# Patient Record
Sex: Male | Born: 1965 | Race: Black or African American | Hispanic: No | Marital: Married | Smoking: Former smoker
Health system: Southern US, Community
[De-identification: ages and names within clinical notes are randomized; demographics above are authoritative.]

## PROBLEM LIST (undated history)

## (undated) DIAGNOSIS — K219 Gastro-esophageal reflux disease without esophagitis: Secondary | ICD-10-CM

## (undated) DIAGNOSIS — I10 Essential (primary) hypertension: Secondary | ICD-10-CM

## (undated) DIAGNOSIS — E119 Type 2 diabetes mellitus without complications: Secondary | ICD-10-CM

## (undated) DIAGNOSIS — E7800 Pure hypercholesterolemia, unspecified: Secondary | ICD-10-CM

## (undated) HISTORY — PX: COLONOSCOPY: SHX174

---

## 2011-05-16 HISTORY — PX: INGUINAL HERNIA REPAIR: SUR1180

## 2012-07-02 ENCOUNTER — Emergency Department (HOSPITAL_COMMUNITY)
Admission: EM | Admit: 2012-07-02 | Discharge: 2012-07-02 | Disposition: A | Discharge status: Home / Self Care | Payer: BC Managed Care – PPO | Attending: Emergency Medicine | Admitting: Emergency Medicine

## 2012-07-02 ENCOUNTER — Encounter (HOSPITAL_COMMUNITY): Payer: Self-pay | Admitting: *Deleted

## 2012-07-02 DIAGNOSIS — Z87891 Personal history of nicotine dependence: Secondary | ICD-10-CM | POA: Insufficient documentation

## 2012-07-02 DIAGNOSIS — N492 Inflammatory disorders of scrotum: Secondary | ICD-10-CM

## 2012-07-02 DIAGNOSIS — Z79899 Other long term (current) drug therapy: Secondary | ICD-10-CM | POA: Insufficient documentation

## 2012-07-02 DIAGNOSIS — Z7982 Long term (current) use of aspirin: Secondary | ICD-10-CM | POA: Insufficient documentation

## 2012-07-02 DIAGNOSIS — E119 Type 2 diabetes mellitus without complications: Secondary | ICD-10-CM | POA: Insufficient documentation

## 2012-07-02 DIAGNOSIS — N454 Abscess of epididymis or testis: Secondary | ICD-10-CM | POA: Insufficient documentation

## 2012-07-02 NOTE — ED Notes (Signed)
Pt with L testicular swelling x 2 weeks, but in the last 3 days it has began swelling rapidly (pt states size of an egg at present), with redness and 2 "white heads".

## 2012-07-02 NOTE — ED Provider Notes (Signed)
History    This chart was scribed for non-physician practitioner working with [name], * by Toya Smothers, ED Scribe. This patient was seen in room TR08C/TR08C and the patient's care was started at 18:42.   CSN: [id number]  Arrival date & time 07/02/12  1737   First MD Initiated Contact with Patient 07/02/12 1836      Chief Complaint  Patient presents with  . Groin Swelling    Patient is a 47 y.o. male presenting with groin pain.  Groin Pain This is a new problem. The current episode started more than 1 week ago. The problem occurs constantly. The problem has been gradually worsening. The symptoms are aggravated by walking. Nothing relieves the symptoms. He has tried nothing for the symptoms. The treatment provided no relief.    Bradley Howe is a 47 y.o. male who presents to the Emergency Department complaining of 2 weeks of new, progressive, moderate left scrotal swelling, worsening in the past 3 days. Pain is described as soreness, aggravated with movement, and alleviated by nothing. Typically healthy at baseline, CC represents a moderate deviation. Symptoms have not been treated PTA. No penile pain or swelling, dysuria, abdominal pain, or fever. Pt is sexually active with one partner, is a former smoker, and denies alcohol and illicit drug use.   Past Medical History  Diagnosis Date  . Diabetes mellitus without complication     Past Surgical History  Procedure Laterality Date  . Hernia repair  2013    inguinal hernia    No family history on file.  History  Substance Use Topics  . Smoking status: Former Games developer  . Smokeless tobacco: Not on file  . Alcohol Use: Yes     Comment: weekends      Review of Systems  Constitutional: Negative for fever.  Genitourinary: Positive for scrotal swelling. Negative for difficulty urinating and penile pain.  All other systems reviewed and are negative.    Allergies  Review of patient's allergies indicates no known  allergies.  Home Medications   Current Outpatient Rx  Name  Route  Sig  Dispense  Refill  . aspirin 81 MG chewable tablet   Oral   Chew 81 mg by mouth daily.         Marland Kitchen atorvastatin (LIPITOR) 10 MG tablet   Oral   Take 10 mg by mouth daily.         . insulin NPH-insulin regular (NOVOLIN 70/30) (70-30) 100 UNIT/ML injection   Subcutaneous   Inject 15 Units into the skin 2 (two) times daily with a meal.         . metFORMIN (GLUCOPHAGE) 500 MG tablet   Oral   Take 500 mg by mouth 2 (two) times daily with a meal.         . simvastatin (ZOCOR) 10 MG tablet   Oral   Take 10 mg by mouth at bedtime.           BP 129/78  Pulse 95  Temp(Src) 98.9 F (37.2 C) (Oral)  Resp 16  SpO2 96%  Physical Exam  Nursing note and vitals reviewed. Constitutional: He is oriented to person, place, and time. He appears well-developed and well-nourished. No distress.  HENT:  Head: Normocephalic and atraumatic.  Eyes: EOM are normal.  Neck: Neck supple. No tracheal deviation present.  Cardiovascular: Normal rate.   Pulmonary/Chest: Effort normal. No respiratory distress.  Genitourinary: No penile tenderness.  A quarter sized area of induration on the left  side of the scrotum.  Musculoskeletal: Normal range of motion.  Neurological: He is alert and oriented to person, place, and time.  Skin: Skin is warm and dry.  Psychiatric: He has a normal mood and affect. His behavior is normal.    ED Course  Procedures DIAGNOSTIC STUDIES: Oxygen Saturation is 96% on room air, adequate by my interpretation.    COORDINATION OF CARE: 18:42- Evaluated Pt. Pt is awake, alert, and without distress. 18:49- Patient understand and agree with initial ED impression and plan with expectations set for ED visit.  Patient discussed with and seen by Dr. Blinda Leatherwood.  Labs Reviewed - No data to display No results found.   No diagnosis found.  INCISION AND DRAINAGE Performed by: Jimmye Norman Consent: Verbal consent obtained. Risks and benefits: risks, benefits and alternatives were discussed Type: abscess  Body area:  scrotum Anesthesia: local infiltration  Incision was made with a scalpel.  Local anesthetic: lidocaine 2%   Anesthetic total: 1 ml  Complexity: complex   Drainage: purulent  Drainage amount: copious   Patient tolerance: Patient tolerated the procedure well with no immediate complications.    MDM    I personally performed the services described in this documentation, which was scribed in my presence. The recorded information has been reviewed and is accurate.       Jimmye Norman, NP 07/02/12 2241

## 2012-07-03 NOTE — ED Provider Notes (Signed)
Medical screening examination/treatment/procedure(s) were performed by non-physician practitioner and as supervising physician I was immediately available for consultation/collaboration.  Christopher J. Pollina, MD 07/03/12 1217 

## 2012-07-26 DIAGNOSIS — I10 Essential (primary) hypertension: Secondary | ICD-10-CM | POA: Insufficient documentation

## 2012-09-20 ENCOUNTER — Encounter (HOSPITAL_COMMUNITY): Payer: Self-pay | Admitting: Emergency Medicine

## 2012-09-20 ENCOUNTER — Emergency Department (HOSPITAL_COMMUNITY)
Admission: EM | Admit: 2012-09-20 | Discharge: 2012-09-20 | Disposition: A | Discharge status: Home / Self Care | Payer: BC Managed Care – PPO | Attending: Emergency Medicine | Admitting: Emergency Medicine

## 2012-09-20 DIAGNOSIS — R5381 Other malaise: Secondary | ICD-10-CM | POA: Insufficient documentation

## 2012-09-20 DIAGNOSIS — Z794 Long term (current) use of insulin: Secondary | ICD-10-CM | POA: Insufficient documentation

## 2012-09-20 DIAGNOSIS — E162 Hypoglycemia, unspecified: Secondary | ICD-10-CM

## 2012-09-20 DIAGNOSIS — E1169 Type 2 diabetes mellitus with other specified complication: Secondary | ICD-10-CM | POA: Insufficient documentation

## 2012-09-20 DIAGNOSIS — Z7982 Long term (current) use of aspirin: Secondary | ICD-10-CM | POA: Insufficient documentation

## 2012-09-20 DIAGNOSIS — Z87891 Personal history of nicotine dependence: Secondary | ICD-10-CM | POA: Insufficient documentation

## 2012-09-20 DIAGNOSIS — Z79899 Other long term (current) drug therapy: Secondary | ICD-10-CM | POA: Insufficient documentation

## 2012-09-20 LAB — CBC WITH DIFFERENTIAL/PLATELET
Eosinophils Relative: 1 % (ref 0–5)
HCT: 38.9 % — ABNORMAL LOW (ref 39.0–52.0)
Hemoglobin: 13.6 g/dL (ref 13.0–17.0)
Lymphocytes Relative: 21 % (ref 12–46)
Lymphs Abs: 2 10*3/uL (ref 0.7–4.0)
MCH: 28.9 pg (ref 26.0–34.0)
MCV: 82.6 fL (ref 78.0–100.0)
Monocytes Absolute: 0.6 10*3/uL (ref 0.1–1.0)
Monocytes Relative: 6 % (ref 3–12)
RBC: 4.71 MIL/uL (ref 4.22–5.81)
WBC: 9.4 10*3/uL (ref 4.0–10.5)

## 2012-09-20 LAB — BASIC METABOLIC PANEL
BUN: 11 mg/dL (ref 6–23)
CO2: 22 mEq/L (ref 19–32)
Calcium: 9 mg/dL (ref 8.4–10.5)
Glucose, Bld: 89 mg/dL (ref 70–99)
Sodium: 137 mEq/L (ref 135–145)

## 2012-09-20 MED ORDER — SODIUM CHLORIDE 0.9 % IV BOLUS (SEPSIS)
1000.0000 mL | Freq: Once | INTRAVENOUS | Status: AC
  Administered 2012-09-20: 1000 mL via INTRAVENOUS

## 2012-09-20 NOTE — ED Notes (Signed)
STATES his sugars have been going up and down for no reason  X 2-3 days

## 2012-09-20 NOTE — ED Notes (Addendum)
Pt states his CBG has been going up and down. CBG 240 then dropped 35 and he was feeling weak. Pt states he takes 15 unit of insulin Novolin 70/30 x2 day. Pt states no changed in insulin or diet.

## 2012-09-20 NOTE — ED Provider Notes (Signed)
History     CSN: [id number]  Arrival date & time 09/20/12  1146   First MD Initiated Contact with Patient 09/20/12 1202      Chief Complaint  Patient presents with  . Diabetes    (Consider location/radiation/quality/duration/timing/severity/associated sxs/prior treatment) Patient is a 47 y.o. male presenting with weakness. The history is provided by the patient (pt states his glucose has been up and down). No language interpreter was used.  Weakness This is a new problem. The current episode started yesterday. The problem occurs constantly. The problem has been resolved. Pertinent negatives include no chest pain and no abdominal pain. Nothing aggravates the symptoms. Nothing relieves the symptoms.    Past Medical History  Diagnosis Date  . Diabetes mellitus without complication     Past Surgical History  Procedure Laterality Date  . Hernia repair  2013    inguinal hernia    No family history on file.  History  Substance Use Topics  . Smoking status: Former Games developer  . Smokeless tobacco: Not on file  . Alcohol Use: Yes     Comment: weekends      Review of Systems  Cardiovascular: Negative for chest pain.  Gastrointestinal: Negative for abdominal pain.  Neurological: Positive for weakness.    Allergies  Review of patient's allergies indicates no known allergies.  Home Medications   Current Outpatient Rx  Name  Route  Sig  Dispense  Refill  . aspirin 81 MG chewable tablet   Oral   Chew 81 mg by mouth daily.         Marland Kitchen atorvastatin (LIPITOR) 10 MG tablet   Oral   Take 10 mg by mouth daily.         . insulin NPH-insulin regular (NOVOLIN 70/30) (70-30) 100 UNIT/ML injection   Subcutaneous   Inject 15 Units into the skin 2 (two) times daily with a meal.         . metFORMIN (GLUCOPHAGE) 500 MG tablet   Oral   Take 500 mg by mouth 2 (two) times daily with a meal.         . simvastatin (ZOCOR) 10 MG tablet   Oral   Take 10 mg by mouth daily.           BP 112/75  Pulse 84  Temp(Src) 97.7 F (36.5 C)  Resp 16  SpO2 95%  Physical Exam  Constitutional: He is oriented to person, place, and time. He appears well-developed.  HENT:  Head: Normocephalic.  Eyes: Conjunctivae and EOM are normal. No scleral icterus.  Neck: Neck supple. No thyromegaly present.  Cardiovascular: Normal rate and regular rhythm.  Exam reveals no gallop and no friction rub.   No murmur heard. Pulmonary/Chest: No stridor. He has no wheezes. He has no rales. He exhibits no tenderness.  Abdominal: He exhibits no distension. There is no tenderness. There is no rebound.  Musculoskeletal: Normal range of motion. He exhibits no edema.  Lymphadenopathy:    He has no cervical adenopathy.  Neurological: He is oriented to person, place, and time. Coordination normal.  Skin: No rash noted. No erythema.  Psychiatric: He has a normal mood and affect. His behavior is normal.    ED Course  Procedures (including critical care time)  Labs Reviewed  CBC WITH DIFFERENTIAL - Abnormal; Notable for the following:    HCT 38.9 (*)    All other components within normal limits  BASIC METABOLIC PANEL - Abnormal; Notable for the following:  Potassium 3.3 (*)    GFR calc non Af Amer 62 (*)    GFR calc Af Amer 72 (*)    All other components within normal limits  GLUCOSE, CAPILLARY - Abnormal; Notable for the following:    Glucose-Capillary 103 (*)    All other components within normal limits  GLUCOSE, CAPILLARY   No results found.   1. Hypoglycemia       MDM          Benny Lennert, MD 09/20/12 1404

## 2012-09-20 NOTE — ED Notes (Signed)
CBG: 115 

## 2012-09-23 LAB — GLUCOSE, CAPILLARY: Glucose-Capillary: 115 mg/dL — ABNORMAL HIGH (ref 70–99)

## 2013-01-11 ENCOUNTER — Encounter (HOSPITAL_COMMUNITY): Payer: Self-pay

## 2013-01-11 ENCOUNTER — Emergency Department (HOSPITAL_COMMUNITY)
Admission: EM | Admit: 2013-01-11 | Discharge: 2013-01-11 | Disposition: A | Discharge status: Home / Self Care | Payer: Self-pay | Attending: Emergency Medicine | Admitting: Emergency Medicine

## 2013-01-11 DIAGNOSIS — R739 Hyperglycemia, unspecified: Secondary | ICD-10-CM

## 2013-01-11 DIAGNOSIS — R7309 Other abnormal glucose: Secondary | ICD-10-CM | POA: Insufficient documentation

## 2013-01-11 DIAGNOSIS — Z7982 Long term (current) use of aspirin: Secondary | ICD-10-CM | POA: Insufficient documentation

## 2013-01-11 DIAGNOSIS — R351 Nocturia: Secondary | ICD-10-CM | POA: Insufficient documentation

## 2013-01-11 DIAGNOSIS — Z794 Long term (current) use of insulin: Secondary | ICD-10-CM | POA: Insufficient documentation

## 2013-01-11 DIAGNOSIS — R631 Polydipsia: Secondary | ICD-10-CM | POA: Insufficient documentation

## 2013-01-11 DIAGNOSIS — Z79899 Other long term (current) drug therapy: Secondary | ICD-10-CM | POA: Insufficient documentation

## 2013-01-11 DIAGNOSIS — R350 Frequency of micturition: Secondary | ICD-10-CM | POA: Insufficient documentation

## 2013-01-11 DIAGNOSIS — Z87891 Personal history of nicotine dependence: Secondary | ICD-10-CM | POA: Insufficient documentation

## 2013-01-11 LAB — URINALYSIS, ROUTINE W REFLEX MICROSCOPIC
Bilirubin Urine: NEGATIVE
Hgb urine dipstick: NEGATIVE
Specific Gravity, Urine: 1.037 — ABNORMAL HIGH (ref 1.005–1.030)
pH: 6 (ref 5.0–8.0)

## 2013-01-11 LAB — CBC WITH DIFFERENTIAL/PLATELET
Eosinophils Absolute: 0.1 10*3/uL (ref 0.0–0.7)
HCT: 37.3 % — ABNORMAL LOW (ref 39.0–52.0)
Hemoglobin: 13.8 g/dL (ref 13.0–17.0)
Lymphs Abs: 2 10*3/uL (ref 0.7–4.0)
MCH: 29.5 pg (ref 26.0–34.0)
Monocytes Relative: 7 % (ref 3–12)
Neutro Abs: 3.8 10*3/uL (ref 1.7–7.7)
Neutrophils Relative %: 59 % (ref 43–77)
RBC: 4.68 MIL/uL (ref 4.22–5.81)

## 2013-01-11 LAB — BASIC METABOLIC PANEL
BUN: 13 mg/dL (ref 6–23)
Chloride: 90 mEq/L — ABNORMAL LOW (ref 96–112)
Glucose, Bld: 479 mg/dL — ABNORMAL HIGH (ref 70–99)
Potassium: 3.6 mEq/L (ref 3.5–5.1)

## 2013-01-11 LAB — GLUCOSE, CAPILLARY: Glucose-Capillary: 334 mg/dL — ABNORMAL HIGH (ref 70–99)

## 2013-01-11 MED ORDER — SODIUM CHLORIDE 0.9 % IV BOLUS (SEPSIS)
1000.0000 mL | Freq: Once | INTRAVENOUS | Status: AC
  Administered 2013-01-11: 1000 mL via INTRAVENOUS

## 2013-01-11 MED ORDER — INSULIN ASPART 100 UNIT/ML ~~LOC~~ SOLN
8.0000 [IU] | Freq: Once | SUBCUTANEOUS | Status: AC
  Administered 2013-01-11: 8 [IU] via INTRAVENOUS
  Filled 2013-01-11: qty 1

## 2013-01-11 NOTE — ED Notes (Signed)
Pt. Reports that his Blood sugar is over 500, he states, His insulin is not working for him,  Pt. Has been out of his insulin 70/30 for 3 days and metformin for 5 days.

## 2013-01-11 NOTE — ED Provider Notes (Signed)
CSN: [id number]     Arrival date & time 01/11/13  1643 History   First MD Initiated Contact with Patient 01/11/13 1702     Chief Complaint  Patient presents with  . Diabetes   (Consider location/radiation/quality/duration/timing/severity/associated sxs/prior Treatment) Patient is a 47 y.o. male presenting with diabetes problem. The history is provided by the patient. No language interpreter was used.  Diabetes Pertinent negatives include no abdominal pain, chills, fever, myalgias, nausea or sore throat. Associated symptoms comments: He presents to the ED with nocturia, increased thirst. He has a history of diabetes, on Meformin and 70/30 insulin (30 units BID) but has been unable to get insulin secondary to financial constraints for the past 3 days. No N, V, pain or recent fever or illness. .    Past Medical History  Diagnosis Date  . Diabetes mellitus without complication    Past Surgical History  Procedure Laterality Date  . Hernia repair  2013    inguinal hernia   No family history on file. History  Substance Use Topics  . Smoking status: Former Games developer  . Smokeless tobacco: Not on file  . Alcohol Use: Yes     Comment: weekends    Review of Systems  Constitutional: Negative for fever, chills and unexpected weight change.  HENT: Negative.  Negative for sore throat.   Respiratory: Negative.   Cardiovascular: Negative.   Gastrointestinal: Negative.  Negative for nausea and abdominal pain.  Endocrine: Positive for polydipsia and polyuria.  Genitourinary: Positive for frequency.  Musculoskeletal: Negative.  Negative for myalgias.  Skin: Negative.   Neurological: Negative.     Allergies  Review of patient's allergies indicates no known allergies.  Home Medications   Current Outpatient Rx  Name  Route  Sig  Dispense  Refill  . aspirin 81 MG chewable tablet   Oral   Chew 81 mg by mouth daily.         . insulin NPH-insulin regular (NOVOLIN 70/30) (70-30) 100 UNIT/ML  injection   Subcutaneous   Inject 30 Units into the skin 2 (two) times daily with a meal.          . metFORMIN (GLUCOPHAGE) 500 MG tablet   Oral   Take 500 mg by mouth 2 (two) times daily with a meal.         . simvastatin (ZOCOR) 10 MG tablet   Oral   Take 10 mg by mouth daily.           BP 124/75  Pulse 81  Temp(Src) 98.2 F (36.8 C) (Oral)  Resp 20  Ht 5' 6.5" (1.689 m)  Wt 225 lb (102.059 kg)  BMI 35.78 kg/m2  SpO2 97% Physical Exam  Constitutional: He is oriented to person, place, and time. He appears well-developed and well-nourished.  HENT:  Head: Normocephalic.  Mouth/Throat: Mucous membranes are dry.  Neck: Normal range of motion. Neck supple.  Cardiovascular: Normal rate and regular rhythm.   Pulmonary/Chest: Effort normal and breath sounds normal.  Abdominal: Soft. Bowel sounds are normal. There is no tenderness. There is no rebound and no guarding.  Musculoskeletal: Normal range of motion.  Neurological: He is alert and oriented to person, place, and time.  Skin: Skin is warm and dry. No rash noted.  Psychiatric: He has a normal mood and affect.    ED Course  Procedures (including critical care time) Labs Review Labs Reviewed  GLUCOSE, CAPILLARY - Abnormal; Notable for the following:    Glucose-Capillary >600 (*)  All other components within normal limits  CBC WITH DIFFERENTIAL - Abnormal; Notable for the following:    HCT 37.3 (*)    MCHC 37.0 (*)    All other components within normal limits  BASIC METABOLIC PANEL - Abnormal; Notable for the following:    Sodium 127 (*)    Chloride 90 (*)    Glucose, Bld 479 (*)    GFR calc non Af Amer 72 (*)    GFR calc Af Amer 84 (*)    All other components within normal limits  URINALYSIS, ROUTINE W REFLEX MICROSCOPIC - Abnormal; Notable for the following:    Specific Gravity, Urine 1.037 (*)    Glucose, UA >1000 (*)    Ketones, ur 15 (*)    All other components within normal limits  GLUCOSE,  CAPILLARY - Abnormal; Notable for the following:    Glucose-Capillary 334 (*)    All other components within normal limits  URINE MICROSCOPIC-ADD ON   Imaging Review No results found.  MDM  No diagnosis found. 1. Hyperglycemia  IV and oral fluids given. Insulin, 8 units, with reduction in blood sugar to 336. No evidence of DKA. Discussed follow up with [facility].    Arnoldo Hooker, PA-C 01/11/13 2227

## 2013-01-11 NOTE — ED Provider Notes (Signed)
Medical screening examination/treatment/procedure(s) were performed by non-physician practitioner and as supervising physician I was immediately available for consultation/collaboration.  Flint Melter, MD 01/11/13 2308

## 2013-01-11 NOTE — ED Notes (Signed)
Pt states he has been out of insulin for several days. Now states dry mouth and frequent urination. Denies pain. States he does not have insurance and is unable to afford insulin at the time. No signs of distress noted. Pt is alert and oriented x4.

## 2013-01-15 ENCOUNTER — Ambulatory Visit: Payer: Self-pay | Attending: Internal Medicine | Admitting: Internal Medicine

## 2013-01-15 VITALS — BP 124/83 | HR 82 | Temp 97.8°F | Resp 16 | Wt 219.0 lb

## 2013-01-15 DIAGNOSIS — Z79899 Other long term (current) drug therapy: Secondary | ICD-10-CM | POA: Insufficient documentation

## 2013-01-15 DIAGNOSIS — IMO0001 Reserved for inherently not codable concepts without codable children: Secondary | ICD-10-CM

## 2013-01-15 DIAGNOSIS — Z598 Other problems related to housing and economic circumstances: Secondary | ICD-10-CM | POA: Insufficient documentation

## 2013-01-15 DIAGNOSIS — E1165 Type 2 diabetes mellitus with hyperglycemia: Secondary | ICD-10-CM | POA: Insufficient documentation

## 2013-01-15 DIAGNOSIS — Z7982 Long term (current) use of aspirin: Secondary | ICD-10-CM | POA: Insufficient documentation

## 2013-01-15 DIAGNOSIS — Z794 Long term (current) use of insulin: Secondary | ICD-10-CM | POA: Insufficient documentation

## 2013-01-15 DIAGNOSIS — R631 Polydipsia: Secondary | ICD-10-CM | POA: Insufficient documentation

## 2013-01-15 DIAGNOSIS — E1310 Other specified diabetes mellitus with ketoacidosis without coma: Secondary | ICD-10-CM

## 2013-01-15 DIAGNOSIS — Z0000 Encounter for general adult medical examination without abnormal findings: Secondary | ICD-10-CM

## 2013-01-15 DIAGNOSIS — E1110 Type 2 diabetes mellitus with ketoacidosis without coma: Secondary | ICD-10-CM

## 2013-01-15 DIAGNOSIS — E119 Type 2 diabetes mellitus without complications: Secondary | ICD-10-CM | POA: Insufficient documentation

## 2013-01-15 DIAGNOSIS — R3589 Other polyuria: Secondary | ICD-10-CM | POA: Insufficient documentation

## 2013-01-15 DIAGNOSIS — R358 Other polyuria: Secondary | ICD-10-CM | POA: Insufficient documentation

## 2013-01-15 DIAGNOSIS — Z5987 Material hardship due to limited financial resources, not elsewhere classified: Secondary | ICD-10-CM | POA: Insufficient documentation

## 2013-01-15 DIAGNOSIS — E7800 Pure hypercholesterolemia, unspecified: Secondary | ICD-10-CM

## 2013-01-15 LAB — POCT GLYCOSYLATED HEMOGLOBIN (HGB A1C): Hemoglobin A1C: >=14

## 2013-01-15 MED ORDER — PRAVASTATIN SODIUM 40 MG PO TABS: 40.0000 mg | ORAL_TABLET | Freq: Every day | ORAL | Status: DC

## 2013-01-15 MED ORDER — INSULIN NPH ISOPHANE & REGULAR (70-30) 100 UNIT/ML ~~LOC~~ SUSP: 30.0000 [IU] | Freq: Two times a day (BID) | SUBCUTANEOUS | Status: DC

## 2013-01-15 MED ORDER — METFORMIN HCL 500 MG PO TABS: 500.0000 mg | ORAL_TABLET | Freq: Two times a day (BID) | ORAL | Status: DC

## 2013-01-15 NOTE — Progress Notes (Signed)
Patient ID: Bradley Howe, male   DOB: 02/16/1966, 47 y.o.   MRN: [id number]  CC:  HPI:  Bradley Howe is a 47 year old man with PMH of diabetes and hyperlipidemia who was seen and evaluated in the ED on 01/11/2013 for evaluation of nocturia and polydipsia. He was treated with IV and oral fluids, given 8 units of NovoLog, and referred here for followup. Point-of-care glucose testing to high for meter to read. The patient tells me he has not had his insulin in about a month secondary to financial issues. Is also not taking his metformin or simvastatin. Over the past 3 weeks, he has had worsening polyuria and polydipsia. His energy level is low.  No Known Allergies Past Medical History  Diagnosis Date  . Diabetes mellitus without complication    Current Outpatient Prescriptions on File Prior to Visit  Medication Sig Dispense Refill  . aspirin 81 MG chewable tablet Chew 81 mg by mouth daily.      . insulin NPH-insulin regular (NOVOLIN 70/30) (70-30) 100 UNIT/ML injection Inject 30 Units into the skin 2 (two) times daily with a meal.       . metFORMIN (GLUCOPHAGE) 500 MG tablet Take 500 mg by mouth 2 (two) times daily with a meal.      . simvastatin (ZOCOR) 10 MG tablet Take 10 mg by mouth daily.        No current facility-administered medications on file prior to visit.   History reviewed. No pertinent family history. History   Social History  . Marital Status: Married    Spouse Name: N/A    Number of Children: N/A  . Years of Education: N/A   Occupational History  . Not on file.   Social History Main Topics  . Smoking status: Former Games developer  . Smokeless tobacco: Not on file  . Alcohol Use: Yes     Comment: weekends  . Drug Use: No  . Sexual Activity: Not on file   Other Topics Concern  . Not on file   Social History Narrative  . No narrative on file    Review of Systems: Constitutional: No fever or chills;  Appetite normal; No weight loss, + fatigue.  HEENT: No blurry vision or  diplopia, no pharyngitis or dysphagia CV: No chest pain or arrhythmia.  Resp: No SOB, no cough. GI: No N/V, no diarrhea, no melena or hematochezia.  GU: No dysuria or hematuria.  MSK: no myalgias/arthralgias.  Neuro:  No headache or focal neurological deficits.  Psych: No depression or anxiety.  Endo: + Polyuria and polydipsia Skin: No rashes or lesions.  Heme: No anemia or blood dyscrasia   Objective:   Filed Vitals:   01/15/13 1654  BP: 124/83  Pulse: 82  Temp: 97.8 F (36.6 C)  Resp: 16    Physical Exam  Constitutional: Appears well-developed and well-nourished. No distress.  HENT: Normocephalic. External right and left ear normal. Oropharynx is clear and moist.  Eyes: Conjunctivae and EOM are normal. PERRLA, no scleral icterus.  Neck: Normal ROM. Neck supple. No JVD. No tracheal deviation. No thyromegaly.  CVS: RRR, S1/S2 +, no murmurs, no gallops, no carotid bruit.  Pulmonary: Effort and breath sounds normal, no stridor, rhonchi, wheezes, rales.  Abdominal: Soft. BS +,  no distension, tenderness, rebound or guarding. Rectal: Normal prostate.  Musculoskeletal: Normal range of motion. No edema and no tenderness.  Neuro: Alert. Normal reflexes, muscle tone coordination. No cranial nerve deficit. Skin: Skin is warm and dry. No rash noted.  Not diaphoretic. No erythema. No pallor.  Psychiatric: Normal mood and affect. Behavior, judgment, thought content normal.   Lab Results  Component Value Date   WBC 6.3 01/11/2013   HGB 13.8 01/11/2013   HCT 37.3* 01/11/2013   MCV 79.7 01/11/2013   PLT 233 01/11/2013   Lab Results  Component Value Date   CREATININE 1.17 01/11/2013   BUN 13 01/11/2013   NA 127* 01/11/2013   K 3.6 01/11/2013   CL 90* 01/11/2013   CO2 24 01/11/2013    Assessment and plan:  1. Uncontrolled type 2 diabetes: Patient was given 10 units of NovoLog subcutaneously. He was provided with samples of NovoLog to use as a stop-gap measure to keep him from going into  hyperosmolar or ketoacidotic state given his uncontrolled blood glucoses. He was instructed to take 10 units of NovoLog before meals if his sugar was greater than 300, and 5 units if his sugar was less than 300. A social work appointment was set up to try to get him into a medication assistance program. He was instructed to refill his 70/30 insulin as soon as he could afford to do so. We will check a urine/microalbumin ratio. Followup hemoglobin A1c and serum glucose. 2. Hypercholesterolemia: Patient was provided with a prescription for Pravachol (Zocor too expensive). He will return for fasting lipid panel later this week.  Routine Health Maintenance   Ophthalmology Exam:  Scheduled.  Lipid Screening Q 5 years: Scheduled this week.  DM Screening >45 Q 3 years:  Hemoglobin A1c done.  Diabetic foot exam: 01/16/13.  DRE/PSA age 59 in [place], 11 otherwise: 01/16/2013.  HTN Annually: 01/16/13  RAMA,CHRISTINA 01/15/2013 5:34 PM

## 2013-01-15 NOTE — Progress Notes (Signed)
Patient recently seen in the ED for elevated blood sugar Has not had any of his medications in over a month Today in office glucometer could not get a reading stating it was "HI"

## 2013-01-15 NOTE — Patient Instructions (Signed)
Your sugars are dangerously high. We have set you up to see a social worker to obtain assistance with obtaining her medications. In the meantime, we have provided you with NovoLog to keep your sugars from being dangerously high. Check your sugars before each meal and if they are greater than 300, give yourself 10 units of NovoLog. If they are less than 300, give yourself 5 units of NovoLog. This is a temporary weight to keep your sugars down and is not intended for long-term management of your diabetes.  We have set you up with further blood testing to be done on a fasting sample later in the week.

## 2013-01-16 ENCOUNTER — Telehealth: Payer: Self-pay | Admitting: Internal Medicine

## 2013-01-16 LAB — BASIC METABOLIC PANEL
Calcium: 10.2 mg/dL (ref 8.4–10.5)
Sodium: 120 mEq/L — ABNORMAL LOW (ref 135–145)

## 2013-01-16 NOTE — Telephone Encounter (Signed)
Patient sent to urgent care for further evaluations and management

## 2013-01-16 NOTE — Telephone Encounter (Signed)
Pt has 693 glucose level; test was repeated and verified

## 2013-01-22 ENCOUNTER — Telehealth: Payer: Self-pay | Admitting: Emergency Medicine

## 2013-01-22 ENCOUNTER — Other Ambulatory Visit: Payer: Self-pay

## 2013-01-22 NOTE — Progress Notes (Signed)
Quick Note:  Please make sure this patient has a follow up visit with the social worker and comes back to the clinic for a re-check of his DM. He was given a novaLOG pen at his previous visit, but this was a temporary measure to keep him from going into DKA. He needs a way to obtain his insulin.  Raiya Stainback 01/22/2013 4:20 PM  ______

## 2013-01-22 NOTE — Telephone Encounter (Signed)
MESSAGE LEFT FOR PT TO Call CLINIC FOR LAB RESULTS

## 2013-01-22 NOTE — Telephone Encounter (Signed)
Message copied by Darlis Loan on Wed Jan 22, 2013  5:02 PM ------      Message from: RAMA, Trula Ore P      Created: Wed Jan 22, 2013  4:20 PM       Please make sure this patient has a follow up visit with the social worker and comes back to the clinic for a re-check of his DM.  He was given a novaLOG pen at his previous visit, but this was a temporary measure to keep him from going into DKA.  He needs a way to obtain his insulin.            RAMA,CHRISTINA      01/22/2013      4:20 PM       ------

## 2013-01-24 ENCOUNTER — Ambulatory Visit: Payer: Self-pay

## 2013-01-27 ENCOUNTER — Telehealth: Payer: Self-pay | Admitting: Emergency Medicine

## 2013-01-27 NOTE — Telephone Encounter (Signed)
PT called for f/u concerning insulin needs with Child psychotherapist. Pt states he was seen by Tywan and resources given. Has f/u appt with labs next week.

## 2013-01-31 ENCOUNTER — Ambulatory Visit: Payer: Self-pay

## 2013-02-05 ENCOUNTER — Ambulatory Visit: Payer: Self-pay

## 2013-03-20 ENCOUNTER — Other Ambulatory Visit: Payer: Self-pay

## 2015-05-27 ENCOUNTER — Ambulatory Visit: Payer: Self-pay | Admitting: Internal Medicine

## 2015-09-17 ENCOUNTER — Observation Stay (HOSPITAL_COMMUNITY)
Admission: EM | Admit: 2015-09-17 | Discharge: 2015-09-18 | Disposition: A | Discharge status: Home / Self Care | Payer: Self-pay | Attending: Internal Medicine | Admitting: Internal Medicine

## 2015-09-17 ENCOUNTER — Encounter (HOSPITAL_COMMUNITY): Payer: Self-pay

## 2015-09-17 ENCOUNTER — Emergency Department (HOSPITAL_COMMUNITY): Payer: Self-pay

## 2015-09-17 DIAGNOSIS — R0789 Other chest pain: Secondary | ICD-10-CM

## 2015-09-17 DIAGNOSIS — R079 Chest pain, unspecified: Secondary | ICD-10-CM | POA: Diagnosis present

## 2015-09-17 DIAGNOSIS — F1410 Cocaine abuse, uncomplicated: Secondary | ICD-10-CM

## 2015-09-17 DIAGNOSIS — Z87891 Personal history of nicotine dependence: Secondary | ICD-10-CM | POA: Insufficient documentation

## 2015-09-17 DIAGNOSIS — Z7984 Long term (current) use of oral hypoglycemic drugs: Secondary | ICD-10-CM | POA: Insufficient documentation

## 2015-09-17 DIAGNOSIS — I209 Angina pectoris, unspecified: Principal | ICD-10-CM | POA: Insufficient documentation

## 2015-09-17 DIAGNOSIS — E7800 Pure hypercholesterolemia, unspecified: Secondary | ICD-10-CM | POA: Diagnosis present

## 2015-09-17 DIAGNOSIS — Z7982 Long term (current) use of aspirin: Secondary | ICD-10-CM | POA: Insufficient documentation

## 2015-09-17 DIAGNOSIS — Z794 Long term (current) use of insulin: Secondary | ICD-10-CM | POA: Insufficient documentation

## 2015-09-17 DIAGNOSIS — E119 Type 2 diabetes mellitus without complications: Secondary | ICD-10-CM | POA: Insufficient documentation

## 2015-09-17 DIAGNOSIS — E1165 Type 2 diabetes mellitus with hyperglycemia: Secondary | ICD-10-CM

## 2015-09-17 HISTORY — DX: Type 2 diabetes mellitus without complications: E11.9

## 2015-09-17 HISTORY — DX: Gastro-esophageal reflux disease without esophagitis: K21.9

## 2015-09-17 HISTORY — DX: Essential (primary) hypertension: I10

## 2015-09-17 HISTORY — DX: Pure hypercholesterolemia, unspecified: E78.00

## 2015-09-17 LAB — BASIC METABOLIC PANEL
ANION GAP: 15 (ref 5–15)
BUN: <5 mg/dL — ABNORMAL LOW (ref 6–20)
CHLORIDE: 98 mmol/L — AB (ref 101–111)
CO2: 21 mmol/L — ABNORMAL LOW (ref 22–32)
Calcium: 8.9 mg/dL (ref 8.9–10.3)
Creatinine, Ser: 0.98 mg/dL (ref 0.61–1.24)
Glucose, Bld: 243 mg/dL — ABNORMAL HIGH (ref 65–99)
Potassium: 3.6 mmol/L (ref 3.5–5.1)
SODIUM: 134 mmol/L — AB (ref 135–145)

## 2015-09-17 LAB — CBC
HEMATOCRIT: 38.4 % — AB (ref 39.0–52.0)
HEMOGLOBIN: 13.4 g/dL (ref 13.0–17.0)
MCH: 29.5 pg (ref 26.0–34.0)
MCHC: 34.9 g/dL (ref 30.0–36.0)
MCV: 84.4 fL (ref 78.0–100.0)
Platelets: 220 10*3/uL (ref 150–400)
RBC: 4.55 MIL/uL (ref 4.22–5.81)
RDW: 13.6 % (ref 11.5–15.5)
WBC: 8.4 10*3/uL (ref 4.0–10.5)

## 2015-09-17 LAB — HEPATIC FUNCTION PANEL
ALK PHOS: 54 U/L (ref 38–126)
ALT: 28 U/L (ref 17–63)
AST: 34 U/L (ref 15–41)
Albumin: 3.6 g/dL (ref 3.5–5.0)
BILIRUBIN INDIRECT: 0.5 mg/dL (ref 0.3–0.9)
Bilirubin, Direct: 0.1 mg/dL (ref 0.1–0.5)
TOTAL PROTEIN: 6.3 g/dL — AB (ref 6.5–8.1)
Total Bilirubin: 0.6 mg/dL (ref 0.3–1.2)

## 2015-09-17 LAB — RAPID URINE DRUG SCREEN, HOSP PERFORMED
Amphetamines: NOT DETECTED
BARBITURATES: NOT DETECTED
BENZODIAZEPINES: NOT DETECTED
COCAINE: POSITIVE — AB
Opiates: NOT DETECTED
Tetrahydrocannabinol: NOT DETECTED

## 2015-09-17 LAB — LIPID PANEL
CHOLESTEROL: 175 mg/dL (ref 0–200)
HDL: 29 mg/dL — ABNORMAL LOW (ref >40)
LDL Cholesterol: 82 mg/dL (ref 0–99)
TRIGLYCERIDES: 318 mg/dL — AB (ref <150)
Total CHOL/HDL Ratio: 6 RATIO
VLDL: 64 mg/dL — AB (ref 0–40)

## 2015-09-17 LAB — I-STAT TROPONIN, ED: Troponin i, poc: 0.04 ng/mL (ref 0.00–0.08)

## 2015-09-17 LAB — D-DIMER, QUANTITATIVE: D-Dimer, Quant: <0.27 ug/mL-FEU (ref 0.00–0.50)

## 2015-09-17 LAB — GLUCOSE, CAPILLARY
GLUCOSE-CAPILLARY: 337 mg/dL — AB (ref 65–99)
Glucose-Capillary: 228 mg/dL — ABNORMAL HIGH (ref 65–99)

## 2015-09-17 LAB — TROPONIN I: TROPONIN I: 0.03 ng/mL (ref <0.031)

## 2015-09-17 MED ORDER — SODIUM CHLORIDE 0.9% FLUSH: 3.0000 mL | INTRAVENOUS | Status: DC | PRN

## 2015-09-17 MED ORDER — INSULIN ASPART 100 UNIT/ML ~~LOC~~ SOLN
0.0000 [IU] | Freq: Three times a day (TID) | SUBCUTANEOUS | Status: DC
  Administered 2015-09-17: 3 [IU] via SUBCUTANEOUS

## 2015-09-17 MED ORDER — ASPIRIN EC 81 MG PO TBEC
81.0000 mg | DELAYED_RELEASE_TABLET | Freq: Every day | ORAL | Status: DC
  Administered 2015-09-18: 81 mg via ORAL
  Filled 2015-09-17: qty 1

## 2015-09-17 MED ORDER — INSULIN ASPART PROT & ASPART (70-30 MIX) 100 UNIT/ML ~~LOC~~ SUSP
30.0000 [IU] | Freq: Two times a day (BID) | SUBCUTANEOUS | Status: DC
  Administered 2015-09-17 – 2015-09-18 (×2): 30 [IU] via SUBCUTANEOUS
  Filled 2015-09-17: qty 10

## 2015-09-17 MED ORDER — ENOXAPARIN SODIUM 40 MG/0.4ML ~~LOC~~ SOLN
40.0000 mg | SUBCUTANEOUS | Status: DC
  Administered 2015-09-17: 40 mg via SUBCUTANEOUS
  Filled 2015-09-17: qty 0.4

## 2015-09-17 MED ORDER — ENSURE ENLIVE PO LIQD: 237.0000 mL | Freq: Two times a day (BID) | ORAL | Status: DC

## 2015-09-17 MED ORDER — ASPIRIN 81 MG PO CHEW
324.0000 mg | CHEWABLE_TABLET | Freq: Once | ORAL | Status: AC
  Administered 2015-09-17: 324 mg via ORAL
  Filled 2015-09-17: qty 4

## 2015-09-17 MED ORDER — SODIUM CHLORIDE 0.9% FLUSH
3.0000 mL | Freq: Two times a day (BID) | INTRAVENOUS | Status: DC
  Administered 2015-09-17: 3 mL via INTRAVENOUS

## 2015-09-17 NOTE — H&P (Signed)
Date: 09/17/2015               Patient Name:  Bradley Howe MRN: [id number]  DOB: Aug 06, 1965 Age / Sex: 50 y.o., male   PCP: No primary care provider on file.         Medical Service: Internal Medicine Teaching Service         Attending Physician: Dr. Aldine Contes, MD    First Contact: Dr. Jule Ser Pager: [phone]  Second Contact: Dr. 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  CP was sharp 6/10 on left and right chest.  Associated with dyspnea, diaphoresis.  Lasted several hours.  He was able to continue working, but at a slower pace due to [place].  The chest pain resolved on its own.  Last night he woke up with the same pain.  He describes sharp pain but more intense, 9/10 and radiating into back and associated with dyspnea.  His pillow was soaked with sweat.  He was able to drive himself to the hospital. The pain lasted a few hours but has lessened in intensity, currently 4/10. This has never happened before.  He has reflux but this feels different.  He denies recent URI or flu symptoms, lightheadedness, recent long car/plane trips, surgery, no new medications, no tobacco/drug use.  He drinks beer socially.  He reports compliance with Novolin 70/30 50 units BID, metformin 1000mg  daily.  Has not taken pravastatin in 6 months because of cost.    Meds: No current facility-administered medications for this encounter.   Current Outpatient Prescriptions  Medication Sig Dispense Refill  . insulin NPH-regular (NOVOLIN 70/30) (70-30) 100 UNIT/ML injection Inject 30 Units into the skin 2 (two) times daily with a meal. (Patient taking differently: Inject 50 Units into the skin 2 (two) times daily  with a meal. ) 10 mL 6  . metFORMIN (GLUCOPHAGE) 500 MG tablet Take 1 tablet (500 mg total) by mouth 2 (two) times daily with a meal. (Patient not taking: Reported on 09/17/2015) 60 tablet 6  . pravastatin (PRAVACHOL) 40 MG tablet Take 1 tablet (40 mg total) by mouth daily. (Patient not taking: Reported on 09/17/2015) 30 tablet 6    Allergies: Allergies as of 09/17/2015  . (No Known Allergies)   Past Medical History  Diagnosis Date  . Diabetes mellitus without complication ([place])   . Hypertension    Past Surgical History  Procedure Laterality Date  . Hernia repair  2013    inguinal hernia   Family History  Problem Relation Age of Onset  . Heart disease Neg Hx   . Hypertension Mother   . Hypertension Father   . Diabetes Paternal Grandmother    Social History   Social History  . Marital Status: Married    Spouse Name: N/A  . Number of Children: N/A  . Years of Education: N/A   Occupational History  . Not on file.   Social History Main Topics  . Smoking status: Former Research scientist (life sciences)  . Smokeless tobacco: Not on file  . Alcohol Use: Yes     Comment: weekends  . Drug Use: No  . Sexual Activity: Not on  file   Other Topics Concern  . Not on file   Social History Narrative   Review of Systems: General:  Denies fever; + fatigue and lost 25 lbs in past year Cards:  Per HPI Pulm:  Per HPI GI:  Denies N/V or abdominal pain; reports diarrhea last night GU:  Denies difficulty urinating, hematuria, dysuria Neuro:  Denies weakness, ambulatory dysfunction  Physical Exam: Blood pressure 116/74, pulse 88, temperature 98.1 F (36.7 C), temperature source Oral, resp. rate 15, height 5\' 7"  (1.702 m), weight 216 lb (97.977 kg), SpO2 98 %. General: resting in bed in NAD HEENT: PERRL, EOMI, no scleral icterus, no JVD Cardiac: RRR, no rubs, murmurs or gallops, 2+ carotids, distal pulses intact Pulm: clear to auscultation bilaterally, moving normal volumes of air Abd: soft, nontender,  nondistended, BS present Ext: warm and well perfused, no pedal edema Neuro: alert and oriented X3, cranial nerves II-XII grossly intact, 5/5 MMS, sensation grossly intact and equal  Lab results: Basic Metabolic Panel:  Recent Labs  09/17/15 0755  NA 134*  K 3.6  CL 98*  CO2 21*  GLUCOSE 243*  BUN <5*  CREATININE 0.98  CALCIUM 8.9   CBC:  Recent Labs  09/17/15 0755  WBC 8.4  HGB 13.4  HCT 38.4*  MCV 84.4  PLT 220   Cardiac Enzymes:  Recent Labs  09/17/15 0906  TROPONINI 0.03   Fasting Lipid Panel:  Recent Labs  09/17/15 0906  CHOL 175  HDL 29*  LDLCALC 82  TRIG 318*  CHOLHDL 6.0   Urine Drug Screen: Drugs of Abuse     Component Value Date/Time   LABOPIA NONE DETECTED 09/17/2015 1051   COCAINSCRNUR POSITIVE* 09/17/2015 1051   LABBENZ NONE DETECTED 09/17/2015 1051   AMPHETMU NONE DETECTED 09/17/2015 1051   THCU NONE DETECTED 09/17/2015 1051   LABBARB NONE DETECTED 09/17/2015 1051     Imaging results:  Dg Chest 2 View  09/17/2015  CLINICAL DATA:  Chest pain since last night. EXAM: CHEST  2 VIEW COMPARISON:  None. FINDINGS: The heart size and mediastinal contours are within normal limits. Both lungs are clear. The visualized skeletal structures are unremarkable. IMPRESSION: No active cardiopulmonary disease. Electronically Signed   By: Rolm Baptise M.D.   On: 09/17/2015 08:07    Other results: EKG: NSR, 93 bpm, ST changes reflecting early repolarization  Assessment & Plan by Problem:  Chest pain:  Atypical description (sharp, across chest, lasting several hours and then resolving spontaneously).  Radiates to back but he appears comfortable on exam, no tearing sensation described, peripheral pulses intact, no neuro deficits, no mediastinal widening on CXR.  No pleuritic or positional change in CP, friction rub, recent URI or flu symptoms to suggest pericarditis.  The ST changes on EKG likely reflect early repol.  He denies drug use but UDS is positive  for cocaine. - admit to telemetry for ACS r/o - prn supplemental oxygen - add daily ASA - trend troponin x 3 - check BP in both arms, check d-dimer (r/o dissection, though this is likely) - check lipid panel and A1c for further risk stratification - he needs to be back on a statin given DM and HLD - AM EKG - Cards consult if troponin trend up, EKG changes  Type 2 diabetes mellitus without complications  He reports last A1c was 11.5 at his PCP several months. - hold metformin - continue Novolin 70/30 at reduced dose since he will be on carb mod diet here -  checking A1c  Hypercholesterolemia - check lipid panel, he needs to resume statin, likely needs high intensity  Cocaine use:  UDS + cocaine. - social work consult - advise cessation  Diet:  Carb mod VTE ppx:  [place] lovenox Code status:  Full  Dispo: Disposition is deferred at this time, awaiting improvement of current medical problems. Anticipated discharge in approximately 1-2 day(s).   The patient does not have a current PCP (No primary care provider on file.) and may need an [facility] hospital follow-up appointment after discharge.  The patient does not have transportation limitations that hinder transportation to clinic appointments.  Signed: Francesca Oman, DO 09/17/2015, 8:57 AM

## 2015-09-17 NOTE — ED Notes (Signed)
Pt reports a sudden sharp pain that shot down from his left chest down to his leg that lasted for a couple minutes and was a sharp 10/10 pain. Pt reports it decreasing and then continuing at about a 4/10 pain. Continues to report 5/10 right sided chest pain.

## 2015-09-17 NOTE — ED Notes (Signed)
Per Pt, Pt is coming from home. Pt started to have right sided chest pain that was radiating to the back yesterday morning at work. Pt reports the pain continued to get worse throughout the day and last night. Pt had diarrhea three times last night and reports feeling Short of Breath. No hx of the same. Hx of DM, HTN, and High Cholesterol. Pt alert and oriented x4. No acute distress at this time.

## 2015-09-17 NOTE — ED Provider Notes (Addendum)
CSN: [id number]     Arrival date & time 09/17/15  [id number] History   First MD Initiated Contact with Patient 09/17/15 [phone]     Chief Complaint  Patient presents with  . Chest Pain     (Consider location/radiation/quality/duration/timing/severity/associated sxs/prior Treatment) HPI Comments: Pt with hx of iddm, htn, hl comes in with cc of chest pain. Chest pain is midsternal, radiating to the L side, back and R side. Pain is described as constant soreness with waxing and waning sharp pains that started y'day. Pain has no specific aggravating or relieving factor - not pleuritic, exertional. Pt has no hx of pain like this in the past. He reports associated dib, with exertion mainly. Pt denies nausea. Noted wet pillow and sweats when he woke up, but unsure if it was directly associated with chest pain. Pt also has had loose BM x 3 overnight. No abd pain. No emesis. No fevers. Denies smoking or drug use.   ROS 10 Systems reviewed and are negative for acute change except as noted in the HPI.     Patient is a 50 y.o. male presenting with chest pain. The history is provided by the patient.  Chest Pain   Past Medical History  Diagnosis Date  . Diabetes mellitus without complication [facility])    Past Surgical History  Procedure Laterality Date  . Hernia repair  2013    inguinal hernia   No family history on file. Social History  Substance Use Topics  . Smoking status: Former Research scientist (life sciences)  . Smokeless tobacco: None  . Alcohol Use: Yes     Comment: weekends    Review of Systems  Cardiovascular: Positive for chest pain.      Allergies  Review of patient's allergies indicates no known allergies.  Home Medications   Prior to Admission medications   Medication Sig Start Date End Date Taking? Authorizing Provider  aspirin 81 MG chewable tablet Chew 81 mg by mouth daily.    Historical Provider, MD  insulin NPH-regular (NOVOLIN 70/30) (70-30) 100 UNIT/ML injection Inject 30 Units into the skin  2 (two) times daily with a meal. 01/15/13   Venetia Maxon Rama, MD  metFORMIN (GLUCOPHAGE) 500 MG tablet Take 1 tablet (500 mg total) by mouth 2 (two) times daily with a meal. 01/15/13   Christina P Rama, MD  pravastatin (PRAVACHOL) 40 MG tablet Take 1 tablet (40 mg total) by mouth daily. 01/15/13   Christina P Rama, MD   BP 127/85 mmHg  Pulse 96  Temp(Src) 98.1 F (36.7 C) (Oral)  Resp 18  Ht 5\' 7"  (1.702 m)  Wt 216 lb (97.977 kg)  BMI 33.82 kg/m2  SpO2 96% Physical Exam  Constitutional: He is oriented to person, place, and time. He appears well-developed.  HENT:  Head: Atraumatic.  Neck: Neck supple.  Cardiovascular: Normal rate.   Pulmonary/Chest: Effort normal.  Abdominal: Soft. He exhibits no distension. There is no tenderness.  Neurological: He is alert and oriented to person, place, and time.  Skin: Skin is warm.  Nursing note and vitals reviewed.   ED Course  Procedures (including critical care time) Labs Review Labs Reviewed  CBC - Abnormal; Notable for the following:    HCT 38.4 (*)    All other components within normal limits  BASIC METABOLIC PANEL  I-STAT TROPOININ, ED    Imaging Review Dg Chest 2 View  09/17/2015  CLINICAL DATA:  Chest pain since last night. EXAM: CHEST  2 VIEW COMPARISON:  None. FINDINGS: The  heart size and mediastinal contours are within normal limits. Both lungs are clear. The visualized skeletal structures are unremarkable. IMPRESSION: No active cardiopulmonary disease. Electronically Signed   By: Rolm Baptise M.D.   On: 09/17/2015 08:07   I have personally reviewed and evaluated these images and lab results as part of my medical decision-making.   EKG Interpretation   Date/Time:  Friday Sep 17 2015 07:36:16 EDT Ventricular Rate:  93 PR Interval:  142 QRS Duration: 74 QT Interval:  386 QTC Calculation: 480 R Axis:   65 Text Interpretation:  Sinus rhythm ST elev, probable normal early repol  pattern Borderline prolonged QT interval No old  tracing to compare No  acute changes Confirmed by Kathrynn Humble, MD, Thelma Comp [phone]) on 09/17/2015 7:45:47  AM      MDM   Final diagnoses:  Angina pectoris ([place])    Pt comes in with cc of chest pain.  Differential diagnosis includes: ACS syndrome Myocarditis Pericarditis  Pneumonia Pleural effusion PE Musculoskeletal pain Dissection PUD  The pain is non specific - as it is generalized, but is radiating to the back. Pt endorses associated dyspnea. Pain is not pleuritic. Pt has no hx of PE, DVT and denies any exogenous hormone use, long distance travels or surgery in the past 6 weeks, active cancer, recent immobilization. He has no smoking hx, drug use, and pulse are equal bilaterally. Pain is not similar to GERD.  EKG has diffuse ST elevation - inferior, lateral leads, aVR - ST depression. Clinically not pericarditis, and the ekg is not showing pericarditis either.  HEART SCORE IS 5  History  Moderately suspicious 1   ECG  Non specific repolarisation disturbance 1   Age  60 - 65 years 1  Risk Factors  ? 3 risk factors or history of atherosclerotic disease 2    Troponin  ? normal limit 0   We will admit for cardiac rule out with the HEAR score of 5.       Varney Biles, MD 09/17/15 [id number]  Varney Biles, MD 11/27/15 [phone]

## 2015-09-17 NOTE — ED Notes (Signed)
Attempted Report x1.   

## 2015-09-17 NOTE — ED Notes (Signed)
MD at the bedside speaking about plan of care

## 2015-09-18 DIAGNOSIS — I209 Angina pectoris, unspecified: Secondary | ICD-10-CM | POA: Insufficient documentation

## 2015-09-18 LAB — HEMOGLOBIN A1C
HEMOGLOBIN A1C: 11.2 % — AB (ref 4.8–5.6)
Mean Plasma Glucose: 275 mg/dL

## 2015-09-18 LAB — GLUCOSE, CAPILLARY
GLUCOSE-CAPILLARY: 241 mg/dL — AB (ref 65–99)
Glucose-Capillary: 274 mg/dL — ABNORMAL HIGH (ref 65–99)

## 2015-09-18 LAB — HIV ANTIBODY (ROUTINE TESTING W REFLEX): HIV Screen 4th Generation wRfx: NONREACTIVE

## 2015-09-18 MED ORDER — INSULIN NPH ISOPHANE & REGULAR (70-30) 100 UNIT/ML ~~LOC~~ SUSP: 50.0000 [IU] | Freq: Two times a day (BID) | SUBCUTANEOUS | Status: DC

## 2015-09-18 MED ORDER — METFORMIN HCL 500 MG PO TABS: 500.0000 mg | ORAL_TABLET | Freq: Two times a day (BID) | ORAL | Status: DC

## 2015-09-18 MED ORDER — ASPIRIN 81 MG PO TBEC: 81.0000 mg | DELAYED_RELEASE_TABLET | Freq: Every day | ORAL | Status: DC

## 2015-09-18 MED ORDER — PRAVASTATIN SODIUM 40 MG PO TABS: 40.0000 mg | ORAL_TABLET | Freq: Every day | ORAL | Status: DC

## 2015-09-18 NOTE — Progress Notes (Signed)
Nutrition Brief Note  Patient identified on the Malnutrition Screening Tool (MST) Report. Patient reports 20-25 lb weight loss over the past year, which is insignificant for the time frame.   Wt Readings from Last 15 Encounters:  09/18/15 207 lb 11.2 oz (94.212 kg)  01/15/13 219 lb (99.338 kg)  01/11/13 225 lb (102.059 kg)    Body mass index is 33.54 kg/(m^2). Patient meets criteria for class 1 obesity based on current BMI.   Current diet order is CHO modified, patient is consuming approximately 100% of meals at this time. Labs and medications reviewed.   No nutrition interventions warranted at this time. If nutrition issues arise, please consult RD.   Molli Barrows, RD, LDN, [place] Pager [phone] After Hours Pager [phone]

## 2015-09-18 NOTE — Progress Notes (Signed)
Subjective: Patient seen and examined this morning.  No acute events overnight since admission.  He is chest pain free today. Objective: Vital signs in last 24 hours: Filed Vitals:   09/17/15 1045 09/17/15 1130 09/17/15 1941 09/18/15 0439  BP: 122/92 135/90 123/87 118/76  Pulse: 88  92 79  Temp:  98 F (36.7 C) 98.6 F (37 C) 98.6 F (37 C)  TempSrc:  Oral Oral Oral  Resp: 15 18 18 18   Height:  5\' 6"  (1.676 m)    Weight:  212 lb 8.4 oz (96.4 kg)  207 lb 11.2 oz (94.212 kg)  SpO2: 94% 100% 97% 97%   Weight change:   Intake/Output Summary (Last 24 hours) at 09/18/15 1032 Last data filed at 09/18/15 0900  Gross per 24 hour  Intake    480 ml  Output      0 ml  Net    480 ml   General: resting in bed, no distress, pleasant HEENT: EOMI, no scleral icterus Cardiac: RRR, no rubs, murmurs or gallops Pulm: clear to auscultation bilaterally, moving normal volumes of air Ext: warm and well perfused, no pedal edema Neuro: alert and oriented X3, cranial nerves II-XII grossly intact  Lab Results: Basic Metabolic Panel:  Recent Labs Lab 09/17/15 0755  NA 134*  K 3.6  CL 98*  CO2 21*  GLUCOSE 243*  BUN <5*  CREATININE 0.98  CALCIUM 8.9   Liver Function Tests:  Recent Labs Lab 09/17/15 1510  AST 34  ALT 28  ALKPHOS 54  BILITOT 0.6  PROT 6.3*  ALBUMIN 3.6   No results for input(s): LIPASE, AMYLASE in the last 168 hours. No results for input(s): AMMONIA in the last 168 hours. CBC:  Recent Labs Lab 09/17/15 0755  WBC 8.4  HGB 13.4  HCT 38.4*  MCV 84.4  PLT 220   Cardiac Enzymes:  Recent Labs Lab 09/17/15 0906 09/17/15 1510 09/17/15 2045  TROPONINI 0.03 <0.03 <0.03   D-Dimer:  Recent Labs Lab 09/17/15 1510  DDIMER <0.27   CBG:  Recent Labs Lab 09/17/15 1148 09/17/15 2058 09/18/15 0742  GLUCAP 228* 337* 241*   Hemoglobin A1C:  Recent Labs Lab 09/17/15 0906  HGBA1C 11.2*   Fasting Lipid Panel:  Recent Labs Lab 09/17/15 0906    CHOL 175  HDL 29*  LDLCALC 82  TRIG 318*  CHOLHDL 6.0   Urine Drug Screen: Drugs of Abuse     Component Value Date/Time   LABOPIA NONE DETECTED 09/17/2015 1051   COCAINSCRNUR POSITIVE* 09/17/2015 1051   LABBENZ NONE DETECTED 09/17/2015 1051   AMPHETMU NONE DETECTED 09/17/2015 1051   THCU NONE DETECTED 09/17/2015 1051   LABBARB NONE DETECTED 09/17/2015 1051     Micro Results: No results found for this or any previous visit (from the past 240 hour(s)). Studies/Results: Dg Chest 2 View  09/17/2015  CLINICAL DATA:  Chest pain since last night. EXAM: CHEST  2 VIEW COMPARISON:  None. FINDINGS: The heart size and mediastinal contours are within normal limits. Both lungs are clear. The visualized skeletal structures are unremarkable. IMPRESSION: No active cardiopulmonary disease. Electronically Signed   By: Rolm Baptise M.D.   On: 09/17/2015 08:07   Medications: I have reviewed the patient's current medications. Scheduled Meds: . aspirin EC  81 mg Oral Daily  . enoxaparin (LOVENOX) injection  40 mg Subcutaneous Q24H  . feeding supplement (ENSURE ENLIVE)  237 mL Oral BID BM  . insulin aspart protamine- aspart  30 Units Subcutaneous  BID WC  . sodium chloride flush  3 mL Intravenous Q12H   Continuous Infusions:  PRN Meds:.sodium chloride flush Assessment/Plan: Principal Problem:   Chest pain Active Problems:   Type 2 diabetes mellitus without complications ([facility])   Hypercholesterolemia  Chest pain: Atypical description at admission (sharp, across chest, lasting several hours and then resolving spontaneously). Troponin trended overnight was negative, d-dimer also negative.  Chest pain has completely resolved.  Maintains adamantly denies cocaine use although UDS was positive as possible source of pain.  Also could have been MSK related vs GERD. - d/c today - add daily aspirin  Type 2 diabetes mellitus without complications He reports last A1c was 11.5 at his PCP several months.   This admission was 11.2.  Was greater than 14 about 2 years ago - discharge on home meds, needs close outpatient follow up with his PCP  Hypercholesterolemia - lipid panel with TC 175, HDL 29, LDL 82 - should be on high intensity statin based on risk factors.  LFTs this admission are normal - will defer to outpatient PCP to initiate as patient currently without insurance but reports will take effect this week  Cocaine use: UDS + cocaine. - social work consult  Diet: Carb mod  VTE ppx: [place] lovenox  Code status: Full  Dispo: Discharge today  The patient does have a current PCP (Nolene Ebbs, MD) and does not need an [facility] hospital follow-up appointment after discharge.  The patient does not have transportation limitations that hinder transportation to clinic appointments.  .Services Needed at time of discharge: Y = Yes, Blank = No PT:   OT:   RN:   Equipment:   Other:       Jule Ser, DO 09/18/2015, 10:32 AM

## 2015-09-18 NOTE — Discharge Instructions (Signed)
Bradley Howe,  It was a pleasure taking care of you in the hospital.  We have been able adequately rule out that you did not have a heart attack or a pulmonary embolus as the cause of your chest pain.    Please follow up with your primary care doctor to help manage your diabetes and cholesterol.  You should be on a medication called a Statin for cholesterol.  Take care, Dr. Juleen China

## 2015-09-18 NOTE — Discharge Summary (Signed)
Name: Bradley Howe MRN: [id number] DOB: Jan 11, 1966 50 y.o. PCP: Nolene Ebbs, MD  Date of Admission: 09/17/2015  7:31 AM Date of Discharge: 09/18/2015 Attending Physician: No att. providers found  Discharge Diagnosis:  Principal Problem:   Chest pain Active Problems:   Type 2 diabetes mellitus without complications ([place])   Hypercholesterolemia   Angina pectoris ([place])  Discharge Medications:   Medication List    TAKE these medications        aspirin 81 MG EC tablet  Take 1 tablet (81 mg total) by mouth daily.     insulin NPH-regular Human (70-30) 100 UNIT/ML injection  Commonly known as:  NOVOLIN 70/30  Inject 50 Units into the skin 2 (two) times daily with a meal.     metFORMIN 500 MG tablet  Commonly known as:  GLUCOPHAGE  Take 1 tablet (500 mg total) by mouth 2 (two) times daily with a meal.     pravastatin 40 MG tablet  Commonly known as:  PRAVACHOL  Take 1 tablet (40 mg total) by mouth daily.        Disposition and follow-up:   [name] was discharged from [facility] in Stable condition.    1.  At the hospital follow up visit please address:  - Diabetes regimen and management - Counseling for positive UDS for cocaine - Management of hyperlipidemia - He was given prescription for statin prior to discharge  2.  Labs / imaging needed at time of follow-up: LFT's in 4-6 weeks, A1c in 3 months  3.  Pending labs/ test needing follow-up: none  Follow-up Appointments: Follow-up Information    Schedule an appointment as soon as possible for a visit with Philis Fendt, MD.   Specialty:  Internal Medicine   Contact information:   [address] [place] [address] [phone]       Discharge Instructions:   Consultations:    Procedures Performed:  Dg Chest 2 View  09/17/2015  CLINICAL DATA:  Chest pain since last night. EXAM: CHEST  2 VIEW COMPARISON:  None. FINDINGS: The heart size and mediastinal contours are within  normal limits. Both lungs are clear. The visualized skeletal structures are unremarkable. IMPRESSION: No active cardiopulmonary disease. Electronically Signed   By: Rolm Baptise M.D.   On: 09/17/2015 08:07    Admission HPI:  Mr. 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 CP was sharp 6/10 on left and right chest. Associated with dyspnea, diaphoresis. Lasted several hours. He was able to continue working, but at a slower pace due to [place]. The chest pain resolved on its own. Last night he woke up with the same pain. He describes sharp pain but more intense, 9/10 and radiating into back and associated with dyspnea. His pillow was soaked with sweat. He was able to drive himself to the hospital. The pain lasted a few hours but has lessened in intensity, currently 4/10. This has never happened before. He has reflux but this feels different. He denies recent URI or flu symptoms, lightheadedness, recent long car/plane trips, surgery, no new medications, no tobacco/drug use. He drinks beer socially. He reports compliance with Novolin 70/30 50 units BID, metformin 1000mg  daily. Has not taken pravastatin in 6 months because of cost.  Hospital Course by problem list: Principal Problem:   Chest pain Active Problems:   Type 2 diabetes mellitus without  complications ([place])   Hypercholesterolemia   Angina pectoris ([place])   Chest pain: Atypical description at admission (sharp, across chest, lasting several hours and then resolving spontaneously). Troponin trended overnight was negative, d-dimer also negative. Chest pain has completely resolved. Maintains adamantly denies cocaine use although UDS was positive as possible source of pain. Likely could have been MSK related vs GERD.  Will add daily aspirin at discharge.  Type 2 diabetes mellitus without complications He reports last A1c was 11.5 at his PCP  several months. This admission was 11.2. Was greater than 14 about 2 years ago.  Will discharge on home meds, needs close outpatient follow up with his PCP.  Hypercholesterolemia: His lipid panel with TC 175, HDL 29, LDL 82.  Based on ASCVD risk, should be on high intensity statin based on risk factors. LFTs this admission are normal.  Has prescription for pravastatin on medication list.  Apparently had some issues with insurance and so he had not been taking.  Prior to discharge, he stated that insurance issue was reconciled and asking for refill of this and so we provided rx for his pravastatin with close outpatient follow up.  Cocaine use: UDS + cocaine: we counseled on how this could contribute to his chest pain although patient adamantly denied using cocaine.  Recommend follow up counseling as an outpatient.   Discharge Vitals:   BP 118/76 mmHg  Pulse 79  Temp(Src) 98.6 F (37 C) (Oral)  Resp 18  Ht 5\' 6"  (1.676 m)  Wt 207 lb 11.2 oz (94.212 kg)  BMI 33.54 kg/m2  SpO2 97%  Discharge Labs:  No results found for this or any previous visit (from the past 24 hour(s)).  Signed: Jule Ser, DO 09/20/2015, 1:34 PM    Services Ordered on Discharge: none Equipment Ordered on Discharge: none

## 2017-08-24 ENCOUNTER — Encounter: Payer: Self-pay | Admitting: Gastroenterology

## 2017-09-07 ENCOUNTER — Ambulatory Visit (HOSPITAL_COMMUNITY)
Admission: EM | Admit: 2017-09-07 | Discharge: 2017-09-07 | Disposition: A | Discharge status: Home / Self Care | Payer: 59 | Attending: Family Medicine | Admitting: Family Medicine

## 2017-09-07 ENCOUNTER — Encounter (HOSPITAL_COMMUNITY): Payer: Self-pay | Admitting: *Deleted

## 2017-09-07 DIAGNOSIS — N400 Enlarged prostate without lower urinary tract symptoms: Secondary | ICD-10-CM | POA: Diagnosis not present

## 2017-09-07 DIAGNOSIS — Z7982 Long term (current) use of aspirin: Secondary | ICD-10-CM | POA: Insufficient documentation

## 2017-09-07 DIAGNOSIS — Z79899 Other long term (current) drug therapy: Secondary | ICD-10-CM | POA: Insufficient documentation

## 2017-09-07 DIAGNOSIS — E7800 Pure hypercholesterolemia, unspecified: Secondary | ICD-10-CM | POA: Insufficient documentation

## 2017-09-07 DIAGNOSIS — Z8249 Family history of ischemic heart disease and other diseases of the circulatory system: Secondary | ICD-10-CM | POA: Diagnosis not present

## 2017-09-07 DIAGNOSIS — Z87891 Personal history of nicotine dependence: Secondary | ICD-10-CM | POA: Diagnosis not present

## 2017-09-07 DIAGNOSIS — E119 Type 2 diabetes mellitus without complications: Secondary | ICD-10-CM | POA: Diagnosis not present

## 2017-09-07 DIAGNOSIS — Z794 Long term (current) use of insulin: Secondary | ICD-10-CM | POA: Insufficient documentation

## 2017-09-07 DIAGNOSIS — S39012A Strain of muscle, fascia and tendon of lower back, initial encounter: Secondary | ICD-10-CM

## 2017-09-07 DIAGNOSIS — X58XXXA Exposure to other specified factors, initial encounter: Secondary | ICD-10-CM | POA: Insufficient documentation

## 2017-09-07 DIAGNOSIS — I10 Essential (primary) hypertension: Secondary | ICD-10-CM | POA: Diagnosis not present

## 2017-09-07 LAB — POCT I-STAT, CHEM 8
BUN: 13 mg/dL (ref 6–20)
CALCIUM ION: 1.07 mmol/L — AB (ref 1.15–1.40)
CHLORIDE: 101 mmol/L (ref 101–111)
Creatinine, Ser: 1.1 mg/dL (ref 0.61–1.24)
Glucose, Bld: 184 mg/dL — ABNORMAL HIGH (ref 65–99)
HCT: 48 % (ref 39.0–52.0)
HEMOGLOBIN: 16.3 g/dL (ref 13.0–17.0)
POTASSIUM: 3.9 mmol/L (ref 3.5–5.1)
Sodium: 133 mmol/L — ABNORMAL LOW (ref 135–145)
TCO2: 20 mmol/L — ABNORMAL LOW (ref 22–32)

## 2017-09-07 LAB — POCT URINALYSIS DIP (DEVICE)
Glucose, UA: 250 mg/dL — AB
Hgb urine dipstick: NEGATIVE
LEUKOCYTES UA: NEGATIVE
NITRITE: NEGATIVE
Protein, ur: 30 mg/dL — AB
Specific Gravity, Urine: >=1.03 (ref 1.005–1.030)
Urobilinogen, UA: 1 mg/dL (ref 0.0–1.0)
pH: 6 (ref 5.0–8.0)

## 2017-09-07 LAB — PSA: Prostatic Specific Antigen: 0.53 ng/mL (ref 0.00–4.00)

## 2017-09-07 MED ORDER — PREDNISONE 20 MG PO TABS: ORAL_TABLET | ORAL | 0 refills | Status: DC

## 2017-09-07 MED ORDER — DICLOFENAC SODIUM 75 MG PO TBEC: 75.0000 mg | DELAYED_RELEASE_TABLET | Freq: Two times a day (BID) | ORAL | 0 refills | Status: DC

## 2017-09-07 NOTE — Discharge Instructions (Signed)
If pain worsens, return here.  Otherwise, follow up with [facility] clinic

## 2017-09-07 NOTE — ED Provider Notes (Addendum)
[place]   [id number] 09/07/17 Arrival Time: 1008   SUBJECTIVE:  Bradley Howe is a 52 y.o. male who presents to the urgent care with complaint of low back pain.  Patient reports 3 days ago he was lifting furniture and popped his lower back. Patient does a lot of bending and lifting at work, usually takes ibuprofen for the discomfort but is not working this time.   Patient notes some aching in bilateral groin area.  Urine flow has been diminished for some time now.  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  The low back pain precedes the recent injury.  He has missed the last few days of work.  No weakness in legs.  No fever.  No hematuria.  Patient has appt at [facility] in one week.  Glucose runs around 200 typically.   Past Medical History:  Diagnosis Date  . GERD (gastroesophageal reflux disease)   . High cholesterol   . Hypertension   . Type II diabetes mellitus (H[facility])    Family History  Problem Relation Age of Onset  . Hypertension Mother   . Hypertension Father   . Diabetes Paternal Grandmother   . Heart disease Neg Hx    Social History   Socioeconomic History  . Marital status: Married    Spouse name: Not on file  . Number of children: Not on file  . Years of education: Not on file  . Highest education level: Not on file  Occupational History  . Not on file  Social Needs  . Financial resource strain: Not on file  . Food insecurity:    Worry: Not on file    Inability: Not on file  . Transportation needs:    Medical: Not on file    Non-medical: Not on file  Tobacco Use  . Smoking status: Former Smoker    Packs/day: 0.10    Years: 25.00    Pack years: 2.50    Types: Cigarettes  . Smokeless tobacco: Never Used  . Tobacco comment: "quit smoking cigarettes in 2013"  Substance and Sexual Activity  . Alcohol use: Yes    Alcohol/week: 7.2 oz    Types: 12 Cans of beer per week    Comment:  weekends  . Drug use: No  . Sexual activity: Yes  Lifestyle  . Physical activity:    Days per week: Not on file    Minutes per session: Not on file  . Stress: Not on file  Relationships  . Social connections:    Talks on phone: Not on file    Gets together: Not on file    Attends religious service: Not on file    Active member of club or organization: Not on file    Attends meetings of clubs or organizations: Not on file    Relationship status: Not on file  . Intimate partner violence:    Fear of current or ex partner: Not on file    Emotionally abused: Not on file    Physically abused: Not on file    Forced sexual activity: Not on file  Other Topics Concern  . Not on file  Social History Narrative  . Not on file   Current Meds  Medication Sig  . aspirin EC 81 MG EC tablet Take 1 tablet (81 mg total) by mouth daily.  . insulin NPH-regular Human (NOVOLIN 70/30) (70-30) 100 UNIT/ML injection Inject 50 Units into the skin 2 (  two) times daily with a meal.  . metFORMIN (GLUCOPHAGE) 500 MG tablet Take 1 tablet (500 mg total) by mouth 2 (two) times daily with a meal.  . pravastatin (PRAVACHOL) 40 MG tablet Take 1 tablet (40 mg total) by mouth daily.  . [DISCONTINUED] ibuprofen (ADVIL,MOTRIN) 800 MG tablet Take 800 mg by mouth every 8 (eight) hours as needed.   No Known Allergies    ROS: As per HPI, remainder of ROS negative.   OBJECTIVE:   Vitals:   09/07/17 1049  BP: 125/77  Pulse: 96  Resp: 17  Temp: 98 F (36.7 C)  TempSrc: Oral  SpO2: 99%     General appearance: alert; no distress Eyes: PERRL; EOMI; conjunctiva normal HENT: normocephalic; atraumatic;  oral mucosa normal Neck: supple Lungs: clear to auscultation bilaterally Heart: regular rate and rhythm Back: no CVA tenderness; muscles are tight in both lumbar paraspinal regions and patient is mildly tender with palpation of the paraspinal lumbar regions.  No scoliosis Extremities: no cyanosis or edema;  symmetrical with no gross deformities; straight leg raising is negative and patient has normal sensation in the lower extremities. Skin: warm and dry Neurologic: normal gait; grossly normal Psychological: alert and cooperative; normal mood and affect      Labs:  Results for orders placed or performed during the hospital encounter of 09/07/17  POCT urinalysis dip (device)  Result Value Ref Range   Glucose, UA 250 (A) NEGATIVE mg/dL   Bilirubin Urine SMALL (A) NEGATIVE   Ketones, ur TRACE (A) NEGATIVE mg/dL   Specific Gravity, Urine >=1.030 1.005 - 1.030   Hgb urine dipstick NEGATIVE NEGATIVE   pH 6.0 5.0 - 8.0   Protein, ur 30 (A) NEGATIVE mg/dL   Urobilinogen, UA 1.0 0.0 - 1.0 mg/dL   Nitrite NEGATIVE NEGATIVE   Leukocytes, UA NEGATIVE NEGATIVE  I-STAT, chem 8  Result Value Ref Range   Sodium 133 (L) 135 - 145 mmol/L   Potassium 3.9 3.5 - 5.1 mmol/L   Chloride 101 101 - 111 mmol/L   BUN 13 6 - 20 mg/dL   Creatinine, Ser 1.10 0.61 - 1.24 mg/dL   Glucose, Bld 184 (H) 65 - 99 mg/dL   Calcium, Ion 1.07 (L) 1.15 - 1.40 mmol/L   TCO2 20 (L) 22 - 32 mmol/L   Hemoglobin 16.3 13.0 - 17.0 g/dL   HCT 48.0 39.0 - 52.0 %    Labs Reviewed  POCT URINALYSIS DIP (DEVICE) - Abnormal; Notable for the following components:      Result Value   Glucose, UA 250 (*)    Bilirubin Urine SMALL (*)    Ketones, ur TRACE (*)    Protein, ur 30 (*)    All other components within normal limits  POCT I-STAT, CHEM 8 - Abnormal; Notable for the following components:   Sodium 133 (*)    Glucose, Bld 184 (*)    Calcium, Ion 1.07 (*)    TCO2 20 (*)    All other components within normal limits  PSA    No results found.     ASSESSMENT & PLAN:  1. Strain of lumbar region, initial encounter   2. Prostatism   3. Type 2 diabetes mellitus without complication, with long-term current use of insulin ([place])     Meds ordered this encounter  Medications  . predniSONE (DELTASONE) 20 MG tablet     Sig: Two daily with food    Dispense:  6 tablet    Refill:  0  .  diclofenac (VOLTAREN) 75 MG EC tablet    Sig: Take 1 tablet (75 mg total) by mouth 2 (two) times daily.    Dispense:  14 tablet    Refill:  0    Reviewed expectations re: course of current medical issues. Questions answered. Outlined signs and symptoms indicating need for more acute intervention. Patient verbalized understanding. After Visit Summary given.    Procedures:      Robyn Haber, MD 09/07/17 1108    Robyn Haber, MD 09/07/17 [phone]

## 2017-09-07 NOTE — ED Triage Notes (Signed)
Patient reports 3 days ago he was lifting furniture and popped his lower back. Patient does a lot of bending and lifting at work, usually takes ibuprofen for the discomfort but is not working this time.

## 2017-09-08 NOTE — Progress Notes (Signed)
Results are within normal range. Pt contacted and made aware. Verbalized understanding.   

## 2017-09-21 ENCOUNTER — Ambulatory Visit: Payer: Self-pay | Admitting: Physician Assistant

## 2017-10-18 ENCOUNTER — Telehealth: Payer: Self-pay

## 2017-10-18 NOTE — Telephone Encounter (Signed)
Patient No Showed for Pre-Visit. I called to reschedule his PV appointment and the patient states that he needed to discuss it with his work to see when he could be off for PV and the colonoscopy. Patient wanted to cancel his colonoscopy and call back after he spoke with his supervisor. Patient states he will call and reschedule.   Riki Sheer, LPN  ( PV)

## 2017-11-01 ENCOUNTER — Encounter: Payer: Self-pay | Admitting: Gastroenterology

## 2018-06-18 ENCOUNTER — Telehealth (HOSPITAL_COMMUNITY): Payer: Self-pay | Admitting: Psychology

## 2018-06-21 ENCOUNTER — Ambulatory Visit (HOSPITAL_COMMUNITY): Payer: Self-pay | Admitting: Psychology

## 2019-05-30 ENCOUNTER — Ambulatory Visit: Payer: Self-pay | Admitting: Family Medicine

## 2019-06-20 ENCOUNTER — Encounter: Payer: Self-pay | Admitting: Family Medicine

## 2019-06-20 ENCOUNTER — Ambulatory Visit (INDEPENDENT_AMBULATORY_CARE_PROVIDER_SITE_OTHER): Payer: 59

## 2019-06-20 ENCOUNTER — Other Ambulatory Visit: Payer: Self-pay

## 2019-06-20 ENCOUNTER — Ambulatory Visit: Payer: 59 | Admitting: Family Medicine

## 2019-06-20 VITALS — BP 136/82 | HR 92 | Temp 97.1°F | Ht 67.0 in | Wt 228.0 lb

## 2019-06-20 DIAGNOSIS — Z1211 Encounter for screening for malignant neoplasm of colon: Secondary | ICD-10-CM

## 2019-06-20 DIAGNOSIS — E119 Type 2 diabetes mellitus without complications: Secondary | ICD-10-CM

## 2019-06-20 DIAGNOSIS — R05 Cough: Secondary | ICD-10-CM

## 2019-06-20 DIAGNOSIS — Z794 Long term (current) use of insulin: Secondary | ICD-10-CM

## 2019-06-20 DIAGNOSIS — R059 Cough, unspecified: Secondary | ICD-10-CM

## 2019-06-20 DIAGNOSIS — Z113 Encounter for screening for infections with a predominantly sexual mode of transmission: Secondary | ICD-10-CM

## 2019-06-20 NOTE — Progress Notes (Signed)
Subjective:  Patient ID: Bradley Howe, male    DOB: 12/09/1965  Age: 54 y.o. MRN: [id number]  CC:  Chief Complaint  Patient presents with  . Establish Care    pt states he has had a cough for the past 2 months. pt has had 3 covid tests and all were negative. cough is productive. pt as tried flonas and this didn't help much. pt has been having issues controling his diabetes. pt take B.S. 2x daily pt stats it ranges from 142-300 mg/dl. pt also would like to get STD testing. no knowen exposure.   HPI Bradley Howe presents for   New patient to me to establish care with acute concerns as above.  Has a nurse that comes to job - has been following lab work., and prescribes meds. Works for [employer].  Cough: Reports cough for the past 2-3 months. Clear phlegm at times. Feels worse past few days - more clear mucus past week. Worse lying down.  Minimal improvement with Flonase - tried past 2 weeks.  Has acid reflux - takes omeprazole every other day. Heartburn has been controlled, no break thorough symptoms. Soreness with cough, no other chest pain. No dyspnea. No change in taste/smell.  No fevers. Rare headache.    Reports multiple negative Covid test, most recently at [facility] 1.5 months ago -  Tobacco - 3-4 cigs per week.  Alcohol - 2-3 beers during week, 6 on Saturday with shots at times.  No known exposure to Covid 19.  Tx:   Diabetes: With reported CKD - elevated creat on labs.  Reports Aic 10.1 few weeks ago with testing at work. Will bring labs to next visit.  Metformin 500mg  BID, 70/30 insulin - 50 units BID, glipizide 5mg  BID. Increased from 25uQAM, 50uQPm, few weeks ago to 50u BID now. Has endocrinology appt later this month.  Home readings 142-300. Lowest in 90's about a month ago. No recent symptomatic lows.  On statin - pravastatin.  optho - 4 months ago.  microalbumin - unknown.   Lab Results  Component Value Date   HGBA1C 11.2 (H) 09/17/2015   HGBA1C =>14%  01/15/2013   Lab Results  Component Value Date   LDLCALC 82 09/17/2015   CREATININE 1.10 09/07/2017   STI testing: Tests every 6 months.  No new partners, 2 current partners - unprotected intercourse with both. No prior STI.  No penile discharge, rash, or dysuria - no symptoms.   History Patient Active Problem List   Diagnosis Date Noted  . Angina pectoris ([place])   . Chest pain 09/17/2015  . Type 2 diabetes mellitus without complications ([place]) [id number]  . Hypercholesterolemia 01/15/2013   Past Medical History:  Diagnosis Date  . GERD (gastroesophageal reflux disease)   . High cholesterol   . Hypertension   . Type II diabetes mellitus ([place])    Past Surgical History:  Procedure Laterality Date  . INGUINAL HERNIA REPAIR Left 2013   No Known Allergies Prior to Admission medications   Medication Sig Start Date End Date Taking? Authorizing Provider  aspirin EC 81 MG EC tablet Take 1 tablet (81 mg total) by mouth daily. 09/18/15  Yes Jule Ser, DO  ibuprofen (ADVIL) 800 MG tablet Take by mouth. 10/13/14  Yes [provider]  insulin NPH-regular Human (NOVOLIN 70/30) (70-30) 100 UNIT/ML injection Inject 50 Units into the skin 2 (two) times daily with a meal. 09/18/15  Yes Jule Ser, DO  metFORMIN (GLUCOPHAGE) 500 MG tablet Take 1  tablet (500 mg total) by mouth 2 (two) times daily with a meal. 09/18/15  Yes Jule Ser, DO  omeprazole (PRILOSEC) 20 MG capsule Take by mouth. 05/12/11  Yes [provider]  pravastatin (PRAVACHOL) 40 MG tablet Take 1 tablet (40 mg total) by mouth daily. 09/18/15  Yes Jule Ser, DO  predniSONE (DELTASONE) 20 MG tablet Two daily with food 09/07/17  Yes Robyn Haber, MD  diclofenac (VOLTAREN) 75 MG EC tablet Take 1 tablet (75 mg total) by mouth 2 (two) times daily. Patient not taking: Reported on 06/20/2019 09/07/17   Robyn Haber, MD   Social History   Socioeconomic History  . Marital status: Married    Spouse  name: Not on file  . Number of children: Not on file  . Years of education: Not on file  . Highest education level: Not on file  Occupational History  . Not on file  Tobacco Use  . Smoking status: Former Smoker    Packs/day: 0.10    Years: 25.00    Pack years: 2.50    Types: Cigarettes  . Smokeless tobacco: Never Used  . Tobacco comment: "quit smoking cigarettes in 2013"  Substance and Sexual Activity  . Alcohol use: Yes    Alcohol/week: 12.0 standard drinks    Types: 12 Cans of beer per week    Comment: weekends  . Drug use: No  . Sexual activity: Yes  Other Topics Concern  . Not on file  Social History Narrative  . Not on file   Social Determinants of Health   Financial Resource Strain:   . Difficulty of Paying Living Expenses: Not on file  Food Insecurity:   . Worried About Charity fundraiser in the Last Year: Not on file  . Ran Out of Food in the Last Year: Not on file  Transportation Needs:   . Lack of Transportation (Medical): Not on file  . Lack of Transportation (Non-Medical): Not on file  Physical Activity:   . Days of Exercise per Week: Not on file  . Minutes of Exercise per Session: Not on file  Stress:   . Feeling of Stress : Not on file  Social Connections:   . Frequency of Communication with Friends and Family: Not on file  . Frequency of Social Gatherings with Friends and Family: Not on file  . Attends Religious Services: Not on file  . Active Member of Clubs or Organizations: Not on file  . Attends Archivist Meetings: Not on file  . Marital Status: Not on file  Intimate Partner Violence:   . Fear of Current or Ex-Partner: Not on file  . Emotionally Abused: Not on file  . Physically Abused: Not on file  . Sexually Abused: Not on file    Review of Systems   Objective:   Vitals:   06/20/19 1334 06/20/19 1350  BP: (!) 143/88 136/82  Pulse: 92   Temp: (!) 97.1 F (36.2 C)   TempSrc: Temporal   SpO2: 97%   Weight: 228 lb (103.4  kg)   Height: 5\' 7"  (1.702 m)      Physical Exam Vitals reviewed.  Constitutional:      Appearance: He is well-developed.  HENT:     Head: Normocephalic and atraumatic.     Right Ear: Tympanic membrane, ear canal and external ear normal.     Left Ear: Tympanic membrane, ear canal and external ear normal.     Nose: No rhinorrhea.  Mouth/Throat:     Pharynx: No oropharyngeal exudate or posterior oropharyngeal erythema.  Eyes:     Conjunctiva/sclera: Conjunctivae normal.     Pupils: Pupils are equal, round, and reactive to light.  Neck:     Vascular: No carotid bruit or JVD.  Cardiovascular:     Rate and Rhythm: Normal rate and regular rhythm.     Heart sounds: Normal heart sounds. No murmur.  Pulmonary:     Effort: Pulmonary effort is normal.     Breath sounds: Normal breath sounds. No wheezing, rhonchi or rales.  Abdominal:     Palpations: Abdomen is soft.     Tenderness: There is no abdominal tenderness.  Musculoskeletal:     Cervical back: Neck supple.  Lymphadenopathy:     Cervical: No cervical adenopathy.  Skin:    General: Skin is warm and dry.     Findings: No rash.  Neurological:     Mental Status: He is alert and oriented to person, place, and time.  Psychiatric:        Behavior: Behavior normal.     DG Chest 2 View  Result Date: 06/20/2019 CLINICAL DATA:  Cough for 2-3 months EXAM: CHEST - 2 VIEW COMPARISON:  09/17/2015 FINDINGS: The heart size and mediastinal contours are within normal limits. Both lungs are clear. The visualized skeletal structures are unremarkable. IMPRESSION: No active cardiopulmonary disease. Electronically Signed   By: Davina Poke D.O.   On: 06/20/2019 14:46      Assessment & Plan:  Zaya Spektor is a 54 y.o. male . Type 2 diabetes mellitus without complication, with long-term current use of insulin ([place])  outside records requested. May need furhter med adjustment.   Screen for colon cancer - Plan: Ambulatory referral to  Gastroenterology  Cough - Plan: DG Chest 2 View, Novel Coronavirus, NAA ([facility])  - differential includes upper airway cough syndrome - increase PPI to daily, continue flonase. Check covid testing but unlikely. CXR reassuring. recehck 1 week.   Routine screening for STI (sexually transmitted infection) - Plan: HIV Antibody (routine testing w rflx), RPR, GC/Chlamydia probe amp ([place])not at [facility]  - testing above, safer sex practices discussed.   No orders of the defined types were placed in this encounter.  Patient Instructions    I do recommend condom every time to lessen risk of STI's.   Bring copy of labs to next visit. I will try to get some records form prior provider.   Omeprazole daily for now, mucinex DM if needed for cough. Ok to continue flonase for now.   recheck in 1 week.  Return to the clinic or go to the nearest emergency room if any of your symptoms worsen or new symptoms occur.   Cough, Adult Coughing is a reflex that clears your throat and your airways (respiratory system). Coughing helps to heal and protect your lungs. It is normal to cough occasionally, but a cough that happens with other symptoms or lasts a long time may be a sign of a condition that needs treatment. An acute cough may only last 2-3 weeks, while a chronic cough may last 8 or more weeks. Coughing is commonly caused by:  Infection of the respiratory systemby viruses or bacteria.  Breathing in substances that irritate your lungs.  Allergies.  Asthma.  Mucus that runs down the back of your throat (postnasal drip).  Smoking.  Acid backing up from the stomach into the esophagus (gastroesophageal reflux).  Certain medicines.  Chronic lung problems.  Other medical conditions such as heart failure or a blood clot in the lung (pulmonary embolism). Follow these instructions at home: Medicines  Take over-the-counter and prescription medicines only as told by your health care  provider.  Talk with your health care provider before you take a cough suppressant medicine. Lifestyle   Avoid cigarette smoke. Do not use any products that contain nicotine or tobacco, such as cigarettes, e-cigarettes, and chewing tobacco. If you need help quitting, ask your health care provider.  Drink enough fluid to keep your urine pale yellow.  Avoid caffeine.  Do not drink alcohol if your health care provider tells you not to drink. General instructions   Pay close attention to changes in your cough. Tell your health care provider about them.  Always cover your mouth when you cough.  Avoid things that make you cough, such as perfume, candles, cleaning products, or campfire or tobacco smoke.  If the air is dry, use a cool mist vaporizer or humidifier in your bedroom or your home to help loosen secretions.  If your cough is worse at night, try to sleep in a semi-upright position.  Rest as needed.  Keep all follow-up visits as told by your health care provider. This is important. Contact a health care provider if you:  Have new symptoms.  Cough up pus.  Have a cough that does not get better after 2-3 weeks or gets worse.  Cannot control your cough with cough suppressant medicines and you are losing sleep.  Have pain that gets worse or pain that is not helped with medicine.  Have a fever.  Have unexplained weight loss.  Have night sweats. Get help right away if:  You cough up blood.  You have difficulty breathing.  Your heartbeat is very fast. These symptoms may represent a serious problem that is an emergency. Do not wait to see if the symptoms will go away. Get medical help right away. Call your local emergency services (911 in the [place] Do not drive yourself to the hospital. Summary  Coughing is a reflex that clears your throat and your airways. It is normal to cough occasionally, but a cough that happens with other symptoms or lasts a long time may be a  sign of a condition that needs treatment.  Take over-the-counter and prescription medicines only as told by your health care provider.  Always cover your mouth when you cough.  Contact a health care provider if you have new symptoms or a cough that does not get better after 2-3 weeks or gets worse. This information is not intended to replace advice given to you by your health care provider. Make sure you discuss any questions you have with your health care provider. Document Revised: 05/20/2018 Document Reviewed: 05/20/2018 Elsevier Patient Education  [employer].    If you have lab work done today you will be contacted with your lab results within the next 2 weeks.  If you have not heard from [place] then please contact us. The fastest way to get your results is to register for My Chart.   IF you received an x-ray today, you will receive an invoice from [facility]. Please contact [facility] at [phone] with questions or concerns regarding your invoice.   IF you received labwork today, you will receive an invoice from [place]. Please contact [facility] at [phone] with questions or concerns regarding your invoice.   Our billing staff will not be able to assist you with questions regarding bills  from these companies.  You will be contacted with the lab results as soon as they are available. The fastest way to get your results is to activate your My Chart account. Instructions are located on the last page of this paperwork. If you have not heard from [place] regarding the results in 2 weeks, please contact this office.         Signed, Merri Ray, MD [facility]

## 2019-06-20 NOTE — Patient Instructions (Addendum)
I do recommend condom every time to lessen risk of STI's.   Bring copy of labs to next visit. I will try to get some records form prior provider.   Omeprazole daily for now, mucinex DM if needed for cough. Ok to continue flonase for now.   recheck in 1 week.  Return to the clinic or go to the nearest emergency room if any of your symptoms worsen or new symptoms occur.   Cough, Adult Coughing is a reflex that clears your throat and your airways (respiratory system). Coughing helps to heal and protect your lungs. It is normal to cough occasionally, but a cough that happens with other symptoms or lasts a long time may be a sign of a condition that needs treatment. An acute cough may only last 2-3 weeks, while a chronic cough may last 8 or more weeks. Coughing is commonly caused by:  Infection of the respiratory systemby viruses or bacteria.  Breathing in substances that irritate your lungs.  Allergies.  Asthma.  Mucus that runs down the back of your throat (postnasal drip).  Smoking.  Acid backing up from the stomach into the esophagus (gastroesophageal reflux).  Certain medicines.  Chronic lung problems.  Other medical conditions such as heart failure or a blood clot in the lung (pulmonary embolism). Follow these instructions at home: Medicines  Take over-the-counter and prescription medicines only as told by your health care provider.  Talk with your health care provider before you take a cough suppressant medicine. Lifestyle   Avoid cigarette smoke. Do not use any products that contain nicotine or tobacco, such as cigarettes, e-cigarettes, and chewing tobacco. If you need help quitting, ask your health care provider.  Drink enough fluid to keep your urine pale yellow.  Avoid caffeine.  Do not drink alcohol if your health care provider tells you not to drink. General instructions   Pay close attention to changes in your cough. Tell your health care provider about  them.  Always cover your mouth when you cough.  Avoid things that make you cough, such as perfume, candles, cleaning products, or campfire or tobacco smoke.  If the air is dry, use a cool mist vaporizer or humidifier in your bedroom or your home to help loosen secretions.  If your cough is worse at night, try to sleep in a semi-upright position.  Rest as needed.  Keep all follow-up visits as told by your health care provider. This is important. Contact a health care provider if you:  Have new symptoms.  Cough up pus.  Have a cough that does not get better after 2-3 weeks or gets worse.  Cannot control your cough with cough suppressant medicines and you are losing sleep.  Have pain that gets worse or pain that is not helped with medicine.  Have a fever.  Have unexplained weight loss.  Have night sweats. Get help right away if:  You cough up blood.  You have difficulty breathing.  Your heartbeat is very fast. These symptoms may represent a serious problem that is an emergency. Do not wait to see if the symptoms will go away. Get medical help right away. Call your local emergency services (911 in the [place] Do not drive yourself to the hospital. Summary  Coughing is a reflex that clears your throat and your airways. It is normal to cough occasionally, but a cough that happens with other symptoms or lasts a long time may be a sign of a condition that needs treatment.  Take over-the-counter and prescription medicines only as told by your health care provider.  Always cover your mouth when you cough.  Contact a health care provider if you have new symptoms or a cough that does not get better after 2-3 weeks or gets worse. This information is not intended to replace advice given to you by your health care provider. Make sure you discuss any questions you have with your health care provider. Document Revised: 05/20/2018 Document Reviewed: 05/20/2018 Elsevier Patient Education   [employer].    If you have lab work done today you will be contacted with your lab results within the next 2 weeks.  If you have not heard from [place] then please contact us. The fastest way to get your results is to register for My Chart.   IF you received an x-ray today, you will receive an invoice from [facility]. Please contact [facility] at [phone] with questions or concerns regarding your invoice.   IF you received labwork today, you will receive an invoice from [place]. Please contact [facility] at [phone] with questions or concerns regarding your invoice.   Our billing staff will not be able to assist you with questions regarding bills from these companies.  You will be contacted with the lab results as soon as they are available. The fastest way to get your results is to activate your My Chart account. Instructions are located on the last page of this paperwork. If you have not heard from [place] regarding the results in 2 weeks, please contact this office.

## 2019-06-21 LAB — HIV ANTIBODY (ROUTINE TESTING W REFLEX): HIV Screen 4th Generation wRfx: NONREACTIVE

## 2019-06-21 LAB — NOVEL CORONAVIRUS, NAA: SARS-CoV-2, NAA: NOT DETECTED

## 2019-06-21 LAB — RPR: RPR Ser Ql: NONREACTIVE

## 2019-06-23 ENCOUNTER — Encounter: Payer: Self-pay | Admitting: Gastroenterology

## 2019-06-24 ENCOUNTER — Telehealth: Payer: Self-pay

## 2019-06-24 NOTE — Telephone Encounter (Signed)
Called pt and left a vm stating that we need a replacement urine sample, due to his sample not being resulted.

## 2019-06-26 ENCOUNTER — Ambulatory Visit: Payer: 59 | Admitting: Family Medicine

## 2019-07-04 ENCOUNTER — Telehealth (INDEPENDENT_AMBULATORY_CARE_PROVIDER_SITE_OTHER): Payer: 59 | Admitting: Family Medicine

## 2019-07-04 ENCOUNTER — Other Ambulatory Visit: Payer: Self-pay

## 2019-07-04 DIAGNOSIS — R05 Cough: Secondary | ICD-10-CM

## 2019-07-04 DIAGNOSIS — R0982 Postnasal drip: Secondary | ICD-10-CM

## 2019-07-04 DIAGNOSIS — K219 Gastro-esophageal reflux disease without esophagitis: Secondary | ICD-10-CM

## 2019-07-04 DIAGNOSIS — R059 Cough, unspecified: Secondary | ICD-10-CM

## 2019-07-04 NOTE — Progress Notes (Signed)
Virtual Visit via Video Note  I connected with Bradley Howe on 07/04/19 at 2:49 PM by a video enabled telemedicine application [employer] and verified that I am speaking with the correct person using two identifiers.   I discussed the limitations, risks, security and privacy concerns of performing an evaluation and management service by telephone and the availability of in person appointments. I also discussed with the patient that there may be a patient responsible charge related to this service. The patient expressed understanding and agreed to proceed, consent obtained  Chief complaint:  Chief Complaint  Patient presents with  . Follow-up    on cough and lab work. pt is still having the cough pt states no changeses to cough since last visit. no trouble breathing.      History of Present Illness: Bradley Howe is a 54 y.o. male  Cough: Discussed at his first visit with me on February 5.  Symptoms 2 to 3 months at that time, minimal improvement with Flonase for 2 weeks use, omeprazole every other day for reflux but denied active reflux symptoms.  No fevers.  3 to 4 cigarettes/week.  COVID-19 test negative on February 5, chest x-ray clear, no active cardiopulmonary disease.  Possible upper airway cough syndrome, continue Flonase, recommended daily PPI use.  Still taking omeprazole every day. flonase 1spr/nostril daily in the morning.  90% better. Still some cough at times, but much better. No dyspnea. Notices cough when lying down primarily at night. Some drainage down back of throat.    Patient Active Problem List   Diagnosis Date Noted  . Angina pectoris ([place])   . Chest pain 09/17/2015  . Type 2 diabetes mellitus without complications ([place]) [id number]  . Hypercholesterolemia 01/15/2013   Past Medical History:  Diagnosis Date  . GERD (gastroesophageal reflux disease)   . High cholesterol   . Hypertension   . Type II diabetes mellitus ([place])    Past Surgical History:  Procedure  Laterality Date  . INGUINAL HERNIA REPAIR Left 2013   No Known Allergies Prior to Admission medications   Medication Sig Start Date End Date Taking? Authorizing Provider  aspirin EC 81 MG EC tablet Take 1 tablet (81 mg total) by mouth daily. 09/18/15  Yes Jule Ser, DO  diclofenac (VOLTAREN) 75 MG EC tablet Take 1 tablet (75 mg total) by mouth 2 (two) times daily. 09/07/17  Yes Robyn Haber, MD  ibuprofen (ADVIL) 800 MG tablet Take by mouth. 10/13/14  Yes [provider]  insulin NPH-regular Human (NOVOLIN 70/30) (70-30) 100 UNIT/ML injection Inject 50 Units into the skin 2 (two) times daily with a meal. 09/18/15  Yes Jule Ser, DO  metFORMIN (GLUCOPHAGE) 500 MG tablet Take 1 tablet (500 mg total) by mouth 2 (two) times daily with a meal. 09/18/15  Yes Jule Ser, DO  omeprazole (PRILOSEC) 20 MG capsule Take by mouth. 05/12/11  Yes [provider]  pravastatin (PRAVACHOL) 40 MG tablet Take 1 tablet (40 mg total) by mouth daily. 09/18/15  Yes Jule Ser, DO  predniSONE (DELTASONE) 20 MG tablet Two daily with food 09/07/17  Yes Robyn Haber, MD   Social History   Socioeconomic History  . Marital status: Married    Spouse name: Not on file  . Number of children: Not on file  . Years of education: Not on file  . Highest education level: Not on file  Occupational History  . Not on file  Tobacco Use  . Smoking status: Former Smoker  Packs/day: 0.10    Years: 25.00    Pack years: 2.50    Types: Cigarettes  . Smokeless tobacco: Never Used  . Tobacco comment: "quit smoking cigarettes in 2013"  Substance and Sexual Activity  . Alcohol use: Yes    Alcohol/week: 12.0 standard drinks    Types: 12 Cans of beer per week    Comment: weekends  . Drug use: No  . Sexual activity: Yes  Other Topics Concern  . Not on file  Social History Narrative  . Not on file   Social Determinants of Health   Financial Resource Strain:   . Difficulty of Paying  Living Expenses: Not on file  Food Insecurity:   . Worried About Charity fundraiser in the Last Year: Not on file  . Ran Out of Food in the Last Year: Not on file  Transportation Needs:   . Lack of Transportation (Medical): Not on file  . Lack of Transportation (Non-Medical): Not on file  Physical Activity:   . Days of Exercise per Week: Not on file  . Minutes of Exercise per Session: Not on file  Stress:   . Feeling of Stress : Not on file  Social Connections:   . Frequency of Communication with Friends and Family: Not on file  . Frequency of Social Gatherings with Friends and Family: Not on file  . Attends Religious Services: Not on file  . Active Member of Clubs or Organizations: Not on file  . Attends Archivist Meetings: Not on file  . Marital Status: Not on file  Intimate Partner Violence:   . Fear of Current or Ex-Partner: Not on file  . Emotionally Abused: Not on file  . Physically Abused: Not on file  . Sexually Abused: Not on file    Observations/Objective: There were no vitals filed for this visit. Nontoxic appearance on video, no distress.  Appropriate responses, not dyspneic, and no cough during visit.  Speaking in full sentences.  All questions answered with understanding of plan expressed.  Assessment and Plan: Cough  PND (post-nasal drip)  Gastroesophageal reflux disease, unspecified whether esophagitis present Significant improvement in cough as above.  Still some cough primarily at bedtime -  suspect postnasal drip with allergic rhinitis.  We will have him increase the Flonase nasal spray to not only 1 spray in the morning in each nostril but also in the evening prior to bedtime.  Continue omeprazole daily for now.  Follow-up in 2 weeks if cough does not continue to improve/resolve.  Sooner if worse.  Paperwork sent by his employer, will review to determine follow-up visit regarding other chronic medical problems.  Follow Up Instructions:    I  discussed the assessment and treatment plan with the patient. The patient was provided an opportunity to ask questions and all were answered. The patient agreed with the plan and demonstrated an understanding of the instructions.   The patient was advised to call back or seek an in-person evaluation if the symptoms worsen or if the condition fails to improve as anticipated.  I provided 8 minutes of non-face-to-face time during this encounter.   Wendie Agreste, MD

## 2019-07-09 ENCOUNTER — Telehealth: Payer: Self-pay

## 2019-07-09 NOTE — Telephone Encounter (Signed)
Pt no show for previsit. Generic message left for call back by 9 am 07/11/19.

## 2019-07-10 NOTE — Telephone Encounter (Signed)
Pt didn't call back to reschedule PV.  Procedure cancelled and NOS letter sent to pt

## 2019-07-14 ENCOUNTER — Encounter: Payer: Self-pay | Admitting: Gastroenterology

## 2019-07-23 ENCOUNTER — Encounter: Payer: 59 | Admitting: Gastroenterology

## 2019-08-20 DIAGNOSIS — R7989 Other specified abnormal findings of blood chemistry: Secondary | ICD-10-CM | POA: Insufficient documentation

## 2019-08-20 DIAGNOSIS — E669 Obesity, unspecified: Secondary | ICD-10-CM | POA: Insufficient documentation

## 2019-08-20 DIAGNOSIS — K219 Gastro-esophageal reflux disease without esophagitis: Secondary | ICD-10-CM | POA: Insufficient documentation

## 2019-08-20 DIAGNOSIS — Z87891 Personal history of nicotine dependence: Secondary | ICD-10-CM | POA: Insufficient documentation

## 2019-08-25 ENCOUNTER — Telehealth: Payer: Self-pay | Admitting: Endocrinology

## 2019-08-25 NOTE — Telephone Encounter (Signed)
-----   Message from Elayne Snare, MD sent at 08/20/2019  2:49 PM EDT ----- Regarding: Scheduling conflict This is a diabetes patient scheduled for 4/16.  Only 30 minutes available for this appointment and needs to be 60 minutes.  Also I have 20 patients this day instead of the limit of 18

## 2019-08-25 NOTE — Telephone Encounter (Signed)
Pt was set up for this appt back in November. Have made effort to decrease schedule

## 2019-08-29 ENCOUNTER — Ambulatory Visit: Payer: 59 | Admitting: Endocrinology

## 2019-09-12 ENCOUNTER — Encounter: Payer: 59 | Admitting: Gastroenterology

## 2019-09-18 ENCOUNTER — Encounter: Payer: Self-pay | Admitting: Family Medicine

## 2019-10-03 ENCOUNTER — Emergency Department (HOSPITAL_COMMUNITY)
Admission: EM | Admit: 2019-10-03 | Discharge: 2019-10-03 | Disposition: A | Discharge status: Home / Self Care | Payer: Commercial Managed Care - PPO | Attending: Emergency Medicine | Admitting: Emergency Medicine

## 2019-10-03 ENCOUNTER — Emergency Department (HOSPITAL_COMMUNITY): Payer: Commercial Managed Care - PPO

## 2019-10-03 ENCOUNTER — Encounter (HOSPITAL_COMMUNITY): Payer: Self-pay | Admitting: Emergency Medicine

## 2019-10-03 ENCOUNTER — Ambulatory Visit: Payer: 59 | Admitting: Family Medicine

## 2019-10-03 DIAGNOSIS — M5441 Lumbago with sciatica, right side: Secondary | ICD-10-CM | POA: Insufficient documentation

## 2019-10-03 DIAGNOSIS — Z87891 Personal history of nicotine dependence: Secondary | ICD-10-CM | POA: Diagnosis not present

## 2019-10-03 DIAGNOSIS — Z794 Long term (current) use of insulin: Secondary | ICD-10-CM | POA: Diagnosis not present

## 2019-10-03 DIAGNOSIS — M5442 Lumbago with sciatica, left side: Secondary | ICD-10-CM | POA: Insufficient documentation

## 2019-10-03 DIAGNOSIS — I10 Essential (primary) hypertension: Secondary | ICD-10-CM | POA: Insufficient documentation

## 2019-10-03 DIAGNOSIS — M545 Low back pain: Secondary | ICD-10-CM | POA: Diagnosis present

## 2019-10-03 DIAGNOSIS — R079 Chest pain, unspecified: Secondary | ICD-10-CM | POA: Diagnosis not present

## 2019-10-03 DIAGNOSIS — Z79899 Other long term (current) drug therapy: Secondary | ICD-10-CM | POA: Insufficient documentation

## 2019-10-03 DIAGNOSIS — E1165 Type 2 diabetes mellitus with hyperglycemia: Secondary | ICD-10-CM | POA: Diagnosis not present

## 2019-10-03 LAB — BASIC METABOLIC PANEL
Anion gap: 17 — ABNORMAL HIGH (ref 5–15)
Anion gap: 10 (ref 5–15)
BUN: 12 mg/dL (ref 6–20)
BUN: 10 mg/dL (ref 6–20)
CO2: 16 mmol/L — ABNORMAL LOW (ref 22–32)
CO2: 22 mmol/L (ref 22–32)
Calcium: 8.5 mg/dL — ABNORMAL LOW (ref 8.9–10.3)
Calcium: 8.2 mg/dL — ABNORMAL LOW (ref 8.9–10.3)
Chloride: 100 mmol/L (ref 98–111)
Chloride: 105 mmol/L (ref 98–111)
Creatinine, Ser: 1.21 mg/dL (ref 0.61–1.24)
Creatinine, Ser: 0.96 mg/dL (ref 0.61–1.24)
GFR calc Af Amer: >60 mL/min (ref >60)
GFR calc Af Amer: >60 mL/min (ref >60)
GFR calc non Af Amer: >60 mL/min (ref >60)
GFR calc non Af Amer: >60 mL/min (ref >60)
Glucose, Bld: 337 mg/dL — ABNORMAL HIGH (ref 70–99)
Glucose, Bld: 193 mg/dL — ABNORMAL HIGH (ref 70–99)
Potassium: 4.5 mmol/L (ref 3.5–5.1)
Potassium: 4.4 mmol/L (ref 3.5–5.1)
Sodium: 133 mmol/L — ABNORMAL LOW (ref 135–145)
Sodium: 137 mmol/L (ref 135–145)

## 2019-10-03 LAB — POCT I-STAT EG7
Acid-base deficit: 2 mmol/L (ref 0.0–2.0)
Bicarbonate: 23.4 mmol/L (ref 20.0–28.0)
Calcium, Ion: 1.13 mmol/L — ABNORMAL LOW (ref 1.15–1.40)
HCT: 46 % (ref 39.0–52.0)
Hemoglobin: 15.6 g/dL (ref 13.0–17.0)
O2 Saturation: 83 %
Potassium: 4.2 mmol/L (ref 3.5–5.1)
Sodium: 134 mmol/L — ABNORMAL LOW (ref 135–145)
TCO2: 25 mmol/L (ref 22–32)
pCO2, Ven: 40.4 mmHg — ABNORMAL LOW (ref 44.0–60.0)
pH, Ven: 7.37 (ref 7.250–7.430)
pO2, Ven: 48 mmHg — ABNORMAL HIGH (ref 32.0–45.0)

## 2019-10-03 LAB — CBC
HCT: 45.2 % (ref 39.0–52.0)
Hemoglobin: 15.1 g/dL (ref 13.0–17.0)
MCH: 29 pg (ref 26.0–34.0)
MCHC: 33.4 g/dL (ref 30.0–36.0)
MCV: 86.9 fL (ref 80.0–100.0)
Platelets: 270 10*3/uL (ref 150–400)
RBC: 5.2 MIL/uL (ref 4.22–5.81)
RDW: 13.2 % (ref 11.5–15.5)
WBC: 8.5 10*3/uL (ref 4.0–10.5)
nRBC: 0 % (ref 0.0–0.2)

## 2019-10-03 LAB — CBG MONITORING, ED: Glucose-Capillary: 217 mg/dL — ABNORMAL HIGH (ref 70–99)

## 2019-10-03 LAB — TROPONIN I (HIGH SENSITIVITY)
Troponin I (High Sensitivity): 11 ng/L (ref <18)
Troponin I (High Sensitivity): 11 ng/L (ref <18)

## 2019-10-03 MED ORDER — INSULIN ASPART 100 UNIT/ML ~~LOC~~ SOLN: 8.0000 [IU] | Freq: Once | SUBCUTANEOUS | Status: DC

## 2019-10-03 MED ORDER — SODIUM CHLORIDE 0.9% FLUSH: 3.0000 mL | Freq: Once | INTRAVENOUS | Status: DC

## 2019-10-03 MED ORDER — LIDOCAINE 5 % EX PTCH: 1.0000 | MEDICATED_PATCH | CUTANEOUS | 0 refills | Status: DC

## 2019-10-03 MED ORDER — SODIUM CHLORIDE 0.9 % IV BOLUS
1000.0000 mL | Freq: Once | INTRAVENOUS | Status: AC
  Administered 2019-10-03: 1000 mL via INTRAVENOUS

## 2019-10-03 MED ORDER — NAPROXEN 500 MG PO TABS
500.0000 mg | ORAL_TABLET | Freq: Two times a day (BID) | ORAL | 0 refills | Status: DC
Start: 2019-10-03 — End: 2019-10-15

## 2019-10-03 MED ORDER — KETOROLAC TROMETHAMINE 60 MG/2ML IM SOLN: 30.0000 mg | Freq: Once | INTRAMUSCULAR | Status: DC

## 2019-10-03 MED ORDER — KETOROLAC TROMETHAMINE 15 MG/ML IJ SOLN
15.0000 mg | Freq: Once | INTRAMUSCULAR | Status: AC
  Administered 2019-10-03: 15 mg via INTRAVENOUS
  Filled 2019-10-03: qty 1

## 2019-10-03 MED ORDER — METHOCARBAMOL 500 MG PO TABS
500.0000 mg | ORAL_TABLET | Freq: Two times a day (BID) | ORAL | 0 refills | Status: DC
Start: 2019-10-03 — End: 2019-10-15

## 2019-10-03 NOTE — ED Triage Notes (Signed)
Pt has c/o of low back pain that seemed to get worse over the last 3 days- pt works at [employer]	[place]. Pt then adds last nigh" I had a soreness in the center of his chest that wokeme up twice but woke up and it was resolved". Pt has had a dry cough for over 1 month but thought this was allergies. Has taken 4 neg covid tests.  At this time pt has no chest pain is mostly concerned about his low back pain radiating into right leg.

## 2019-10-03 NOTE — Discharge Instructions (Signed)
Please pick up medications for your back pain and take as prescribed. It is recommended that you take the muscle relaxer (Robaxin) at nighttime to help you sleep - this medication can make you drowsy. Do not drive while on this medication to prevent any accidents due to drowsiness.   Follow up with your PCP regarding your ED visit today specifically regarding your elevated blood sugars   Return to the ED IMMEDIATELY for any worsening symptoms including worsening pain, inability to feel your groin area, holding onto urine, peeing or pooping on yourself, inability to walk, numbness/weakness in your legs, fevers > 100.4, or any other new/concerning symptoms.

## 2019-10-03 NOTE — ED Notes (Signed)
CBG Results of 217 reported to [place], RN.

## 2019-10-03 NOTE — ED Provider Notes (Signed)
[facility] EMERGENCY DEPARTMENT Provider Note   CSN: [id number] Arrival date & time: 10/03/19  [id number]     History Chief Complaint  Patient presents with  . Back Pain    Bradley Howe is a 54 y.o. male with PMHx HTN, high cholesterol, GERD, diabetes who presents to the ED today with complaint of gradual onset, constant, gradually worsening, achy, lower back pain radiating down  BLEs x 1.5 months.  Endorses history of chronic lower back pain for several years, was diagnosed with sciatica.  States that he was laid off of work however returned recently which he believes is contributing to his recurrence of back pain.  Been taking over-the-counter medications without relief.  Does also mention that he has had remittent chest pain that is woke him up twice out of his sleep in the past couple of days.  Patient states he has had a dry cough for over 1 month which he attributes to allergies and states this is why his chest has been hurting.  He does not have any current active chest pain.  Denies any shortness of breath with this.  Denies any history of heart disease or family history.  Is a former smoker.  Denies fevers, chills, shortness of breath, leg swelling, palpitations, hemoptysis, any other associated symptoms.  No recent prolonged travel or immobilization.  No history of DVT/PE.  No exogenous hormone use.  No active malignancy.  He did receive his first dose of the Pfizer vaccine approximately 3 weeks ago, was scheduled to get his second dose today.   The history is provided by the patient and medical records.       Past Medical History:  Diagnosis Date  . GERD (gastroesophageal reflux disease)   . High cholesterol   . Hypertension   . Type II diabetes mellitus [facility])     Patient Active Problem List   Diagnosis Date Noted  . Obesity, unspecified 08/20/2019  . Ex-smoker 08/20/2019  . GERD (gastroesophageal reflux disease) 08/20/2019  . Elevated LFTs 08/20/2019  .  Angina pectoris ([place])   . Chest pain 09/17/2015  . Type 2 diabetes mellitus without complications ([place]) [id number]  . Hypercholesterolemia 01/15/2013  . Renal insufficiency 07/26/2012  . Hyponatremia 07/26/2012  . Essential (primary) hypertension 07/26/2012  . Left inguinal hernia 05/19/2011    Past Surgical History:  Procedure Laterality Date  . INGUINAL HERNIA REPAIR Left 2013       Family History  Problem Relation Age of Onset  . Hypertension Mother   . Hypertension Father   . Diabetes Paternal Grandmother   . Heart disease Neg Hx     Social History   Tobacco Use  . Smoking status: Former Smoker    Packs/day: 0.10    Years: 25.00    Pack years: 2.50    Types: Cigarettes  . Smokeless tobacco: Never Used  . Tobacco comment: "quit smoking cigarettes in 2013"  Substance Use Topics  . Alcohol use: Yes    Alcohol/week: 12.0 standard drinks    Types: 12 Cans of beer per week    Comment: weekends  . Drug use: No    Home Medications Prior to Admission medications   Medication Sig Start Date End Date Taking? Authorizing Provider  aspirin EC 81 MG EC tablet Take 1 tablet (81 mg total) by mouth daily. 09/18/15   Jule Ser, DO  diclofenac (VOLTAREN) 75 MG EC tablet Take 1 tablet (75 mg total) by mouth 2 (two) times daily. 09/07/17  Robyn Haber, MD  ibuprofen (ADVIL) 800 MG tablet Take by mouth. 10/13/14   [provider]  insulin NPH-regular Human (NOVOLIN 70/30) (70-30) 100 UNIT/ML injection Inject 50 Units into the skin 2 (two) times daily with a meal. 09/18/15   Jule Ser, DO  lidocaine (LIDODERM) 5 % Place 1 patch onto the skin daily. Remove & Discard patch within 12 hours or as directed by MD 10/03/19   Eustaquio Maize, PA-C  metFORMIN (GLUCOPHAGE) 500 MG tablet Take 1 tablet (500 mg total) by mouth 2 (two) times daily with a meal. 09/18/15   Jule Ser, DO  methocarbamol (ROBAXIN) 500 MG tablet Take 1 tablet (500 mg total) by mouth 2 (two) times  daily. 10/03/19   Alroy Bailiff, Jahzier Villalon, PA-C  naproxen (NAPROSYN) 500 MG tablet Take 1 tablet (500 mg total) by mouth 2 (two) times daily. 10/03/19   Eustaquio Maize, PA-C  omeprazole (PRILOSEC) 20 MG capsule Take by mouth. 05/12/11   [provider]  pravastatin (PRAVACHOL) 40 MG tablet Take 1 tablet (40 mg total) by mouth daily. 09/18/15   Jule Ser, DO  predniSONE (DELTASONE) 20 MG tablet Two daily with food 09/07/17   Robyn Haber, MD    Allergies    Patient has no known allergies.  Review of Systems   Review of Systems  Constitutional: Negative for chills and fever.  Respiratory: Positive for cough. Negative for shortness of breath.   Cardiovascular: Positive for chest pain.  Gastrointestinal: Negative for nausea and vomiting.  Musculoskeletal: Positive for arthralgias and back pain.  All other systems reviewed and are negative.   Physical Exam Updated Vital Signs BP 139/88 (BP Location: Right Arm)   Pulse 85   Temp 98 F (36.7 C) (Oral)   Resp 19   Ht 5' 6.5" (1.689 m)   Wt 102.1 kg   SpO2 100%   BMI 35.77 kg/m   Physical Exam Vitals and nursing note reviewed.  Constitutional:      Appearance: He is not ill-appearing or diaphoretic.  HENT:     Head: Normocephalic and atraumatic.  Eyes:     Conjunctiva/sclera: Conjunctivae normal.  Cardiovascular:     Rate and Rhythm: Normal rate and regular rhythm.     Pulses: Normal pulses.  Pulmonary:     Effort: Pulmonary effort is normal.     Breath sounds: Normal breath sounds. No wheezing, rhonchi or rales.  Abdominal:     Palpations: Abdomen is soft.     Tenderness: There is no abdominal tenderness. There is no guarding or rebound.  Musculoskeletal:     Cervical back: Neck supple.     Comments: No C, T, or L midline spinal TTP. + Left lumbar paraspinal musculature TTP. + SLR on L. Negative SLR on R. ROM intact to neck and back. Strength 5/5 to BLEs. Sensation intact throughout. 2+ PT pulses bilaterally.     Skin:    General: Skin is warm and dry.  Neurological:     Mental Status: He is alert.     ED Results / Procedures / Treatments   Labs (all labs ordered are listed, but only abnormal results are displayed) Labs Reviewed  BASIC METABOLIC PANEL - Abnormal; Notable for the following components:      Result Value   Sodium 133 (*)    CO2 16 (*)    Glucose, Bld 337 (*)    Calcium 8.5 (*)    Anion gap 17 (*)    All other components within normal limits  BASIC METABOLIC PANEL - Abnormal; Notable for the following components:   Glucose, Bld 193 (*)    Calcium 8.2 (*)    All other components within normal limits  POCT I-STAT EG7 - Abnormal; Notable for the following components:   pCO2, Ven 40.4 (*)    pO2, Ven 48.0 (*)    Sodium 134 (*)    Calcium, Ion 1.13 (*)    All other components within normal limits  CBG MONITORING, ED - Abnormal; Notable for the following components:   Glucose-Capillary 217 (*)    All other components within normal limits  CBC  BLOOD GAS, VENOUS  TROPONIN I (HIGH SENSITIVITY)  TROPONIN I (HIGH SENSITIVITY)    EKG EKG Interpretation  Date/Time:  Friday Oct 03 2019 11:42:47 EDT Ventricular Rate:  83 PR Interval:  142 QRS Duration: 79 QT Interval:  391 QTC Calculation: 460 R Axis:   53 Text Interpretation: Sinus rhythm no acute ST/T changes Confirmed by Sherwood Gambler [phone]) on 10/03/2019 1:29:34 PM   Radiology DG Chest 2 View  Result Date: 10/03/2019 CLINICAL DATA:  Cough in central chest pain since yesterday. Ex-smoker. EXAM: CHEST - 2 VIEW COMPARISON:  06/20/2019 FINDINGS: Normal sized heart. Clear lungs. Stable minimal peribronchial thickening and accentuation of the interstitial markings. Minimal lower thoracic spine degenerative changes. IMPRESSION: No acute abnormality. Stable minimal chronic bronchitic changes. Electronically Signed   By: Claudie Revering M.D.   On: 10/03/2019 09:29    Procedures Procedures (including critical care  time)  Medications Ordered in ED Medications  sodium chloride flush (NS) 0.9 % injection 3 mL (3 mLs Intravenous Not Given 10/03/19 1145)  insulin aspart (novoLOG) injection 8 Units (0 Units Subcutaneous Hold 10/03/19 1312)  sodium chloride 0.9 % bolus 1,000 mL (1,000 mLs Intravenous New Bag/Given 10/03/19 1241)  ketorolac (TORADOL) 15 MG/ML injection 15 mg (15 mg Intravenous Given 10/03/19 1306)    ED Course  I have reviewed the triage vital signs and the nursing notes.  Pertinent labs & imaging results that were available during my care of the patient were reviewed by me and considered in my medical decision making (see chart for details).  Clinical Course as of Oct 02 1528  Fri Oct 03, 2019  1309 Glucose-Capillary(!): 217 [MV]    Clinical Course User Index [MV] Eustaquio Maize, [place]   MDM Rules/Calculators/A&P                      54 year old male presents the ED today complaining of lower back pain x1.5 months that radiates down his bilateral lower extremities.  History of sciatica.  Also mentions intermittent chest pain for the past several days.  On arrival to the ED patient is afebrile, nontachycardic and nontachypneic.  He appears to be in no acute distress.  He is not having any active chest pain.    Was worked up for his chest pain while in the waiting room. X-ray with no acute changes No acute changes to EKG.  CBC without leukocytosis. Hgb stable.  BMP with sodium 133. Glucose 337. Bicarb 16. And Gap of 17. Pt states he missed his novolog doses last night and this morning. Will add on VBG at this time. Will provide fluids and 8 Units insulin and reassess. Creatinine stable at 1.21.  Initial troponin of 11 - will repeat.   On exam patient has some left lumbar paraspinal musculature tenderness with positive straight leg raise on left.  Will treat for sciatica at this time  with toradol and d/c with robaxin and naproxen.  Given patient's uncontrolled diabetes do not feel that  prednisone would be beneficial today. Cannot find last a1c in our system.   After pt received 500 CCs fluids, CBG was checked with decrease to 217. Insulin held at this point.  VBG without signs of DKA. Will repeat BMP after fluid bolus and if no longer acidotic pt can be discharged home.   Repeat BMP with glucose 193, bicarb 22, gap of 10. Feel pt is stable for discharge at this time. On reevaluation pt reports his pain has improved with toradol. We dicussed risks vs benefits of prednisone - pt reports last a1c 10.7 3 months ago without changed in medications. Given this will hold off on steroids at this time. Pt to be discharged with muscle relaxers and anti inflammatories and lidocaine patch. He is advised to follow up with his PCP. Strict return precautions have been discussed with pt. He is in agreement with plan and stable for discharge home.   This note was prepared using Dragon voice recognition software and may include unintentional dictation errors due to the inherent limitations of voice recognition software.  Final Clinical Impression(s) / ED Diagnoses Final diagnoses:  Acute bilateral low back pain with bilateral sciatica  Type 2 diabetes mellitus with hyperglycemia, with long-term current use of insulin ([place])    Rx / DC Orders ED Discharge Orders         Ordered    methocarbamol (ROBAXIN) 500 MG tablet  2 times daily     10/03/19 1525    naproxen (NAPROSYN) 500 MG tablet  2 times daily     10/03/19 1525    lidocaine (LIDODERM) 5 %  Every 24 hours     10/03/19 1525           Discharge Instructions     Please pick up medications for your back pain and take as prescribed. It is recommended that you take the muscle relaxer (Robaxin) at nighttime to help you sleep - this medication can make you drowsy. Do not drive while on this medication to prevent any accidents due to drowsiness.   Follow up with your PCP regarding your ED visit today specifically regarding your elevated  blood sugars   Return to the ED IMMEDIATELY for any worsening symptoms including worsening pain, inability to feel your groin area, holding onto urine, peeing or pooping on yourself, inability to walk, numbness/weakness in your legs, fevers > 100.4, or any other new/concerning symptoms.        Eustaquio Maize, PA-C 10/03/19 [place], MD 10/03/19 [phone]

## 2019-10-15 ENCOUNTER — Encounter (HOSPITAL_COMMUNITY): Payer: Self-pay | Admitting: Family Medicine

## 2019-10-15 ENCOUNTER — Other Ambulatory Visit: Payer: Self-pay

## 2019-10-15 ENCOUNTER — Ambulatory Visit (HOSPITAL_COMMUNITY)
Admission: EM | Admit: 2019-10-15 | Discharge: 2019-10-15 | Disposition: A | Discharge status: Home / Self Care | Payer: Commercial Managed Care - PPO

## 2019-10-15 DIAGNOSIS — G8929 Other chronic pain: Secondary | ICD-10-CM

## 2019-10-15 DIAGNOSIS — M5441 Lumbago with sciatica, right side: Secondary | ICD-10-CM | POA: Diagnosis not present

## 2019-10-15 DIAGNOSIS — M5442 Lumbago with sciatica, left side: Secondary | ICD-10-CM | POA: Diagnosis not present

## 2019-10-15 MED ORDER — CYCLOBENZAPRINE HCL 10 MG PO TABS
10.0000 mg | ORAL_TABLET | Freq: Two times a day (BID) | ORAL | 0 refills | Status: DC | PRN
Start: 2019-10-15 — End: 2019-12-19

## 2019-10-15 MED ORDER — PREDNISONE 10 MG (21) PO TBPK
ORAL_TABLET | ORAL | 0 refills | Status: DC
Start: 2019-10-15 — End: 2019-12-19

## 2019-10-15 NOTE — Discharge Instructions (Addendum)
Treating your back pain is sciatic nerve pain with prednisone taper over the next 6 days.  Make sure you take this with food.  Watch her blood sugars because this may increase her blood sugars. Flexeril for muscle relaxant as needed.  This may make you drowsy.  Try taking this at bedtime to see how you feel If your symptoms continue please follow-up with your primary care doctor Rest, heat to the back and gentle stretching can help

## 2019-10-15 NOTE — ED Triage Notes (Signed)
Pt c/o 8/10 stabbing lower back painx2 wks. Pt states the pain shoots down both legs. PT denies injury, but does state his job requires him to lift heavy things. Pt denies numbness and tingling. Pt denies urinary issues. Pt was able to walk to exam room well.

## 2019-10-17 NOTE — ED Provider Notes (Signed)
Bradley Howe    CSN: [id number] Arrival date & time: 10/15/19  2585      History   Chief Complaint Chief Complaint  Patient presents with  . Back Pain    HPI Bradley Howe is a 54 y.o. male.   Pt is a 54 year old male that presents with chronic low back pain x2 weeks.  8 out of 10 stabbing lower back pain.  Pain radiates down bilateral upper legs.  Denies any injuries or falls but does heavy lifting at work.  Denies any associated numbness, tingling, weakness, loss of bowel or bladder function.  Able to ambulate without any difficulty.  Has not taken any medication for his symptoms.  Was seen previously and treated for this and has run out of the medication that was prescribed previously.  This medicine did seem to help.  ROS per HPI      Past Medical History:  Diagnosis Date  . GERD (gastroesophageal reflux disease)   . High cholesterol   . Hypertension   . Type II diabetes mellitus [facility])     Patient Active Problem List   Diagnosis Date Noted  . Obesity, unspecified 08/20/2019  . Ex-smoker 08/20/2019  . GERD (gastroesophageal reflux disease) 08/20/2019  . Elevated LFTs 08/20/2019  . Angina pectoris ([place])   . Chest pain 09/17/2015  . Type 2 diabetes mellitus without complications ([place]) 27/78/2423  . Hypercholesterolemia 01/15/2013  . Renal insufficiency 07/26/2012  . Hyponatremia 07/26/2012  . Essential (primary) hypertension 07/26/2012  . Left inguinal hernia 05/19/2011    Past Surgical History:  Procedure Laterality Date  . INGUINAL HERNIA REPAIR Left 2013       Home Medications    Prior to Admission medications   Medication Sig Start Date End Date Taking? Authorizing Provider  aspirin EC 81 MG EC tablet Take 1 tablet (81 mg total) by mouth daily. 09/18/15   Jule Ser, DO  cyclobenzaprine (FLEXERIL) 10 MG tablet Take 1 tablet (10 mg total) by mouth 2 (two) times daily as needed for muscle spasms. 10/15/19   Loura Halt A, NP  gabapentin  (NEURONTIN) 100 MG capsule 100 mg. 09/03/19   [provider]  glipiZIDE (GLUCOTROL) 5 MG tablet Take 5 mg by mouth 2 (two) times daily. 05/06/19   [provider]  insulin NPH-regular Human (NOVOLIN 70/30) (70-30) 100 UNIT/ML injection Inject 50 Units into the skin 2 (two) times daily with a meal. 09/18/15   Jule Ser, DO  lidocaine (LIDODERM) 5 % Place 1 patch onto the skin daily. Remove & Discard patch within 12 hours or as directed by MD 10/03/19   Eustaquio Maize, PA-C  lisinopril (ZESTRIL) 10 MG tablet Take 10 mg by mouth daily. 04/28/19   [provider]  metFORMIN (GLUCOPHAGE) 500 MG tablet Take 1 tablet (500 mg total) by mouth 2 (two) times daily with a meal. 09/18/15   Jule Ser, DO  omeprazole (PRILOSEC) 20 MG capsule Take by mouth. 05/12/11   [provider]  pravastatin (PRAVACHOL) 40 MG tablet Take 1 tablet (40 mg total) by mouth daily. 09/18/15   Jule Ser, DO  predniSONE (STERAPRED UNI-PAK 21 TAB) 10 MG (21) TBPK tablet 6 tabs for 1 day, then 5 tabs for 1 das, then 4 tabs for 1 day, then 3 tabs for 1 day, 2 tabs for 1 day, then 1 tab for 1 day 10/15/19   Loura Halt A, NP  rosuvastatin (CRESTOR) 20 MG tablet Take 20 mg by mouth daily.  09/03/19   [provider]    Family History Family History  Problem Relation Age of Onset  . Hypertension Mother   . Hypertension Father   . Diabetes Paternal Grandmother   . Heart disease Neg Hx     Social History Social History   Tobacco Use  . Smoking status: Former Smoker    Packs/day: 0.10    Years: 25.00    Pack years: 2.50    Types: Cigarettes  . Smokeless tobacco: Never Used  . Tobacco comment: "quit smoking cigarettes in 2013"  Substance Use Topics  . Alcohol use: Yes    Comment: occ  . Drug use: No     Allergies   Patient has no known allergies.   Review of Systems Review of Systems   Physical Exam Triage Vital Signs ED Triage Vitals  Enc Vitals Group     BP  10/15/19 0853 110/74     Pulse Rate 10/15/19 0853 98     Resp 10/15/19 0853 18     Temp 10/15/19 0853 98.3 F (36.8 C)     Temp Source 10/15/19 0853 Oral     SpO2 10/15/19 0853 97 %     Weight 10/15/19 0856 223 lb (101.2 kg)     Height 10/15/19 0856 5\' 7"  (1.702 m)     Head Circumference --      Peak Flow --      Pain Score 10/15/19 0856 8     Pain Loc --      Pain Edu? --      Excl. in [place]? --    No data found.  Updated Vital Signs BP 110/74 (BP Location: Left Arm)   Pulse 98   Temp 98.3 F (36.8 C) (Oral)   Resp 18   Ht 5\' 7"  (1.702 m)   Wt 223 lb (101.2 kg)   SpO2 97%   BMI 34.93 kg/m   Visual Acuity Right Eye Distance:   Left Eye Distance:   Bilateral Distance:    Right Eye Near:   Left Eye Near:    Bilateral Near:     Physical Exam Vitals and nursing note reviewed.  Constitutional:      Appearance: Normal appearance.  HENT:     Head: Normocephalic and atraumatic.     Nose: Nose normal.  Eyes:     Conjunctiva/sclera: Conjunctivae normal.  Pulmonary:     Effort: Pulmonary effort is normal.  Musculoskeletal:     Cervical back: Normal range of motion.     Lumbar back: Tenderness present. No bony tenderness. Positive right straight leg raise test and positive left straight leg raise test.       Back:  Skin:    General: Skin is warm and dry.  Neurological:     Mental Status: He is alert.  Psychiatric:        Mood and Affect: Mood normal.      UC Treatments / Results  Labs (all labs ordered are listed, but only abnormal results are displayed) Labs Reviewed - No data to display  EKG   Radiology No results found.  Procedures Procedures (including critical care time)  Medications Ordered in UC Medications - No data to display  Initial Impression / Assessment and Plan / UC Course  I have reviewed the triage vital signs and the nursing notes.  Pertinent labs & imaging results that were available during my care of the patient were reviewed  by me and considered in my medical  decision making (see chart for details).     Chronic back pain with sciatica Treating with prednisone taper over the next 6 days Flexeril for muscle relaxant as needed Recommended rest, heat and gentle stretching. Follow up as needed for continued or worsening symptoms  Final Clinical Impressions(s) / UC Diagnoses   Final diagnoses:  Chronic bilateral low back pain with bilateral sciatica     Discharge Instructions     Treating your back pain is sciatic nerve pain with prednisone taper over the next 6 days.  Make sure you take this with food.  Watch her blood sugars because this may increase her blood sugars. Flexeril for muscle relaxant as needed.  This may make you drowsy.  Try taking this at bedtime to see how you feel If your symptoms continue please follow-up with your primary care doctor Rest, heat to the back and gentle stretching can help    ED Prescriptions    Medication Sig Dispense Auth. Provider   predniSONE (STERAPRED UNI-PAK 21 TAB) 10 MG (21) TBPK tablet 6 tabs for 1 day, then 5 tabs for 1 das, then 4 tabs for 1 day, then 3 tabs for 1 day, 2 tabs for 1 day, then 1 tab for 1 day 21 tablet Collier Monica A, NP   cyclobenzaprine (FLEXERIL) 10 MG tablet Take 1 tablet (10 mg total) by mouth 2 (two) times daily as needed for muscle spasms. 20 tablet Loura Halt A, NP     PDMP not reviewed this encounter.   Loura Halt A, NP 10/17/19 [phone]

## 2019-12-19 ENCOUNTER — Other Ambulatory Visit: Payer: Self-pay

## 2019-12-19 ENCOUNTER — Ambulatory Visit: Payer: Commercial Managed Care - PPO | Admitting: Family Medicine

## 2019-12-19 ENCOUNTER — Encounter: Payer: Self-pay | Admitting: Family Medicine

## 2019-12-19 VITALS — BP 148/84 | HR 97 | Temp 97.3°F | Ht 67.0 in | Wt 230.8 lb

## 2019-12-19 DIAGNOSIS — E1165 Type 2 diabetes mellitus with hyperglycemia: Secondary | ICD-10-CM | POA: Diagnosis not present

## 2019-12-19 DIAGNOSIS — Z794 Long term (current) use of insulin: Secondary | ICD-10-CM

## 2019-12-19 DIAGNOSIS — Z1159 Encounter for screening for other viral diseases: Secondary | ICD-10-CM

## 2019-12-19 DIAGNOSIS — Z1211 Encounter for screening for malignant neoplasm of colon: Secondary | ICD-10-CM | POA: Diagnosis not present

## 2019-12-19 DIAGNOSIS — E785 Hyperlipidemia, unspecified: Secondary | ICD-10-CM

## 2019-12-19 DIAGNOSIS — Z23 Encounter for immunization: Secondary | ICD-10-CM

## 2019-12-19 DIAGNOSIS — E1149 Type 2 diabetes mellitus with other diabetic neurological complication: Secondary | ICD-10-CM

## 2019-12-19 MED ORDER — GLIPIZIDE 5 MG PO TABS: 5.0000 mg | ORAL_TABLET | Freq: Two times a day (BID) | ORAL | 1 refills | Status: DC

## 2019-12-19 MED ORDER — LISINOPRIL 10 MG PO TABS: 10.0000 mg | ORAL_TABLET | Freq: Every day | ORAL | 1 refills | Status: DC

## 2019-12-19 MED ORDER — ROSUVASTATIN CALCIUM 20 MG PO TABS: 20.0000 mg | ORAL_TABLET | Freq: Every day | ORAL | 1 refills | Status: DC

## 2019-12-19 MED ORDER — INSULIN NPH ISOPHANE & REGULAR (70-30) 100 UNIT/ML ~~LOC~~ SUSP: 52.0000 [IU] | Freq: Two times a day (BID) | SUBCUTANEOUS | 6 refills | Status: DC

## 2019-12-19 MED ORDER — GABAPENTIN 100 MG PO CAPS: 100.0000 mg | ORAL_CAPSULE | Freq: Every day | ORAL | 1 refills | Status: DC

## 2019-12-19 NOTE — Progress Notes (Signed)
Subjective:  Patient ID: Bradley Howe, male    DOB: 01/19/1966  Age: 54 y.o. MRN: [id number]  CC:  Chief Complaint  Patient presents with  . Medical Management of Chronic Issues    f/u   . Diabetes    f/u     HPI Bradley Howe presents for   Diabetes: Complicated by hyperglycemia, neuropathy,  last seen in February. Has testing at prior work, reported A1c approximately 10.1 at his February visit.  He was on Metformin 500 mg twice daily, 70/30 insulin 50 units twice daily, glipizide 5 mg twice daily  Never saw endocrinology. No recnet visit - last nurse visit at work in January.  Insurance lapse with work change.  No lab at current job.  He is on statin, ACE inhibitor. Stopped metformin 6 months ago - GI upset, diarrhea.  Taking glipizide 4m bid and 70/30 insulin still 50 units BID.  Gabapentin 1073mQHS. Helps nerve pain feet - doing well.  Fasting: 179-230.  Postprandial - up to 315.  No symptomatic lows. Lowest in 170's recently.  No n/v, abd pain, blurry vision.  Microalbumin: today.  Optho, foot exam, pneumovax: due for optho- he will schedule.  Pneumovax today. No FH of colon CA or personal hx of colon CA. Requests cologuard.   Lab Results  Component Value Date   HGBA1C 11.2 (H) 09/17/2015   HGBA1C =>14% 01/15/2013   Lab Results  Component Value Date   LDLCALC 82 09/17/2015   CREATININE 0.96 10/03/2019   Hypertension: Ran out lisinopril 1 week ago.  No Home readings.  BP Readings from Last 3 Encounters:  12/19/19 (!) 148/84  10/15/19 110/74  10/03/19 139/88   Lab Results  Component Value Date   CREATININE 0.96 10/03/2019   GERD: Omeprazole taking daily  - working well, rare breakthrough heartburn with certain foods.   Chronic back pain: Off and on for years.  mobic in past, unknown kidney function - high in past..  No bowel or bladder incontinence, no saddle anesthesia, no lower extremity weakness.  No recent changes - plans to discuss next visit.      Hyperlipidemia: Crestor 20 mg daily, has labs performed at work prior.  Still taking, no new side effects/myalgias.  Lab Results  Component Value Date   CHOL 175 09/17/2015   HDL 29 (L) 09/17/2015   LDLCALC 82 09/17/2015   TRIG 318 (H) 09/17/2015   CHOLHDL 6.0 09/17/2015   Lab Results  Component Value Date   ALT 28 09/17/2015   AST 34 09/17/2015   ALKPHOS 54 09/17/2015   BILITOT 0.6 09/17/2015      History Patient Active Problem List   Diagnosis Date Noted  . Obesity, unspecified 08/20/2019  . Ex-smoker 08/20/2019  . GERD (gastroesophageal reflux disease) 08/20/2019  . Elevated LFTs 08/20/2019  . Angina pectoris (H[facility]  . Chest pain 09/17/2015  . Type 2 diabetes mellitus without complications ([facility]ast [id number]. Hypercholesterolemia 01/15/2013  . Renal insufficiency 07/26/2012  . Hyponatremia 07/26/2012  . Essential (primary) hypertension 07/26/2012  . Left inguinal hernia 05/19/2011   Past Medical History:  Diagnosis Date  . GERD (gastroesophageal reflux disease)   . High cholesterol   . Hypertension   . Type II diabetes mellitus ([facility]   Past Surgical History:  Procedure Laterality Date  . INGUINAL HERNIA REPAIR Left 2013   No Known Allergies Prior to Admission medications   Medication Sig Start Date End Date Taking? Authorizing Provider  gabapentin (NEURONTIN) 100  MG capsule 100 mg. 09/03/19  Yes [provider]  glipiZIDE (GLUCOTROL) 5 MG tablet Take 5 mg by mouth 2 (two) times daily. 05/06/19  Yes [provider]  insulin NPH-regular Human (NOVOLIN 70/30) (70-30) 100 UNIT/ML injection Inject 50 Units into the skin 2 (two) times daily with a meal. 09/18/15  Yes Jule Ser, DO  lisinopril (ZESTRIL) 10 MG tablet Take 10 mg by mouth daily. 04/28/19  Yes [provider]  omeprazole (PRILOSEC) 20 MG capsule Take by mouth. 05/12/11  Yes [provider]  rosuvastatin (CRESTOR) 20 MG tablet Take 20 mg by mouth daily.  09/03/19  Yes [provider]  aspirin EC 81 MG EC tablet Take 1 tablet (81 mg total) by mouth daily. Patient not taking: Reported on 12/19/2019 09/18/15   Jule Ser, DO  cyclobenzaprine (FLEXERIL) 10 MG tablet Take 1 tablet (10 mg total) by mouth 2 (two) times daily as needed for muscle spasms. Patient not taking: Reported on 12/19/2019 10/15/19   Loura Halt A, NP  lidocaine (LIDODERM) 5 % Place 1 patch onto the skin daily. Remove & Discard patch within 12 hours or as directed by MD Patient not taking: Reported on 12/19/2019 10/03/19   Eustaquio Maize, PA-C  metFORMIN (GLUCOPHAGE) 500 MG tablet Take 1 tablet (500 mg total) by mouth 2 (two) times daily with a meal. Patient not taking: Reported on 12/19/2019 09/18/15   Jule Ser, DO  predniSONE (STERAPRED UNI-PAK 21 TAB) 10 MG (21) TBPK tablet 6 tabs for 1 day, then 5 tabs for 1 das, then 4 tabs for 1 day, then 3 tabs for 1 day, 2 tabs for 1 day, then 1 tab for 1 day Patient not taking: Reported on 12/19/2019 10/15/19   Orvan July, NP   Social History   Socioeconomic History  . Marital status: Married    Spouse name: Not on file  . Number of children: Not on file  . Years of education: Not on file  . Highest education level: Not on file  Occupational History  . Not on file  Tobacco Use  . Smoking status: Former Smoker    Packs/day: 0.10    Years: 25.00    Pack years: 2.50    Types: Cigarettes  . Smokeless tobacco: Never Used  . Tobacco comment: "quit smoking cigarettes in 2013"  Substance and Sexual Activity  . Alcohol use: Yes    Comment: occ  . Drug use: No  . Sexual activity: Yes  Other Topics Concern  . Not on file  Social History Narrative  . Not on file   Social Determinants of Health   Financial Resource Strain:   . Difficulty of Paying Living Expenses:   Food Insecurity:   . Worried About Charity fundraiser in the Last Year:   . Arboriculturist in the Last Year:   Transportation Needs:   . Lexicographer (Medical):   Marland Kitchen Lack of Transportation (Non-Medical):   Physical Activity:   . Days of Exercise per Week:   . Minutes of Exercise per Session:   Stress:   . Feeling of Stress :   Social Connections:   . Frequency of Communication with Friends and Family:   . Frequency of Social Gatherings with Friends and Family:   . Attends Religious Services:   . Active Member of Clubs or Organizations:   . Attends Archivist Meetings:   Marland Kitchen Marital Status:   Intimate Partner Violence:   .  Fear of Current or Ex-Partner:   . Emotionally Abused:   Marland Kitchen Physically Abused:   . Sexually Abused:     Review of Systems  Constitutional: Negative for fatigue and unexpected weight change.  Eyes: Negative for visual disturbance.  Respiratory: Negative for cough, chest tightness and shortness of breath.   Cardiovascular: Negative for chest pain, palpitations and leg swelling.  Gastrointestinal: Negative for abdominal pain and blood in stool.  Neurological: Negative for dizziness, light-headedness and headaches.     Objective:   Vitals:   12/19/19 1441 12/19/19 1458  BP: (!) 152/92 (!) 148/84  Pulse: 97   Temp: (!) 97.3 F (36.3 C)   TempSrc: Temporal   SpO2: 95%   Weight: 230 lb 12.8 oz (104.7 kg)   Height: 5' 7"  (1.702 m)      Physical Exam Vitals reviewed.  Constitutional:      Appearance: He is well-developed.  HENT:     Head: Normocephalic and atraumatic.  Eyes:     Pupils: Pupils are equal, round, and reactive to light.  Neck:     Vascular: No carotid bruit or JVD.  Cardiovascular:     Rate and Rhythm: Normal rate and regular rhythm.     Heart sounds: Normal heart sounds. No murmur heard.   Pulmonary:     Effort: Pulmonary effort is normal.     Breath sounds: Normal breath sounds. No rales.  Skin:    General: Skin is warm and dry.  Neurological:     Mental Status: He is alert and oriented to person, place, and time.        Assessment & Plan:    Bradley Howe is a 54 y.o. male . Type 2 diabetes mellitus with hyperglycemia, with long-term current use of insulin (HCC) - Plan: lisinopril (ZESTRIL) 10 MG tablet, glipiZIDE (GLUCOTROL) 5 MG tablet, Lipid panel, CMP14+EGFR, Hemoglobin A1c  Special screening for malignant neoplasms, colon - Plan: Cologuard  Need for Tdap vaccination - Plan: Tdap vaccine greater than or equal to 7yo IM  Encounter for hepatitis C screening test for low risk patient - Plan: Hepatitis C antibody  Other diabetic neurological complication associated with type 2 diabetes mellitus ([place])  Hyperlipidemia, unspecified hyperlipidemia type - Plan: rosuvastatin (CRESTOR) 20 MG tablet, Lipid panel  Uncontrolled diabetes, no recent testing.    -Based on home readings will have him increase his 70/30 insulin to 52 units twice per day.  No change in glipizide for now.  May need to adjust regimen now that he is off Metformin and watch for hypoglycemia with use of insulin and glipizide.  -Check lipid panel, CMP, continue lisinopril, rosuvastatin for hypertension, hyperlipidemia.  Continue gabapentin for stable diabetic neuropathy.  1 month follow-up.  -Screening options with colonoscopy versus Cologuard discussed. Discussed timing of repeat testing intervals if normal, as well as potential need for diagnostic Colonoscopy if positive Cologuard. Understanding expressed, and chose Cologuard.   -Tdap updated, hep C screening for health maintenance.  At end of visit asked about chronic back pain.  Plan to discuss this further at his follow-up visit as this has been an ongoing issue without new symptoms.  Did recommend that he try Tylenol in place of NSAIDs given previous reported elevated creatinine.  RTC precautions, handout given  Meds ordered this encounter  Medications  . lisinopril (ZESTRIL) 10 MG tablet    Sig: Take 1 tablet (10 mg total) by mouth daily.    Dispense:  90 tablet    Refill:  1  . glipiZIDE (GLUCOTROL) 5 MG  tablet    Sig: Take 1 tablet (5 mg total) by mouth 2 (two) times daily.    Dispense:  90 tablet    Refill:  1  . rosuvastatin (CRESTOR) 20 MG tablet    Sig: Take 1 tablet (20 mg total) by mouth daily.    Dispense:  90 tablet    Refill:  1  . gabapentin (NEURONTIN) 100 MG capsule    Sig: Take 1 capsule (100 mg total) by mouth at bedtime.    Dispense:  90 capsule    Refill:  1  . insulin NPH-regular Human (70-30) 100 UNIT/ML injection    Sig: Inject 52 Units into the skin 2 (two) times daily with a meal.    Dispense:  10 mL    Refill:  6   Patient Instructions    Increase insulin to 52 units twice per day. No other changes until I see labs.  Restart lisinopril once per day.  Keep a record of your blood sugar readings outside of the office and bring them to the next office visit.  I will order the cologuard.   Stop ibuprofen, try tylenol, can discuss back pain further next visit.  Return to the clinic or go to the nearest emergency room if any of your symptoms worsen or new symptoms occur.   Chronic Back Pain When back pain lasts longer than 3 months, it is called chronic back pain.The cause of your back pain may not be known. Some common causes include:  Wear and tear (degenerative disease) of the bones, ligaments, or disks in your back.  Inflammation and stiffness in your back (arthritis). People who have chronic back pain often go through certain periods in which the pain is more intense (flare-ups). Many people can learn to manage the pain with home care. Follow these instructions at home: Pay attention to any changes in your symptoms. Take these actions to help with your pain: Activity   Avoid bending and other activities that make the problem worse.  Maintain a proper position when standing or sitting: ? When standing, keep your upper back and neck straight, with your shoulders pulled back. Avoid slouching. ? When sitting, keep your back straight and relax your  shoulders. Do not round your shoulders or pull them backward.  Do not sit or stand in one place for long periods of time.  Take brief periods of rest throughout the day. This will reduce your pain. Resting in a lying or standing position is usually better than sitting to rest.  When you are resting for longer periods, mix in some mild activity or stretching between periods of rest. This will help to prevent stiffness and pain.  Get regular exercise. Ask your health care provider what activities are safe for you.  Do not lift anything that is heavier than 10 lb (4.5 kg). Always use proper lifting technique, which includes: ? Bending your knees. ? Keeping the load close to your body. ? Avoiding twisting.  Sleep on a firm mattress in a comfortable position. Try lying on your side with your knees slightly bent. If you lie on your back, put a pillow under your knees. Managing pain  If directed, apply ice to the painful area. Your health care provider may recommend applying ice during the first 24-48 hours after a flare-up begins. ? Put ice in a plastic bag. ? Place a towel between your skin and the bag. ? Leave the ice  on for 20 minutes, 2-3 times per day.  If directed, apply heat to the affected area as often as told by your health care provider. Use the heat source that your health care provider recommends, such as a moist heat pack or a heating pad. ? Place a towel between your skin and the heat source. ? Leave the heat on for 20-30 minutes. ? Remove the heat if your skin turns bright red. This is especially important if you are unable to feel pain, heat, or cold. You may have a greater risk of getting burned.  Try soaking in a warm tub.  Take over-the-counter and prescription medicines only as told by your health care provider.  Keep all follow-up visits as told by your health care provider. This is important. Contact a health care provider if:  You have pain that is not relieved  with rest or medicine. Get help right away if:  You have weakness or numbness in one or both of your legs or feet.  You have trouble controlling your bladder or your bowels.  You have nausea or vomiting.  You have pain in your abdomen.  You have shortness of breath or you faint. This information is not intended to replace advice given to you by your health care provider. Make sure you discuss any questions you have with your health care provider. Document Revised: 08/22/2018 Document Reviewed: 11/08/2016 Elsevier Patient Education  [place].   Type 2 Diabetes Mellitus, Self Care, Adult Caring for yourself after you have been diagnosed with type 2 diabetes (type 2 diabetes mellitus) means keeping your blood sugar (glucose) under control with a balance of:  Nutrition.  Exercise.  Lifestyle changes.  Medicines or insulin, if necessary.  Support from your team of health care providers and others. The following information explains what you need to know to manage your diabetes at home. What are the risks? Having diabetes can put you at risk for other long-term (chronic) conditions, such as heart disease and kidney disease. Your health care provider may prescribe medicines to help prevent complications from diabetes. These medicines may include:  Aspirin.  Medicine to lower cholesterol.  Medicine to control blood pressure. How to monitor blood glucose   Check your blood glucose every day, as often as told by your health care provider.  Have your A1c (hemoglobin A1c) level checked two or more times a year, or as often as told by your health care provider. Your health care provider will set individualized treatment goals for you. Generally, the goal of treatment is to maintain the following blood glucose levels:  Before meals (preprandial): 80-130 mg/dL (4.4-7.2 mmol/L).  After meals (postprandial): below 180 mg/dL (10 mmol/L).  A1c level: less than 7%. How to  manage hyperglycemia and hypoglycemia Hyperglycemia symptoms Hyperglycemia, also called high blood glucose, occurs when blood glucose is too high. Make sure you know the early signs of hyperglycemia, such as:  Increased thirst.  Hunger.  Feeling very tired.  Needing to urinate more often than usual.  Blurry vision. Hypoglycemia symptoms Hypoglycemia, also called low blood glucose, occurswith a blood glucose level at or below 70 mg/dL (3.9 mmol/L). The risk for hypoglycemia increases during or after exercise, during sleep, during illness, and when skipping meals or not eating for a long time (fasting). It is important to know the symptoms of hypoglycemia and treat it right away. Always have a 15-gram rapid-acting carbohydrate snack with you to treat low blood glucose. Family members and close friends  should also know the symptoms and should understand how to treat hypoglycemia, in case you are not able to treat yourself. Symptoms may include:  Hunger.  Anxiety.  Sweating and feeling clammy.  Confusion.  Dizziness or feeling light-headed.  Sleepiness.  Nausea.  Increased heart rate.  Headache.  Blurry vision.  Irritability.  A change in coordination.  Tingling or numbness around the mouth, lips, or tongue.  Restless sleep.  Fainting.  Seizure. Treating hypoglycemia If you are alert and able to swallow safely, follow the 15:15 rule:  Take 15 grams of a rapid-acting carbohydrate. Talk with your health care provider about how much you should take.  Rapid-acting options include: ? Glucose pills (take 15 grams). ? 6-8 pieces of hard candy. ? 4-6 oz (120-150 mL) of fruit juice. ? 4-6 oz (120-150 mL) of regular (not diet) soda. ? 1 Tbsp (15 mL) honey or sugar.  Check your blood glucose 15 minutes after you take the carbohydrate.  If the repeat blood glucose level is still at or below 70 mg/dL (3.9 mmol/L), take 15 grams of a carbohydrate again.  If your blood  glucose level does not increase above 70 mg/dL (3.9 mmol/L) after 3 tries, seek emergency medical care.  After your blood glucose level returns to normal, eat a meal or a snack within 1 hour. Treating severe hypoglycemia Severe hypoglycemia is when your blood glucose level is at or below 54 mg/dL (3 mmol/L). Severe hypoglycemia is an emergency. Do not wait to see if the symptoms will go away. Get medical help right away. Call your local emergency services (911 in the [place] If you have severe hypoglycemia and you cannot eat or drink, you may need an injection of glucagon. A family member or close friend should learn how to check your blood glucose and how to give you a glucagon injection. Ask your health care provider if you need to have an emergency glucagon injection kit available. Severe hypoglycemia may need to be treated in a hospital. The treatment may include getting glucose through an IV. You may also need treatment for the cause of your hypoglycemia. Follow these instructions at home: Take diabetes medicines as told  If your health care provider prescribed insulin or diabetes medicines, take them every day.  Do not run out of insulin or other diabetes medicines that you take. Plan ahead so you always have these available.  If you use insulin, adjust your dosage based on how physically active you are and what foods you eat. Your health care provider will tell you how to adjust your dosage. Make healthy food choices  The things that you eat and drink affect your blood glucose and your insulin dosage. Making good choices helps to control your diabetes and prevent other health problems. A healthy meal plan includes eating lean proteins, complex carbohydrates, fresh fruits and vegetables, low-fat dairy products, and healthy fats. Make an appointment to see a diet and nutrition specialist (registered dietitian) to help you create an eating plan that is right for you. Make sure that  you:  Follow instructions from your health care provider about eating or drinking restrictions.  Drink enough fluid to keep your urine pale yellow.  Keep a record of the carbohydrates that you eat. Do this by reading food labels and learning the standard serving sizes of foods.  Follow your sick day plan whenever you cannot eat or drink as usual. Make this plan in advance with your health care provider.  Stay active Exercise  regularly, as told by your health care provider. This may include:  Stretching and doing strength exercises, such as yoga or weightlifting, 2 or more times a week.  Doing 150 minutes or more of moderate-intensity or vigorous-intensity exercise each week. This could be brisk walking, biking, or water aerobics. ? Spread out your activity over 3 or more days of the week. ? Do not go more than 2 days in a row without doing some kind of physical activity. When you start a new exercise or activity, work with your health care provider to adjust your insulin, medicines, or food intake as needed. Make healthy lifestyle choices  Do not use any tobacco products, such as cigarettes, chewing tobacco, and e-cigarettes. If you need help quitting, ask your health care provider.  If your health care provider says that alcohol is safe for you, limit alcohol intake to no more than 1 drink per day for nonpregnant women and 2 drinks per day for men. One drink equals 12 oz of beer (355 mL), 5 oz of wine (148 mL), or 1 oz of hard liquor (44 mL).  Learn to manage stress. If you need help with this, ask your health care provider. Care for your body   Keep your immunizations up to date. In addition to getting vaccinations as told by your health care provider, it is recommended that you get vaccinated against the following illnesses: ? The flu (influenza). Get a flu shot every year. ? Pneumonia. ? Hepatitis B.  Schedule an eye exam soon after your diagnosis, and then one time every year  after that.  Check your skin and feet every day for cuts, bruises, redness, blisters, or sores. Schedule a foot exam with your health care provider once every year.  Brush your teeth and gums two times a day, and floss one or more times a day. Visit your dentist one or more times every 6 months.  Maintain a healthy weight. General instructions  Take over-the-counter and prescription medicines only as told by your health care provider.  Share your diabetes management plan with people in your workplace, school, and household.  Carry a medical alert card or wear medical alert jewelry.  Keep all follow-up visits as told by your health care provider. This is important. Questions to ask your health care provider  Do I need to meet with a diabetes educator?  Where can I find a support group for people with diabetes? Where to find more information For more information about diabetes, visit:  American Diabetes Association (ADA): www.diabetes.org  American Association of Diabetes Educators (AADE): www.diabeteseducator.org Summary  Caring for yourself after you have been diagnosed with (type 2 diabetes mellitus) means keeping your blood sugar (glucose) under control with a balance of nutrition, exercise, lifestyle changes, and medicine.  Check your blood glucose every day, as often as told by your health care provider.  Having diabetes can put you at risk for other long-term (chronic) conditions, such as heart disease and kidney disease. Your health care provider may prescribe medicines to help prevent complications from diabetes.  Keep all follow-up visits as told by your health care provider. This is important. This information is not intended to replace advice given to you by your health care provider. Make sure you discuss any questions you have with your health care provider. Document Revised: 10/22/2017 Document Reviewed: 06/04/2015 Elsevier Patient Education  [employer].   If you have lab work done today you will be contacted with your lab  results within the next 2 weeks.  If you have not heard from [place] then please contact us. The fastest way to get your results is to register for My Chart.   IF you received an x-ray today, you will receive an invoice from [facility]. Please contact [facility] at [phone] with questions or concerns regarding your invoice.   IF you received labwork today, you will receive an invoice from [place]. Please contact [facility] at [phone] with questions or concerns regarding your invoice.   Our billing staff will not be able to assist you with questions regarding bills from these companies.  You will be contacted with the lab results as soon as they are available. The fastest way to get your results is to activate your My Chart account. Instructions are located on the last page of this paperwork. If you have not heard from [place] regarding the results in 2 weeks, please contact this office.          Signed, Merri Ray, MD [facility]

## 2019-12-19 NOTE — Patient Instructions (Addendum)
Increase insulin to 52 units twice per day. No other changes until I see labs.  Restart lisinopril once per day.  Keep a record of your blood sugar readings outside of the office and bring them to the next office visit.  I will order the cologuard.   Stop ibuprofen, try tylenol, can discuss back pain further next visit.  Return to the clinic or go to the nearest emergency room if any of your symptoms worsen or new symptoms occur.   Chronic Back Pain When back pain lasts longer than 3 months, it is called chronic back pain.The cause of your back pain may not be known. Some common causes include:  Wear and tear (degenerative disease) of the bones, ligaments, or disks in your back.  Inflammation and stiffness in your back (arthritis). People who have chronic back pain often go through certain periods in which the pain is more intense (flare-ups). Many people can learn to manage the pain with home care. Follow these instructions at home: Pay attention to any changes in your symptoms. Take these actions to help with your pain: Activity   Avoid bending and other activities that make the problem worse.  Maintain a proper position when standing or sitting: ? When standing, keep your upper back and neck straight, with your shoulders pulled back. Avoid slouching. ? When sitting, keep your back straight and relax your shoulders. Do not round your shoulders or pull them backward.  Do not sit or stand in one place for long periods of time.  Take brief periods of rest throughout the day. This will reduce your pain. Resting in a lying or standing position is usually better than sitting to rest.  When you are resting for longer periods, mix in some mild activity or stretching between periods of rest. This will help to prevent stiffness and pain.  Get regular exercise. Ask your health care provider what activities are safe for you.  Do not lift anything that is heavier than 10 lb (4.5 kg).  Always use proper lifting technique, which includes: ? Bending your knees. ? Keeping the load close to your body. ? Avoiding twisting.  Sleep on a firm mattress in a comfortable position. Try lying on your side with your knees slightly bent. If you lie on your back, put a pillow under your knees. Managing pain  If directed, apply ice to the painful area. Your health care provider may recommend applying ice during the first 24-48 hours after a flare-up begins. ? Put ice in a plastic bag. ? Place a towel between your skin and the bag. ? Leave the ice on for 20 minutes, 2-3 times per day.  If directed, apply heat to the affected area as often as told by your health care provider. Use the heat source that your health care provider recommends, such as a moist heat pack or a heating pad. ? Place a towel between your skin and the heat source. ? Leave the heat on for 20-30 minutes. ? Remove the heat if your skin turns bright red. This is especially important if you are unable to feel pain, heat, or cold. You may have a greater risk of getting burned.  Try soaking in a warm tub.  Take over-the-counter and prescription medicines only as told by your health care provider.  Keep all follow-up visits as told by your health care provider. This is important. Contact a health care provider if:  You have pain that is not relieved with rest or  medicine. Get help right away if:  You have weakness or numbness in one or both of your legs or feet.  You have trouble controlling your bladder or your bowels.  You have nausea or vomiting.  You have pain in your abdomen.  You have shortness of breath or you faint. This information is not intended to replace advice given to you by your health care provider. Make sure you discuss any questions you have with your health care provider. Document Revised: 08/22/2018 Document Reviewed: 11/08/2016 Elsevier Patient Education  [place].   Type 2  Diabetes Mellitus, Self Care, Adult Caring for yourself after you have been diagnosed with type 2 diabetes (type 2 diabetes mellitus) means keeping your blood sugar (glucose) under control with a balance of:  Nutrition.  Exercise.  Lifestyle changes.  Medicines or insulin, if necessary.  Support from your team of health care providers and others. The following information explains what you need to know to manage your diabetes at home. What are the risks? Having diabetes can put you at risk for other long-term (chronic) conditions, such as heart disease and kidney disease. Your health care provider may prescribe medicines to help prevent complications from diabetes. These medicines may include:  Aspirin.  Medicine to lower cholesterol.  Medicine to control blood pressure. How to monitor blood glucose   Check your blood glucose every day, as often as told by your health care provider.  Have your A1c (hemoglobin A1c) level checked two or more times a year, or as often as told by your health care provider. Your health care provider will set individualized treatment goals for you. Generally, the goal of treatment is to maintain the following blood glucose levels:  Before meals (preprandial): 80-130 mg/dL (4.4-7.2 mmol/L).  After meals (postprandial): below 180 mg/dL (10 mmol/L).  A1c level: less than 7%. How to manage hyperglycemia and hypoglycemia Hyperglycemia symptoms Hyperglycemia, also called high blood glucose, occurs when blood glucose is too high. Make sure you know the early signs of hyperglycemia, such as:  Increased thirst.  Hunger.  Feeling very tired.  Needing to urinate more often than usual.  Blurry vision. Hypoglycemia symptoms Hypoglycemia, also called low blood glucose, occurswith a blood glucose level at or below 70 mg/dL (3.9 mmol/L). The risk for hypoglycemia increases during or after exercise, during sleep, during illness, and when skipping meals or  not eating for a long time (fasting). It is important to know the symptoms of hypoglycemia and treat it right away. Always have a 15-gram rapid-acting carbohydrate snack with you to treat low blood glucose. Family members and close friends should also know the symptoms and should understand how to treat hypoglycemia, in case you are not able to treat yourself. Symptoms may include:  Hunger.  Anxiety.  Sweating and feeling clammy.  Confusion.  Dizziness or feeling light-headed.  Sleepiness.  Nausea.  Increased heart rate.  Headache.  Blurry vision.  Irritability.  A change in coordination.  Tingling or numbness around the mouth, lips, or tongue.  Restless sleep.  Fainting.  Seizure. Treating hypoglycemia If you are alert and able to swallow safely, follow the 15:15 rule:  Take 15 grams of a rapid-acting carbohydrate. Talk with your health care provider about how much you should take.  Rapid-acting options include: ? Glucose pills (take 15 grams). ? 6-8 pieces of hard candy. ? 4-6 oz (120-150 mL) of fruit juice. ? 4-6 oz (120-150 mL) of regular (not diet) soda. ? 1 Tbsp (15 mL)  honey or sugar.  Check your blood glucose 15 minutes after you take the carbohydrate.  If the repeat blood glucose level is still at or below 70 mg/dL (3.9 mmol/L), take 15 grams of a carbohydrate again.  If your blood glucose level does not increase above 70 mg/dL (3.9 mmol/L) after 3 tries, seek emergency medical care.  After your blood glucose level returns to normal, eat a meal or a snack within 1 hour. Treating severe hypoglycemia Severe hypoglycemia is when your blood glucose level is at or below 54 mg/dL (3 mmol/L). Severe hypoglycemia is an emergency. Do not wait to see if the symptoms will go away. Get medical help right away. Call your local emergency services (911 in the [place] If you have severe hypoglycemia and you cannot eat or drink, you may need an injection of glucagon. A  family member or close friend should learn how to check your blood glucose and how to give you a glucagon injection. Ask your health care provider if you need to have an emergency glucagon injection kit available. Severe hypoglycemia may need to be treated in a hospital. The treatment may include getting glucose through an IV. You may also need treatment for the cause of your hypoglycemia. Follow these instructions at home: Take diabetes medicines as told  If your health care provider prescribed insulin or diabetes medicines, take them every day.  Do not run out of insulin or other diabetes medicines that you take. Plan ahead so you always have these available.  If you use insulin, adjust your dosage based on how physically active you are and what foods you eat. Your health care provider will tell you how to adjust your dosage. Make healthy food choices  The things that you eat and drink affect your blood glucose and your insulin dosage. Making good choices helps to control your diabetes and prevent other health problems. A healthy meal plan includes eating lean proteins, complex carbohydrates, fresh fruits and vegetables, low-fat dairy products, and healthy fats. Make an appointment to see a diet and nutrition specialist (registered dietitian) to help you create an eating plan that is right for you. Make sure that you:  Follow instructions from your health care provider about eating or drinking restrictions.  Drink enough fluid to keep your urine pale yellow.  Keep a record of the carbohydrates that you eat. Do this by reading food labels and learning the standard serving sizes of foods.  Follow your sick day plan whenever you cannot eat or drink as usual. Make this plan in advance with your health care provider.  Stay active Exercise regularly, as told by your health care provider. This may include:  Stretching and doing strength exercises, such as yoga or weightlifting, 2 or more times a  week.  Doing 150 minutes or more of moderate-intensity or vigorous-intensity exercise each week. This could be brisk walking, biking, or water aerobics. ? Spread out your activity over 3 or more days of the week. ? Do not go more than 2 days in a row without doing some kind of physical activity. When you start a new exercise or activity, work with your health care provider to adjust your insulin, medicines, or food intake as needed. Make healthy lifestyle choices  Do not use any tobacco products, such as cigarettes, chewing tobacco, and e-cigarettes. If you need help quitting, ask your health care provider.  If your health care provider says that alcohol is safe for you, limit alcohol intake to no  more than 1 drink per day for nonpregnant women and 2 drinks per day for men. One drink equals 12 oz of beer (355 mL), 5 oz of wine (148 mL), or 1 oz of hard liquor (44 mL).  Learn to manage stress. If you need help with this, ask your health care provider. Care for your body   Keep your immunizations up to date. In addition to getting vaccinations as told by your health care provider, it is recommended that you get vaccinated against the following illnesses: ? The flu (influenza). Get a flu shot every year. ? Pneumonia. ? Hepatitis B.  Schedule an eye exam soon after your diagnosis, and then one time every year after that.  Check your skin and feet every day for cuts, bruises, redness, blisters, or sores. Schedule a foot exam with your health care provider once every year.  Brush your teeth and gums two times a day, and floss one or more times a day. Visit your dentist one or more times every 6 months.  Maintain a healthy weight. General instructions  Take over-the-counter and prescription medicines only as told by your health care provider.  Share your diabetes management plan with people in your workplace, school, and household.  Carry a medical alert card or wear medical alert  jewelry.  Keep all follow-up visits as told by your health care provider. This is important. Questions to ask your health care provider  Do I need to meet with a diabetes educator?  Where can I find a support group for people with diabetes? Where to find more information For more information about diabetes, visit:  American Diabetes Association (ADA): www.diabetes.org  American Association of Diabetes Educators (AADE): www.diabeteseducator.org Summary  Caring for yourself after you have been diagnosed with (type 2 diabetes mellitus) means keeping your blood sugar (glucose) under control with a balance of nutrition, exercise, lifestyle changes, and medicine.  Check your blood glucose every day, as often as told by your health care provider.  Having diabetes can put you at risk for other long-term (chronic) conditions, such as heart disease and kidney disease. Your health care provider may prescribe medicines to help prevent complications from diabetes.  Keep all follow-up visits as told by your health care provider. This is important. This information is not intended to replace advice given to you by your health care provider. Make sure you discuss any questions you have with your health care provider. Document Revised: 10/22/2017 Document Reviewed: 06/04/2015 Elsevier Patient Education  [employer].   If you have lab work done today you will be contacted with your lab results within the next 2 weeks.  If you have not heard from [place] then please contact us. The fastest way to get your results is to register for My Chart.   IF you received an x-ray today, you will receive an invoice from [facility]. Please contact [facility] at [phone] with questions or concerns regarding your invoice.   IF you received labwork today, you will receive an invoice from [facility]. Please contact [facility] at [phone] with questions or concerns regarding your invoice.    Our billing staff will not be able to assist you with questions regarding bills from these companies.  You will be contacted with the lab results as soon as they are available. The fastest way to get your results is to activate your My Chart account. Instructions are located on the last page of this paperwork. If you have not heard from [place]  regarding the results in 2 weeks, please contact this office.

## 2019-12-20 LAB — CMP14+EGFR
ALT: 46 IU/L — ABNORMAL HIGH (ref 0–44)
AST: 63 IU/L — ABNORMAL HIGH (ref 0–40)
Albumin/Globulin Ratio: 1.8 (ref 1.2–2.2)
Albumin: 4.5 g/dL (ref 3.8–4.9)
Alkaline Phosphatase: 80 IU/L (ref 48–121)
BUN/Creatinine Ratio: 7 — ABNORMAL LOW (ref 9–20)
BUN: 7 mg/dL (ref 6–24)
Bilirubin Total: 0.5 mg/dL (ref 0.0–1.2)
CO2: 24 mmol/L (ref 20–29)
Calcium: 9.1 mg/dL (ref 8.7–10.2)
Chloride: 102 mmol/L (ref 96–106)
Creatinine, Ser: 1.01 mg/dL (ref 0.76–1.27)
GFR calc Af Amer: 96 mL/min/{1.73_m2} (ref >59)
GFR calc non Af Amer: 83 mL/min/{1.73_m2} (ref >59)
Globulin, Total: 2.5 g/dL (ref 1.5–4.5)
Glucose: 158 mg/dL — ABNORMAL HIGH (ref 65–99)
Potassium: 4.1 mmol/L (ref 3.5–5.2)
Sodium: 138 mmol/L (ref 134–144)
Total Protein: 7 g/dL (ref 6.0–8.5)

## 2019-12-20 LAB — LIPID PANEL
Chol/HDL Ratio: 5.1 ratio — ABNORMAL HIGH (ref 0.0–5.0)
Cholesterol, Total: 163 mg/dL (ref 100–199)
HDL: 32 mg/dL — ABNORMAL LOW (ref >39)
LDL Chol Calc (NIH): 80 mg/dL (ref 0–99)
Triglycerides: 311 mg/dL — ABNORMAL HIGH (ref 0–149)
VLDL Cholesterol Cal: 51 mg/dL — ABNORMAL HIGH (ref 5–40)

## 2019-12-20 LAB — HEPATITIS C ANTIBODY: Hep C Virus Ab: <0.1 s/co ratio (ref 0.0–0.9)

## 2019-12-20 LAB — HEMOGLOBIN A1C
Est. average glucose Bld gHb Est-mCnc: 226 mg/dL
Hgb A1c MFr Bld: 9.5 % — ABNORMAL HIGH (ref 4.8–5.6)

## 2020-01-09 ENCOUNTER — Other Ambulatory Visit: Payer: Self-pay

## 2020-01-09 ENCOUNTER — Encounter: Payer: Self-pay | Admitting: Family Medicine

## 2020-01-09 ENCOUNTER — Telehealth (INDEPENDENT_AMBULATORY_CARE_PROVIDER_SITE_OTHER): Payer: Commercial Managed Care - PPO | Admitting: Family Medicine

## 2020-01-09 VITALS — Ht 67.0 in | Wt 225.0 lb

## 2020-01-09 DIAGNOSIS — R7989 Other specified abnormal findings of blood chemistry: Secondary | ICD-10-CM | POA: Diagnosis not present

## 2020-01-09 DIAGNOSIS — E1149 Type 2 diabetes mellitus with other diabetic neurological complication: Secondary | ICD-10-CM | POA: Diagnosis not present

## 2020-01-09 DIAGNOSIS — Z794 Long term (current) use of insulin: Secondary | ICD-10-CM

## 2020-01-09 DIAGNOSIS — E1165 Type 2 diabetes mellitus with hyperglycemia: Secondary | ICD-10-CM | POA: Diagnosis not present

## 2020-01-09 MED ORDER — FREESTYLE LIBRE SENSOR SYSTEM MISC: 0 refills | Status: DC

## 2020-01-09 NOTE — Progress Notes (Signed)
Virtual Visit via Video Note  I connected with Bradley Howe on 01/09/20 at 4:39 PM by a video enabled telemedicine application and verified that I am speaking with the correct person using two identifiers.  Patient location:home  My location: office    I discussed the limitations, risks, security and privacy concerns of performing an evaluation and management service by telephone and the availability of in person appointments. I also discussed with the patient that there may be a patient responsible charge related to this service. The patient expressed understanding and agreed to proceed, consent obtained  Chief complaint:  Chief Complaint  Patient presents with  . Follow-up    on diabetes. pt had blood drawen on 12/19/2019. pt reports no issues with this condition since last OV. pt would like to go over lab results.     History of Present Illness: Bradley Howe is a 54 y.o. male   Diabetes: Complicated by hyperglycemia, neuropathy. Last visit with me August 6, had not been seen since February.  Had stopped Metformin 6 months prior due to GI intolerance, taking glipizide 5 mg twice daily and 50 units twice daily of 70/30 insulin.  Gabapentin 100 mg nightly that was helping his neuropathy.  Fastings 05/21/1988 230, postprandials up to 315.  No symptomatic lows.  He is on statin and ACE inhibitor Initially increased to 52 units twice daily of 70/30 insulin. Since last visit: Fasting: 179-225 2 hour postprandial: 250-260, no 300's.  Lowest reading 179. No symptomatic lows Gabapentin still working well on current dose.     Lab Results  Component Value Date   HGBA1C 9.5 (H) 12/19/2019   HGBA1C 11.2 (H) 09/17/2015   HGBA1C =>14% 01/15/2013   Lab Results  Component Value Date   LDLCALC 80 12/19/2019   CREATININE 1.01 12/19/2019     Results for orders placed or performed in visit on 12/19/19  Lipid panel  Result Value Ref Range   Cholesterol, Total 163 100 - 199 mg/dL    Triglycerides 311 (H) 0 - 149 mg/dL   HDL 32 (L) >39 mg/dL   VLDL Cholesterol Cal 51 (H) 5 - 40 mg/dL   LDL Chol Calc (NIH) 80 0 - 99 mg/dL   Chol/HDL Ratio 5.1 (H) 0.0 - 5.0 ratio  CMP14+EGFR  Result Value Ref Range   Glucose 158 (H) 65 - 99 mg/dL   BUN 7 6 - 24 mg/dL   Creatinine, Ser 1.01 0.76 - 1.27 mg/dL   GFR calc non Af Amer 83 >59 mL/min/1.73   GFR calc Af Amer 96 >59 mL/min/1.73   BUN/Creatinine Ratio 7 (L) 9 - 20   Sodium 138 134 - 144 mmol/L   Potassium 4.1 3.5 - 5.2 mmol/L   Chloride 102 96 - 106 mmol/L   CO2 24 20 - 29 mmol/L   Calcium 9.1 8.7 - 10.2 mg/dL   Total Protein 7.0 6.0 - 8.5 g/dL   Albumin 4.5 3.8 - 4.9 g/dL   Globulin, Total 2.5 1.5 - 4.5 g/dL   Albumin/Globulin Ratio 1.8 1.2 - 2.2   Bilirubin Total 0.5 0.0 - 1.2 mg/dL   Alkaline Phosphatase 80 48 - 121 IU/L   AST 63 (H) 0 - 40 IU/L   ALT 46 (H) 0 - 44 IU/L  Hemoglobin A1c  Result Value Ref Range   Hgb A1c MFr Bld 9.5 (H) 4.8 - 5.6 %   Est. average glucose Bld gHb Est-mCnc 226 mg/dL  Hepatitis C antibody  Result Value Ref Range  Hep C Virus Ab <0.1 0.0 - 0.9 s/co ratio   Elevated LFT's: 1-2 few days per week, up to 6 on weekends.  No abd pain,n/v/jaundice or scleral icterus.    Patient Active Problem List   Diagnosis Date Noted  . Obesity, unspecified 08/20/2019  . Ex-smoker 08/20/2019  . GERD (gastroesophageal reflux disease) 08/20/2019  . Elevated LFTs 08/20/2019  . Angina pectoris ([place])   . Chest pain 09/17/2015  . Type 2 diabetes mellitus without complications ([place]) 72/53/6644  . Hypercholesterolemia 01/15/2013  . Renal insufficiency 07/26/2012  . Hyponatremia 07/26/2012  . Essential (primary) hypertension 07/26/2012  . Left inguinal hernia 05/19/2011   Past Medical History:  Diagnosis Date  . GERD (gastroesophageal reflux disease)   . High cholesterol   . Hypertension   . Type II diabetes mellitus ([place])    Past Surgical History:  Procedure Laterality Date  . INGUINAL HERNIA  REPAIR Left 2013   No Known Allergies Prior to Admission medications   Medication Sig Start Date End Date Taking? Authorizing Provider  gabapentin (NEURONTIN) 100 MG capsule Take 1 capsule (100 mg total) by mouth at bedtime. 12/19/19  Yes Wendie Agreste, MD  glipiZIDE (GLUCOTROL) 5 MG tablet Take 1 tablet (5 mg total) by mouth 2 (two) times daily. 12/19/19  Yes Wendie Agreste, MD  insulin NPH-regular Human (70-30) 100 UNIT/ML injection Inject 52 Units into the skin 2 (two) times daily with a meal. 12/19/19  Yes Wendie Agreste, MD  lisinopril (ZESTRIL) 10 MG tablet Take 1 tablet (10 mg total) by mouth daily. 12/19/19  Yes Wendie Agreste, MD  omeprazole (PRILOSEC) 20 MG capsule Take by mouth. 05/12/11  Yes [provider]  rosuvastatin (CRESTOR) 20 MG tablet Take 1 tablet (20 mg total) by mouth daily. 12/19/19  Yes Wendie Agreste, MD   Social History   Socioeconomic History  . Marital status: Married    Spouse name: Not on file  . Number of children: Not on file  . Years of education: Not on file  . Highest education level: Not on file  Occupational History  . Not on file  Tobacco Use  . Smoking status: Former Smoker    Packs/day: 0.10    Years: 25.00    Pack years: 2.50    Types: Cigarettes  . Smokeless tobacco: Never Used  . Tobacco comment: "quit smoking cigarettes in 2013"  Substance and Sexual Activity  . Alcohol use: Yes    Comment: occ  . Drug use: No  . Sexual activity: Yes  Other Topics Concern  . Not on file  Social History Narrative  . Not on file   Social Determinants of Health   Financial Resource Strain:   . Difficulty of Paying Living Expenses: Not on file  Food Insecurity:   . Worried About Charity fundraiser in the Last Year: Not on file  . Ran Out of Food in the Last Year: Not on file  Transportation Needs:   . Lack of Transportation (Medical): Not on file  . Lack of Transportation (Non-Medical): Not on file  Physical Activity:   .  Days of Exercise per Week: Not on file  . Minutes of Exercise per Session: Not on file  Stress:   . Feeling of Stress : Not on file  Social Connections:   . Frequency of Communication with Friends and Family: Not on file  . Frequency of Social Gatherings with Friends and Family: Not on file  . Attends  Religious Services: Not on file  . Active Member of Clubs or Organizations: Not on file  . Attends Archivist Meetings: Not on file  . Marital Status: Not on file  Intimate Partner Violence:   . Fear of Current or Ex-Partner: Not on file  . Emotionally Abused: Not on file  . Physically Abused: Not on file  . Sexually Abused: Not on file    Observations/Objective: Vitals:   01/09/20 1321  Weight: 225 lb (102.1 kg)  Height: _0  (1.702 m)   Nontoxic appearance on video, appropriate responses, all questions were answered.  Assessment and Plan: Type 2 diabetes mellitus with hyperglycemia, with long-term current use of insulin ([place]) - Plan: Ambulatory referral to Endocrinology, Continuous Blood Gluc Sensor ([place]) MISC  -Still uncontrolled, will refer to endocrinology, for now we will increase 70/30 insulin to 54 units twice per day with hypoglycemic precautions discussed, especially with glipizide.  Anticipate regimen will be adjusted, but will refer to endocrinology.  Continuous meter ordered.  Other diabetic neurological complication associated with type 2 diabetes mellitus ([place])  -Stable with gabapentin.  Elevated LFTs - Plan: Comprehensive metabolic panel  -Decreased alcohol recommended.  Repeat LFTs next 3 weeks.  Asymptomatic at this time.  Consider ultrasound if persistent elevation  Follow Up Instructions: 3 weeks lab visit.    I discussed the assessment and treatment plan with the patient. The patient was provided an opportunity to ask questions and all were answered. The patient agreed with the plan and demonstrated an understanding of the  instructions.   The patient was advised to call back or seek an in-person evaluation if the symptoms worsen or if the condition fails to improve as anticipated.  I provided 20 minutes of non-face-to-face time during this encounter.   Wendie Agreste, MD

## 2020-01-09 NOTE — Progress Notes (Deleted)
Subjective:  Patient ID: Bradley Howe, male    DOB: [birth year]  Age: 54 y.o. MRN: [id number]  CC:  Chief Complaint  Patient presents with  . Follow-up    on diabetes. pt had blood drawen on 12/19/2019. pt reports no issues with this condition since last OV. pt would like to go over lab results.    HPI Bradley Howe presents for   Diabetes: Complicated by hyperglycemia, neuropathy. Last visit with me August 6, had not been seen since February.  Had stopped Metformin 6 months prior, taking glipizide 5 mg twice daily and 50 units twice daily of 70/30 insulin.  Gabapentin 100 mg nightly that was helping his neuropathy.  Fastings 05/21/1988 230, postprandials up to 315.  No symptomatic lows.  He is on statin and ACE inhibitor Initially increased to 52 units twice daily of 70/30 insulin.  Lab Results  Component Value Date   HGBA1C 9.5 (H) 12/19/2019   HGBA1C 11.2 (H) 09/17/2015   HGBA1C =>14% 01/15/2013   Lab Results  Component Value Date   LDLCALC 80 12/19/2019   CREATININE 1.01 12/19/2019   Results for orders placed or performed in visit on 12/19/19  Lipid panel  Result Value Ref Range   Cholesterol, Total 163 100 - 199 mg/dL   Triglycerides 311 (H) 0 - 149 mg/dL   HDL 32 (L) >39 mg/dL   VLDL Cholesterol Cal 51 (H) 5 - 40 mg/dL   LDL Chol Calc (NIH) 80 0 - 99 mg/dL   Chol/HDL Ratio 5.1 (H) 0.0 - 5.0 ratio  CMP14+EGFR  Result Value Ref Range   Glucose 158 (H) 65 - 99 mg/dL   BUN 7 6 - 24 mg/dL   Creatinine, Ser 1.01 0.76 - 1.27 mg/dL   GFR calc non Af Amer 83 >59 mL/min/1.73   GFR calc Af Amer 96 >59 mL/min/1.73   BUN/Creatinine Ratio 7 (L) 9 - 20   Sodium 138 134 - 144 mmol/L   Potassium 4.1 3.5 - 5.2 mmol/L   Chloride 102 96 - 106 mmol/L   CO2 24 20 - 29 mmol/L   Calcium 9.1 8.7 - 10.2 mg/dL   Total Protein 7.0 6.0 - 8.5 g/dL   Albumin 4.5 3.8 - 4.9 g/dL   Globulin, Total 2.5 1.5 - 4.5 g/dL   Albumin/Globulin Ratio 1.8 1.2 - 2.2   Bilirubin Total 0.5 0.0 - 1.2 mg/dL     Alkaline Phosphatase 80 48 - 121 IU/L   AST 63 (H) 0 - 40 IU/L   ALT 46 (H) 0 - 44 IU/L  Hemoglobin A1c  Result Value Ref Range   Hgb A1c MFr Bld 9.5 (H) 4.8 - 5.6 %   Est. average glucose Bld gHb Est-mCnc 226 mg/dL  Hepatitis C antibody  Result Value Ref Range   Hep C Virus Ab <0.1 0.0 - 0.9 s/co ratio      History Patient Active Problem List   Diagnosis Date Noted  . Obesity, unspecified 08/20/2019  . Ex-smoker 08/20/2019  . GERD (gastroesophageal reflux disease) 08/20/2019  . Elevated LFTs 08/20/2019  . Angina pectoris ([place])   . Chest pain 09/17/2015  . Type 2 diabetes mellitus without complications ([place]) 93/81/0175  . Hypercholesterolemia 01/15/2013  . Renal insufficiency 07/26/2012  . Hyponatremia 07/26/2012  . Essential (primary) hypertension 07/26/2012  . Left inguinal hernia 05/19/2011   Past Medical History:  Diagnosis Date  . GERD (gastroesophageal reflux disease)   . High cholesterol   . Hypertension   . Type  II diabetes mellitus ([place])    Past Surgical History:  Procedure Laterality Date  . INGUINAL HERNIA REPAIR Left 2013   No Known Allergies Prior to Admission medications   Medication Sig Start Date End Date Taking? Authorizing Provider  gabapentin (NEURONTIN) 100 MG capsule Take 1 capsule (100 mg total) by mouth at bedtime. 12/19/19  Yes Wendie Agreste, MD  glipiZIDE (GLUCOTROL) 5 MG tablet Take 1 tablet (5 mg total) by mouth 2 (two) times daily. 12/19/19  Yes Wendie Agreste, MD  insulin NPH-regular Human (70-30) 100 UNIT/ML injection Inject 52 Units into the skin 2 (two) times daily with a meal. 12/19/19  Yes Wendie Agreste, MD  lisinopril (ZESTRIL) 10 MG tablet Take 1 tablet (10 mg total) by mouth daily. 12/19/19  Yes Wendie Agreste, MD  omeprazole (PRILOSEC) 20 MG capsule Take by mouth. 05/12/11  Yes [provider]  rosuvastatin (CRESTOR) 20 MG tablet Take 1 tablet (20 mg total) by mouth daily. 12/19/19  Yes Wendie Agreste, MD    Social History   Socioeconomic History  . Marital status: Married    Spouse name: Not on file  . Number of children: Not on file  . Years of education: Not on file  . Highest education level: Not on file  Occupational History  . Not on file  Tobacco Use  . Smoking status: Former Smoker    Packs/day: 0.10    Years: 25.00    Pack years: 2.50    Types: Cigarettes  . Smokeless tobacco: Never Used  . Tobacco comment: "quit smoking cigarettes in 2013"  Substance and Sexual Activity  . Alcohol use: Yes    Comment: occ  . Drug use: No  . Sexual activity: Yes  Other Topics Concern  . Not on file  Social History Narrative  . Not on file   Social Determinants of Health   Financial Resource Strain:   . Difficulty of Paying Living Expenses: Not on file  Food Insecurity:   . Worried About Charity fundraiser in the Last Year: Not on file  . Ran Out of Food in the Last Year: Not on file  Transportation Needs:   . Lack of Transportation (Medical): Not on file  . Lack of Transportation (Non-Medical): Not on file  Physical Activity:   . Days of Exercise per Week: Not on file  . Minutes of Exercise per Session: Not on file  Stress:   . Feeling of Stress : Not on file  Social Connections:   . Frequency of Communication with Friends and Family: Not on file  . Frequency of Social Gatherings with Friends and Family: Not on file  . Attends Religious Services: Not on file  . Active Member of Clubs or Organizations: Not on file  . Attends Archivist Meetings: Not on file  . Marital Status: Not on file  Intimate Partner Violence:   . Fear of Current or Ex-Partner: Not on file  . Emotionally Abused: Not on file  . Physically Abused: Not on file  . Sexually Abused: Not on file    Review of Systems   Objective:   Vitals:   01/09/20 1321  Weight: 225 lb (102.1 kg)  Height: 5' 7"  (1.702 m)     Physical Exam     Assessment & Plan:  Bradley Howe is a 54 y.o.  male . No diagnosis found.   No orders of the defined types were placed in this encounter.  There  are no Patient Instructions on file for this visit.    Signed, Merri Ray, MD [facility]

## 2020-01-09 NOTE — Patient Instructions (Signed)
I will refer you to endocrinology.  Increase insulin to 54 units twice per day for now but watch for low blood sugar readings.  Recheck in 3 weeks for a lab only visit for your liver test but decrease alcohol as we discussed for now.  Let me know if there are questions.

## 2020-01-13 ENCOUNTER — Telehealth: Payer: Self-pay | Admitting: Family Medicine

## 2020-01-13 NOTE — Telephone Encounter (Signed)
Pt has a prescription for  [place] needs clarification on 10 day or 14 day and need more specific  diirections needed  Please reach out to [place], [address]  [place], [place] [address]  Phone:  [phone] Fax:  [phone]

## 2020-01-15 ENCOUNTER — Other Ambulatory Visit: Payer: Self-pay

## 2020-01-15 DIAGNOSIS — Z794 Long term (current) use of insulin: Secondary | ICD-10-CM

## 2020-01-15 MED ORDER — FREESTYLE LIBRE SENSOR SYSTEM MISC: 0 refills | Status: DC

## 2020-01-15 NOTE — Telephone Encounter (Signed)
Needed a new Rx showing this as 14 day supply. Completed.

## 2020-01-20 ENCOUNTER — Other Ambulatory Visit: Payer: Self-pay

## 2020-01-20 DIAGNOSIS — E1165 Type 2 diabetes mellitus with hyperglycemia: Secondary | ICD-10-CM

## 2020-01-20 MED ORDER — FREESTYLE LIBRE SENSOR SYSTEM MISC: 1 refills | Status: DC

## 2020-01-20 NOTE — Telephone Encounter (Signed)
[facility] need to know the frequency also

## 2020-01-23 ENCOUNTER — Other Ambulatory Visit: Payer: Self-pay | Admitting: Family Medicine

## 2020-01-23 ENCOUNTER — Encounter: Payer: Self-pay | Admitting: Family Medicine

## 2020-01-23 DIAGNOSIS — R195 Other fecal abnormalities: Secondary | ICD-10-CM

## 2020-01-23 LAB — COLOGUARD: Cologuard: POSITIVE — AB

## 2020-01-25 NOTE — Telephone Encounter (Signed)
See lab notes

## 2020-01-28 ENCOUNTER — Encounter: Payer: Self-pay | Admitting: Nurse Practitioner

## 2020-01-30 ENCOUNTER — Other Ambulatory Visit: Payer: Self-pay

## 2020-01-30 ENCOUNTER — Ambulatory Visit: Payer: Commercial Managed Care - PPO

## 2020-01-30 DIAGNOSIS — R7989 Other specified abnormal findings of blood chemistry: Secondary | ICD-10-CM

## 2020-01-31 LAB — COMPREHENSIVE METABOLIC PANEL
ALT: 38 IU/L (ref 0–44)
AST: 45 IU/L — ABNORMAL HIGH (ref 0–40)
Albumin/Globulin Ratio: 2 (ref 1.2–2.2)
Albumin: 4.4 g/dL (ref 3.8–4.9)
Alkaline Phosphatase: 72 IU/L (ref 44–121)
BUN/Creatinine Ratio: 8 — ABNORMAL LOW (ref 9–20)
BUN: 8 mg/dL (ref 6–24)
Bilirubin Total: 0.4 mg/dL (ref 0.0–1.2)
CO2: 23 mmol/L (ref 20–29)
Calcium: 8.7 mg/dL (ref 8.7–10.2)
Chloride: 103 mmol/L (ref 96–106)
Creatinine, Ser: 0.95 mg/dL (ref 0.76–1.27)
GFR calc Af Amer: 104 mL/min/{1.73_m2} (ref >59)
GFR calc non Af Amer: 90 mL/min/{1.73_m2} (ref >59)
Globulin, Total: 2.2 g/dL (ref 1.5–4.5)
Glucose: 168 mg/dL — ABNORMAL HIGH (ref 65–99)
Potassium: 4.1 mmol/L (ref 3.5–5.2)
Sodium: 138 mmol/L (ref 134–144)
Total Protein: 6.6 g/dL (ref 6.0–8.5)

## 2020-02-24 ENCOUNTER — Other Ambulatory Visit (HOSPITAL_COMMUNITY)
Admission: RE | Admit: 2020-02-24 | Discharge: 2020-02-24 | Disposition: A | Discharge status: Home / Self Care | Payer: Commercial Managed Care - PPO | Source: Ambulatory Visit | Attending: Registered Nurse | Admitting: Registered Nurse

## 2020-02-24 ENCOUNTER — Ambulatory Visit (INDEPENDENT_AMBULATORY_CARE_PROVIDER_SITE_OTHER): Payer: Commercial Managed Care - PPO | Admitting: Registered Nurse

## 2020-02-24 ENCOUNTER — Other Ambulatory Visit: Payer: Self-pay

## 2020-02-24 ENCOUNTER — Encounter: Payer: Self-pay | Admitting: Registered Nurse

## 2020-02-24 VITALS — BP 145/84 | HR 80 | Temp 98.3°F | Ht 67.0 in | Wt 232.6 lb

## 2020-02-24 DIAGNOSIS — Z23 Encounter for immunization: Secondary | ICD-10-CM

## 2020-02-24 DIAGNOSIS — R300 Dysuria: Secondary | ICD-10-CM | POA: Diagnosis not present

## 2020-02-24 DIAGNOSIS — Z113 Encounter for screening for infections with a predominantly sexual mode of transmission: Secondary | ICD-10-CM | POA: Insufficient documentation

## 2020-02-24 LAB — POCT URINALYSIS DIP (CLINITEK)
Bilirubin, UA: NEGATIVE
Blood, UA: NEGATIVE
Glucose, UA: NEGATIVE mg/dL
Ketones, POC UA: NEGATIVE mg/dL
Leukocytes, UA: NEGATIVE
Nitrite, UA: NEGATIVE
Spec Grav, UA: >=1.03 — AB (ref 1.010–1.025)
Urobilinogen, UA: 0.2 E.U./dL
pH, UA: 6 (ref 5.0–8.0)

## 2020-02-24 NOTE — Patient Instructions (Signed)
° ° ° °  If you have lab work done today you will be contacted with your lab results within the next 2 weeks.  If you have not heard from us then please contact us. The fastest way to get your results is to register for My Chart. ° ° °IF you received an x-ray today, you will receive an invoice from [facility]. Please contact  Radiology at [phone] with questions or concerns regarding your invoice.  ° °IF you received labwork today, you will receive an invoice from [facility]. Please contact [facility] at [phone] with questions or concerns regarding your invoice.  ° °Our billing staff will not be able to assist you with questions regarding bills from these companies. ° °You will be contacted with the lab results as soon as they are available. The fastest way to get your results is to activate your My Chart account. Instructions are located on the last page of this paperwork. If you have not heard from us regarding the results in 2 weeks, please contact this office. °  ° ° ° °

## 2020-02-25 ENCOUNTER — Encounter: Payer: Self-pay | Admitting: Registered Nurse

## 2020-02-25 LAB — HIV ANTIBODY (ROUTINE TESTING W REFLEX): HIV Screen 4th Generation wRfx: NONREACTIVE

## 2020-02-25 LAB — RPR: RPR Ser Ql: NONREACTIVE

## 2020-02-25 NOTE — Telephone Encounter (Signed)
Called and informed patient. 

## 2020-02-26 LAB — URINE CYTOLOGY ANCILLARY ONLY
Chlamydia: NEGATIVE
Comment: NEGATIVE
Comment: NEGATIVE
Comment: NORMAL
Neisseria Gonorrhea: NEGATIVE
Trichomonas: NEGATIVE

## 2020-02-27 NOTE — Telephone Encounter (Signed)
Pt asking next steps from OV after labs are back

## 2020-03-03 ENCOUNTER — Ambulatory Visit (INDEPENDENT_AMBULATORY_CARE_PROVIDER_SITE_OTHER): Payer: Self-pay | Admitting: Nurse Practitioner

## 2020-03-03 ENCOUNTER — Encounter: Payer: Self-pay | Admitting: Nurse Practitioner

## 2020-03-03 VITALS — BP 122/86 | HR 80 | Ht 67.0 in | Wt 233.0 lb

## 2020-03-03 DIAGNOSIS — R7989 Other specified abnormal findings of blood chemistry: Secondary | ICD-10-CM

## 2020-03-03 DIAGNOSIS — R195 Other fecal abnormalities: Secondary | ICD-10-CM

## 2020-03-03 MED ORDER — SUPREP BOWEL PREP KIT 17.5-3.13-1.6 GM/177ML PO SOLN: 1.0000 | ORAL | 0 refills | Status: DC

## 2020-03-03 NOTE — Progress Notes (Signed)
ASSESSMENT AND PLAN    # 54 yo male referred for positive Cologuard study.  --Not anemic. Hgb 15.6 in May 2021 --No overt GI bleeding --Patient will be scheduled for a colonoscopy. The risks and benefits of colonoscopy with possible polypectomy / biopsies were discussed and the patient agrees to proceed.   # Mildly abnormal liver enzymes in Aug and Sept 2021.  --At risk for hepatic steatosis with obesity, DM, hyperlipidemia.  --Obtain RUQ [place].   # GERD --Very occasional heartburn depending on diet. Takes Prilosec as needed.   HISTORY OF PRESENT ILLNESS     Primary Gastroenterologist : [facility] Oretha Caprice, MD      Chief Complaint :positive Cologuard   Bradley Howe is a 54 y.o. male with PMH / [place] significant for,  but not necessarily limited to: HTN, hyperlipidemia, DM2, inguinal hernia repair.   Patient referred by PCP for evaluation of a positive Cologuard 01/11/20.   No bowel changes. No abdominal pain. No weight loss. No [place] of colon cancer.   Liver enzymes mildly elevated in Aug and Sept. No significant Etoh use. Drinks a beer or two during the week and on weekends may up to 4 drinks. Some weekends he has no Etoh.   Past Medical History:  Diagnosis Date   GERD (gastroesophageal reflux disease)    High cholesterol    Hypertension    Type II diabetes mellitus ([place])      Past Surgical History:  Procedure Laterality Date   INGUINAL HERNIA REPAIR Left 2013   Family History  Problem Relation Age of Onset   Hypertension Mother    Hypertension Father    Diabetes Paternal Grandmother    Heart disease Neg Hx    Colon cancer Neg Hx    Pancreatic cancer Neg Hx    Esophageal cancer Neg Hx    Social History   Tobacco Use   Smoking status: Former Smoker    Packs/day: 0.10    Years: 25.00    Pack years: 2.50    Types: Cigarettes   Smokeless tobacco: Never Used   Tobacco comment: "quit smoking cigarettes in 2013"  Substance Use Topics   Alcohol  use: Yes    Comment: occ   Drug use: No   Current Outpatient Medications  Medication Sig Dispense Refill   gabapentin (NEURONTIN) 100 MG capsule Take 1 capsule (100 mg total) by mouth at bedtime. 90 capsule 1   glipiZIDE (GLUCOTROL) 5 MG tablet Take 1 tablet (5 mg total) by mouth 2 (two) times daily. 90 tablet 1   insulin NPH-regular Human (70-30) 100 UNIT/ML injection Inject 52 Units into the skin 2 (two) times daily with a meal. 10 mL 6   lisinopril (ZESTRIL) 10 MG tablet Take 1 tablet (10 mg total) by mouth daily. 90 tablet 1   omeprazole (PRILOSEC) 20 MG capsule Take by mouth.     rosuvastatin (CRESTOR) 20 MG tablet Take 1 tablet (20 mg total) by mouth daily. 90 tablet 1   Continuous Blood Gluc Sensor ([place]) MISC Patient to use 1 system in a span of 14 days (Patient not taking: Reported on 03/03/2020) 1 each 1   No current facility-administered medications for this visit.   No Known Allergies   Review of Systems:  All other systems reviewed and negative except where noted in HPI.   PHYSICAL EXAM :    Wt Readings from Last 3 Encounters:  02/24/20 232 lb 9.6 oz (105.5 kg)  01/09/20 225 lb (102.1 kg)  12/19/19 230 lb 12.8 oz (104.7 kg)    Ht 5\' 7"  (1.702 m)    BMI 36.43 kg/m  Constitutional:  Pleasant male in no acute distress. Psychiatric: Normal mood and affect. Behavior is normal. EENT: Pupils normal.  Conjunctivae are normal. No scleral icterus. Neck supple.  Cardiovascular: Normal rate, regular rhythm. No edema Pulmonary/chest: Effort normal and breath sounds normal. No wheezing, rales or rhonchi. Abdominal: Soft, nondistended, nontender. Bowel sounds active throughout. There are no masses palpable. No hepatomegaly. Neurological: Alert and oriented to person place and time. Skin: Skin is warm and dry. No rashes noted.  Tye Savoy, NP  03/03/2020, 3:28 PM  Cc:  Referring Provider Wendie Agreste, MD

## 2020-03-03 NOTE — Patient Instructions (Signed)
You have been scheduled for an abdominal ultrasound at [facility] (1st floor of hospital) on 03/05/20 at 8:00am. Please arrive 15 minutes prior to your appointment for registration. Make certain not to have anything to eat or drink 6 hours prior to your appointment. Should you need to reschedule your appointment, please contact radiology at [phone]. This test typically takes about 30 minutes to perform.  You have been scheduled for a colonoscopy. Please follow written instructions given to you at your visit today.  Please pick up your prep supplies at the pharmacy within the next 1-3 days. If you use inhalers (even only as needed), please bring them with you on the day of your procedure.   We have sent the following medications to your pharmacy for you to pick up at your convenience: Suprep   Due to recent changes in healthcare laws, you may see the results of your imaging and laboratory studies on MyChart before your provider has had a chance to review them.  We understand that in some cases there may be results that are confusing or concerning to you. Not all laboratory results come back in the same time frame and the provider may be waiting for multiple results in order to interpret others.  Please give [place] 48 hours in order for your provider to thoroughly review all the results before contacting the office for clarification of your results.   Thank you for choosing me and [facility].  Tye Savoy NP

## 2020-03-04 NOTE — Progress Notes (Signed)
I agree with the above note, plan 

## 2020-03-05 ENCOUNTER — Ambulatory Visit (HOSPITAL_COMMUNITY)
Admission: RE | Admit: 2020-03-05 | Discharge: 2020-03-05 | Disposition: A | Discharge status: Home / Self Care | Payer: Commercial Managed Care - PPO | Source: Ambulatory Visit | Attending: Nurse Practitioner | Admitting: Nurse Practitioner

## 2020-03-05 ENCOUNTER — Other Ambulatory Visit: Payer: Self-pay

## 2020-03-05 DIAGNOSIS — R195 Other fecal abnormalities: Secondary | ICD-10-CM | POA: Diagnosis present

## 2020-03-05 DIAGNOSIS — R7989 Other specified abnormal findings of blood chemistry: Secondary | ICD-10-CM | POA: Insufficient documentation

## 2020-03-12 ENCOUNTER — Ambulatory Visit: Payer: Commercial Managed Care - PPO | Admitting: Endocrinology

## 2020-03-12 DIAGNOSIS — E119 Type 2 diabetes mellitus without complications: Secondary | ICD-10-CM

## 2020-03-16 ENCOUNTER — Telehealth: Payer: Self-pay | Admitting: Nurse Practitioner

## 2020-03-16 NOTE — Telephone Encounter (Signed)
Pt states he has not received the medication for the SUPREP.

## 2020-03-17 NOTE — Telephone Encounter (Signed)
Spoke with pt this morning. Informed him that RX was sent on 03/03/20. I have called and spoke with pharmacist at [facility] to verify that they have rx as well.They will fill rx and notify pt when ready for pick-up.

## 2020-03-17 NOTE — Telephone Encounter (Signed)
Patient is calling to follow up on previous request for prep medication has procedure scheduled for Friday 03/19/2020.

## 2020-03-19 ENCOUNTER — Encounter: Payer: Self-pay | Admitting: Gastroenterology

## 2020-03-19 ENCOUNTER — Other Ambulatory Visit: Payer: Self-pay

## 2020-03-19 ENCOUNTER — Ambulatory Visit (AMBULATORY_SURGERY_CENTER): Payer: Commercial Managed Care - PPO | Admitting: Gastroenterology

## 2020-03-19 ENCOUNTER — Telehealth: Payer: Self-pay | Admitting: Family Medicine

## 2020-03-19 VITALS — BP 121/79 | HR 69 | Temp 96.6°F | Resp 18 | Ht 67.0 in | Wt 233.0 lb

## 2020-03-19 DIAGNOSIS — D123 Benign neoplasm of transverse colon: Secondary | ICD-10-CM

## 2020-03-19 DIAGNOSIS — K5730 Diverticulosis of large intestine without perforation or abscess without bleeding: Secondary | ICD-10-CM | POA: Diagnosis not present

## 2020-03-19 DIAGNOSIS — D122 Benign neoplasm of ascending colon: Secondary | ICD-10-CM

## 2020-03-19 DIAGNOSIS — D125 Benign neoplasm of sigmoid colon: Secondary | ICD-10-CM | POA: Diagnosis not present

## 2020-03-19 DIAGNOSIS — D124 Benign neoplasm of descending colon: Secondary | ICD-10-CM

## 2020-03-19 DIAGNOSIS — R7989 Other specified abnormal findings of blood chemistry: Secondary | ICD-10-CM

## 2020-03-19 DIAGNOSIS — R195 Other fecal abnormalities: Secondary | ICD-10-CM

## 2020-03-19 MED ORDER — SODIUM CHLORIDE 0.9 % IV SOLN: 500.0000 mL | INTRAVENOUS | Status: DC

## 2020-03-19 NOTE — Patient Instructions (Signed)
Handouts given:  Polyps, Hemorrhoids Resume previous diet  continue current medications Await pathology results   YOU HAD AN ENDOSCOPIC PROCEDURE TODAY AT [facility]:   Refer to the procedure report that was given to you for any specific questions about what was found during the examination.  If the procedure report does not answer your questions, please call your gastroenterologist to clarify.  If you requested that your care partner not be given the details of your procedure findings, then the procedure report has been included in a sealed envelope for you to review at your convenience later.  YOU SHOULD EXPECT: Some feelings of bloating in the abdomen. Passage of more gas than usual.  Walking can help get rid of the air that was put into your GI tract during the procedure and reduce the bloating. If you had a lower endoscopy (such as a colonoscopy or flexible sigmoidoscopy) you may notice spotting of blood in your stool or on the toilet paper. If you underwent a bowel prep for your procedure, you may not have a normal bowel movement for a few days.  Please Note:  You might notice some irritation and congestion in your nose or some drainage.  This is from the oxygen used during your procedure.  There is no need for concern and it should clear up in a day or so.  SYMPTOMS TO REPORT IMMEDIATELY:   Following lower endoscopy (colonoscopy or flexible sigmoidoscopy):  Excessive amounts of blood in the stool  Significant tenderness or worsening of abdominal pains  Swelling of the abdomen that is new, acute  Fever of 100F or higher  For urgent or emergent issues, a gastroenterologist can be reached at any hour by calling [phone]. Do not use MyChart messaging for urgent concerns.    DIET:  We do recommend a small meal at first, but then you may proceed to your regular diet.  Drink plenty of fluids but you should avoid alcoholic beverages for 24 hours.  ACTIVITY:  You  should plan to take it easy for the rest of today and you should NOT DRIVE or use heavy machinery until tomorrow (because of the sedation medicines used during the test).    FOLLOW UP: Our staff will call the number listed on your records 48-72 hours following your procedure to check on you and address any questions or concerns that you may have regarding the information given to you following your procedure. If we do not reach you, we will leave a message.  We will attempt to reach you two times.  During this call, we will ask if you have developed any symptoms of COVID 19. If you develop any symptoms (ie: fever, flu-like symptoms, shortness of breath, cough etc.) before then, please call [phone].  If you test positive for Covid 19 in the 2 weeks post procedure, please call and report this information to [place].    If any biopsies were taken you will be contacted by phone or by letter within the next 1-3 weeks.  Please call us at [phone] if you have not heard about the biopsies in 3 weeks.    SIGNATURES/CONFIDENTIALITY: You and/or your care partner have signed paperwork which will be entered into your electronic medical record.  These signatures attest to the fact that that the information above on your After Visit Summary has been reviewed and is understood.  Full responsibility of the confidentiality of this discharge information lies with you and/or your care-partner.

## 2020-03-19 NOTE — Progress Notes (Signed)
Report given to PACU, vss 

## 2020-03-19 NOTE — Telephone Encounter (Signed)
Referral Followup °

## 2020-03-19 NOTE — Progress Notes (Signed)
Pt's states no medical or surgical changes since previsit or office visit. 

## 2020-03-19 NOTE — Progress Notes (Addendum)
Dr. Ardis Hughs requested patient to have first available office visit to discuss elevated liver enzymes. First available appt not until January. Patty notified.

## 2020-03-19 NOTE — Progress Notes (Signed)
Called to room to assist during endoscopic procedure.  Patient ID and intended procedure confirmed with present staff. Received instructions for my participation in the procedure from the performing physician.  

## 2020-03-19 NOTE — Op Note (Signed)
[place] Patient Name: Tenzin Edelman Procedure Date: 03/19/2020 10:51 AM MRN: [id number] Endoscopist: Milus Banister , MD Age: 54 Referring MD:  Date of Birth: [birth year] Gender: Male Account #: [id number] Procedure:                Colonoscopy Indications:              Positive Cologuard test Medicines:                Monitored Anesthesia Care Procedure:                Pre-Anesthesia Assessment:                           - Prior to the procedure, a History and Physical                            was performed, and patient medications and                            allergies were reviewed. The patient's tolerance of                            previous anesthesia was also reviewed. The risks                            and benefits of the procedure and the sedation                            options and risks were discussed with the patient.                            All questions were answered, and informed consent                            was obtained. Prior Anticoagulants: The patient has                            taken no previous anticoagulant or antiplatelet                            agents. ASA Grade Assessment: II - A patient with                            mild systemic disease. After reviewing the risks                            and benefits, the patient was deemed in                            satisfactory condition to undergo the procedure.                           After obtaining informed consent, the colonoscope  was passed under direct vision. Throughout the                            procedure, the patient's blood pressure, pulse, and                            oxygen saturations were monitored continuously. The                            Colonoscope was introduced through the anus and                            advanced to the the cecum, identified by                            appendiceal orifice and ileocecal valve. The                             colonoscopy was performed without difficulty. The                            patient tolerated the procedure well. The quality                            of the bowel preparation was good. Scope In: 10:55:26 AM Scope Out: 11:12:33 AM Scope Withdrawal Time: 0 hours 14 minutes 12 seconds  Total Procedure Duration: 0 hours 17 minutes 7 seconds  Findings:                 Three polyps were found in the descending colon,                            transverse colon and ascending colon. The polyps                            were 2 to 12 mm in size. The largest was                            pedunculated, located in the transverse, removed                            with snare/cautery. The others were sessile,                            removed with cold snare. jar 1                           Two polyps were found in the sigmoid colon. One was                            91mm, sessile, removed with cold snare. The other                            was 38mm, pedunculated,  removed with snare/cautery.                            Jar 2.                           Internal hemorrhoids were found. The hemorrhoids                            were small.                           The exam was otherwise without abnormality on                            direct and retroflexion views. Complications:            No immediate complications. Estimated blood loss:                            None. Estimated Blood Loss:     Estimated blood loss: none. Impression:               - Five polyps, all were removed and sent to                            pathology.                           - Hemorrhoids.                           - The examination was otherwise normal on direct                            and retroflexion views. Recommendation:           - Patient has a contact number available for                            emergencies. The signs and symptoms of potential                             delayed complications were discussed with the                            patient. Return to normal activities tomorrow.                            Written discharge instructions were provided to the                            patient.                           - Resume previous diet.                           - Continue present medications.                           -  Await pathology results. Milus Banister, MD 03/19/2020 11:17:46 AM This report has been signed electronically.

## 2020-03-23 ENCOUNTER — Telehealth: Payer: Self-pay

## 2020-03-23 NOTE — Telephone Encounter (Signed)
  Follow up Call-  Call back number 03/19/2020  Post procedure Call Back phone  # [phone]  Permission to leave phone message Yes  Some recent data might be hidden     Patient questions:  Do you have a fever, pain , or abdominal swelling? No. Pain Score  0 *  Have you tolerated food without any problems? Yes.    Have you been able to return to your normal activities? Yes.    Do you have any questions about your discharge instructions: Diet   No. Medications  No. Follow up visit  No.  Do you have questions or concerns about your Care? No.  Actions: * If pain score is 4 or above: 1. No action needed, pain <4.Have you developed a fever since your procedure? no  2.   Have you had an respiratory symptoms (SOB or cough) since your procedure? no  3.   Have you tested positive for COVID 19 since your procedure no  4.   Have you had any family members/close contacts diagnosed with the COVID 19 since your procedure?  no   If yes to any of these questions please route to Joylene John, RN and Joella Prince, RN

## 2020-03-24 ENCOUNTER — Telehealth: Payer: Self-pay

## 2020-03-24 NOTE — Telephone Encounter (Signed)
Attempted to reach patient by phone to schedule appointment in January 2022, but there was no answer and voicemail was full.  Will continue efforts.

## 2020-03-24 NOTE — Telephone Encounter (Signed)
-----   Message from Rande Brunt Monday, RN sent at 03/19/2020 11:35 AM EDT ----- Hi there! Dr. Ardis Hughs wants to see this patient at the next first available OV which isnt until January and the schedule isnt out yet to follow up on his elevated LFTs. Just wanted you all to be aware.  Thanks

## 2020-03-25 NOTE — Telephone Encounter (Signed)
Left message for patient to return call to schedule follow up appointment in January 2022.  Will continue efforts.

## 2020-03-26 ENCOUNTER — Encounter: Payer: Self-pay | Admitting: Gastroenterology

## 2020-03-29 NOTE — Telephone Encounter (Signed)
Patient aware of appointment scheduled with Dr Ardis Hughs on 06-04-2020 at 3:40pm.  Patient agreed to plan and verbalized understanding.  No further questions.

## 2020-04-25 ENCOUNTER — Encounter: Payer: Self-pay | Admitting: Registered Nurse

## 2020-04-25 NOTE — Progress Notes (Signed)
Established Patient Office Visit  Subjective:  Patient ID: Bradley Howe, male    DOB: 01-31-1966  Age: 54 y.o. MRN: [id number]  CC:  Chief Complaint  Patient presents with   STD check    Pt stated that he has been experiencing a funny feeling (tingling) with his penis for about 1 wk.    HPI Bradley Howe presents for sti check  No known exposure Having some tingling at meatus of penis for around 1 week. Worse when voiding urine. No discharge, pain, or testicular pain. No other symptoms. Has been in monogamous relationship  Due for flu shot and pna vaccine - both given today  Past Medical History:  Diagnosis Date   GERD (gastroesophageal reflux disease)    High cholesterol    Hypertension    Type II diabetes mellitus ([place])     Past Surgical History:  Procedure Laterality Date   INGUINAL HERNIA REPAIR Left 2013    Family History  Problem Relation Age of Onset   Hypertension Mother    Hypertension Father    Diabetes Paternal Grandmother    Heart disease Neg Hx    Colon cancer Neg Hx    Pancreatic cancer Neg Hx    Esophageal cancer Neg Hx    Stomach cancer Neg Hx    Rectal cancer Neg Hx     Social History   Socioeconomic History   Marital status: Married    Spouse name: Not on file   Number of children: Not on file   Years of education: Not on file   Highest education level: Not on file  Occupational History   Not on file  Tobacco Use   Smoking status: Former Smoker    Packs/day: 0.10    Years: 25.00    Pack years: 2.50    Types: Cigarettes   Smokeless tobacco: Never Used   Tobacco comment: "quit smoking cigarettes in 2013"  Substance and Sexual Activity   Alcohol use: Yes    Comment: occ   Drug use: No   Sexual activity: Yes  Other Topics Concern   Not on file  Social History Narrative   Not on file   Social Determinants of Health   Financial Resource Strain: Not on file  Food Insecurity: Not on file   Transportation Needs: Not on file  Physical Activity: Not on file  Stress: Not on file  Social Connections: Not on file  Intimate Partner Violence: Not on file    Outpatient Medications Prior to Visit  Medication Sig Dispense Refill   Continuous Blood Gluc Sensor (FREESTYLE [place]) [place] Patient to use 1 system in a span of 14 days (Patient not taking: Reported on 03/03/2020) 1 each 1   gabapentin (NEURONTIN) 100 MG capsule Take 1 capsule (100 mg total) by mouth at bedtime. 90 capsule 1   glipiZIDE (GLUCOTROL) 5 MG tablet Take 1 tablet (5 mg total) by mouth 2 (two) times daily. 90 tablet 1   insulin NPH-regular Human (70-30) 100 UNIT/ML injection Inject 52 Units into the skin 2 (two) times daily with a meal. 10 mL 6   lisinopril (ZESTRIL) 10 MG tablet Take 1 tablet (10 mg total) by mouth daily. 90 tablet 1   omeprazole (PRILOSEC) 20 MG capsule Take by mouth.     rosuvastatin (CRESTOR) 20 MG tablet Take 1 tablet (20 mg total) by mouth daily. 90 tablet 1   No facility-administered medications prior to visit.    No Known Allergies  ROS  Review of Systems  Constitutional: Negative.   HENT: Negative.   Eyes: Negative.   Respiratory: Negative.   Cardiovascular: Negative.   Gastrointestinal: Negative.   Genitourinary: Negative.   Musculoskeletal: Negative.   Skin: Negative.   Neurological: Negative.   Psychiatric/Behavioral: Negative.   All other systems reviewed and are negative.     Objective:    Physical Exam Constitutional:      General: He is not in acute distress.    Appearance: Normal appearance. He is normal weight. He is not ill-appearing, toxic-appearing or diaphoretic.  Cardiovascular:     Rate and Rhythm: Normal rate and regular rhythm.     Heart sounds: Normal heart sounds. No murmur heard. No friction rub. No gallop.   Pulmonary:     Effort: Pulmonary effort is normal. No respiratory distress.     Breath sounds: Normal breath sounds. No  stridor. No wheezing, rhonchi or rales.  Chest:     Chest wall: No tenderness.  Neurological:     General: No focal deficit present.     Mental Status: He is alert and oriented to person, place, and time. Mental status is at baseline.  Psychiatric:        Mood and Affect: Mood normal.        Behavior: Behavior normal.        Thought Content: Thought content normal.        Judgment: Judgment normal.     BP (!) 145/84 (BP Location: Right Arm, Patient Position: Sitting, Cuff Size: Large)    Pulse 80    Temp 98.3 F (36.8 C) (Temporal)    Ht 5\' 7"  (1.702 m)    Wt 232 lb 9.6 oz (105.5 kg)    SpO2 95%    BMI 36.43 kg/m  Wt Readings from Last 3 Encounters:  03/19/20 233 lb (105.7 kg)  03/03/20 233 lb (105.7 kg)  02/24/20 232 lb 9.6 oz (105.5 kg)     Health Maintenance Due  Topic Date Due   OPHTHALMOLOGY EXAM  02/24/2020   COVID-19 Vaccine (3 - Booster for Moderna series) 04/04/2020    There are no preventive care reminders to display for this patient.  No results found for: TSH Lab Results  Component Value Date   WBC 8.5 10/03/2019   HGB 15.6 10/03/2019   HCT 46.0 10/03/2019   MCV 86.9 10/03/2019   PLT 270 10/03/2019   Lab Results  Component Value Date   NA 138 01/30/2020   K 4.1 01/30/2020   CO2 23 01/30/2020   GLUCOSE 168 (H) 01/30/2020   BUN 8 01/30/2020   CREATININE 0.95 01/30/2020   BILITOT 0.4 01/30/2020   ALKPHOS 72 01/30/2020   AST 45 (H) 01/30/2020   ALT 38 01/30/2020   PROT 6.6 01/30/2020   ALBUMIN 4.4 01/30/2020   CALCIUM 8.7 01/30/2020   ANIONGAP 10 10/03/2019   Lab Results  Component Value Date   CHOL 163 12/19/2019   Lab Results  Component Value Date   HDL 32 (L) 12/19/2019   Lab Results  Component Value Date   LDLCALC 80 12/19/2019   Lab Results  Component Value Date   TRIG 311 (H) 12/19/2019   Lab Results  Component Value Date   CHOLHDL 5.1 (H) 12/19/2019   Lab Results  Component Value Date   HGBA1C 9.5 (H) 12/19/2019       Assessment & Plan:   Problem List Items Addressed This Visit   None   Visit Diagnoses  Screening for STDs (sexually transmitted diseases)    -  Primary   Relevant Orders   RPR (Completed)   HIV antibody (with reflex) (Completed)   Urine cytology ancillary only (Completed)   Need for prophylactic vaccination and inoculation against influenza       Relevant Orders   Flu Vaccine QUAD 36+ mos IM (Completed)   Need for prophylactic vaccination against Streptococcus pneumoniae (pneumococcus)       Relevant Orders   Pneumococcal polysaccharide vaccine 23-valent greater than or equal to 2yo subcutaneous/IM (Completed)   Dysuria       Relevant Orders   POCT URINALYSIS DIP (CLINITEK) (Completed)      No orders of the defined types were placed in this encounter.   Follow-up: No follow-ups on file.   PLAN  No evidence of acute UTI  Will send out STI testing  Follow up as warranted  Flu shot and pna vaccine given today.  Patient encouraged to call clinic with any questions, comments, or concerns.  Maximiano Coss, NP

## 2020-06-04 ENCOUNTER — Ambulatory Visit: Payer: Self-pay | Admitting: Gastroenterology

## 2020-06-16 ENCOUNTER — Ambulatory Visit: Payer: Managed Care, Other (non HMO) | Admitting: Physician Assistant

## 2020-06-30 IMAGING — CR DG CHEST 2V
2 series · 2 of 2 positions shown · non-contrast
Comparison: 06/20/2019

CLINICAL DATA: Cough in central chest pain since yesterday.
Ex-smoker.

EXAM:
CHEST - 2 VIEW

[chest pa]
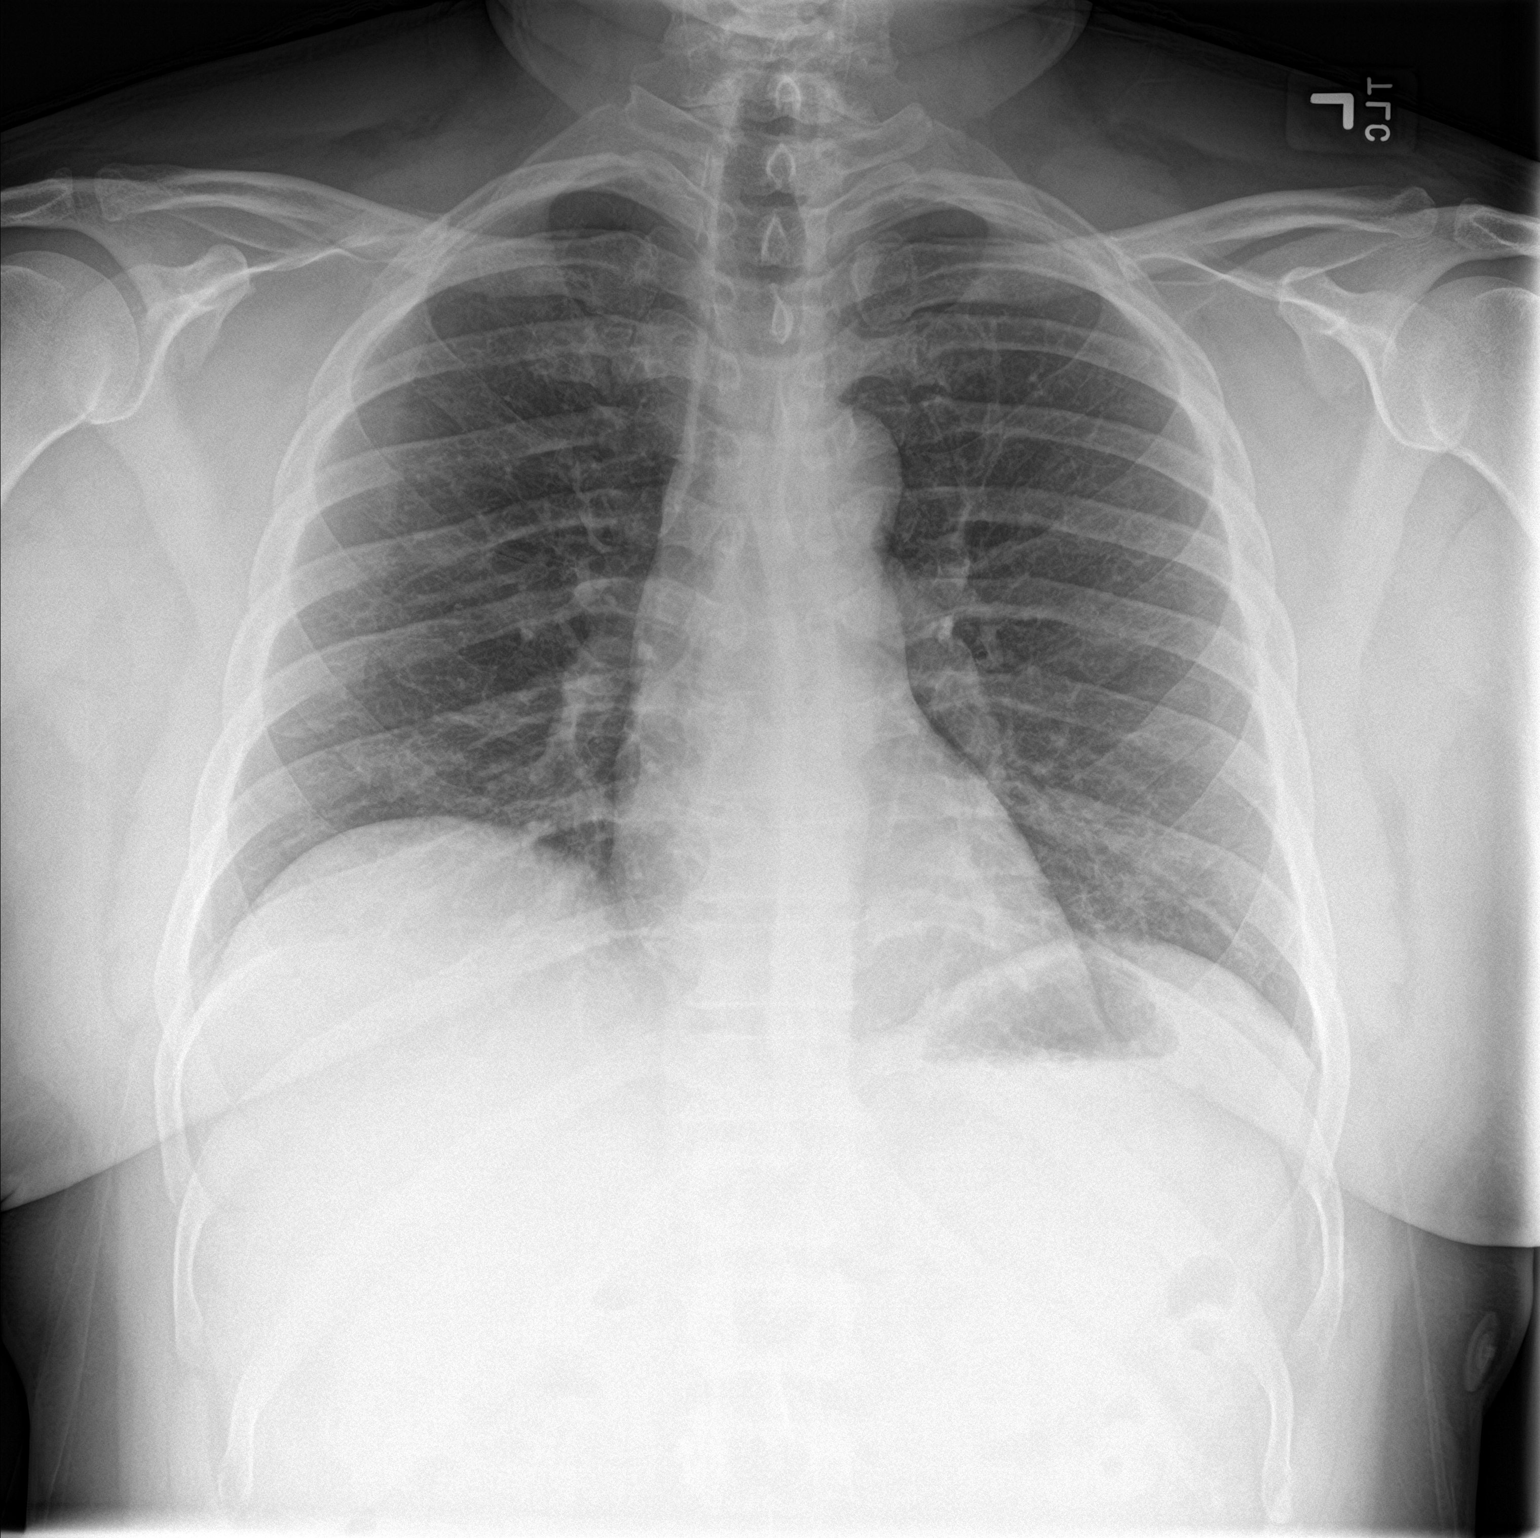

[chest lat]
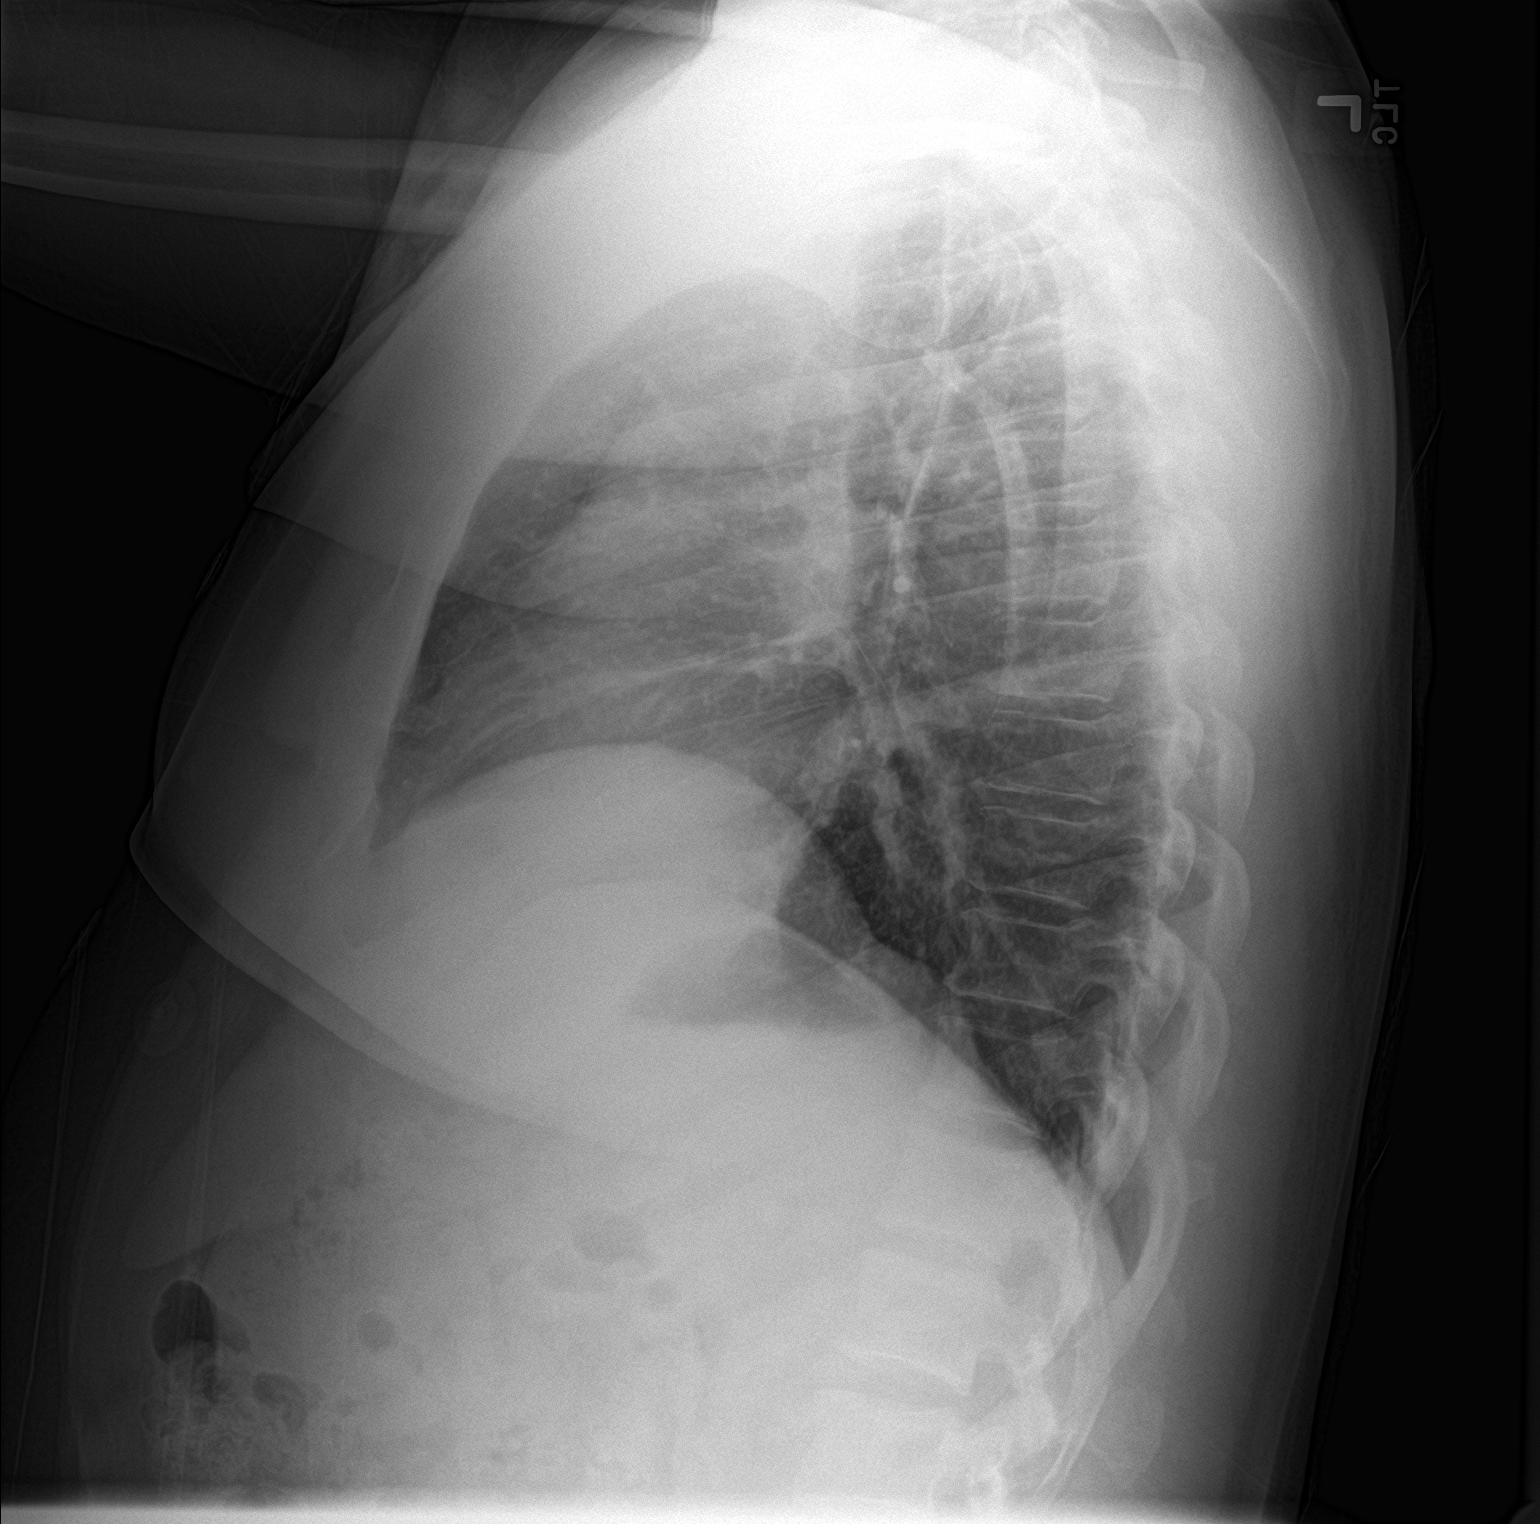

[2 of 2 positions shown; findings below may reference images not displayed]

FINDINGS: Normal sized heart. Clear lungs. Stable minimal peribronchial
thickening and accentuation of the interstitial markings. Minimal
lower thoracic spine degenerative changes.
IMPRESSION: No acute abnormality. Stable minimal chronic bronchitic changes.

## 2020-08-12 ENCOUNTER — Encounter: Payer: Self-pay | Admitting: Physician Assistant

## 2020-08-12 ENCOUNTER — Telehealth: Payer: Managed Care, Other (non HMO) | Admitting: Physician Assistant

## 2020-08-12 DIAGNOSIS — Z20822 Contact with and (suspected) exposure to covid-19: Secondary | ICD-10-CM

## 2020-08-12 MED ORDER — ONDANSETRON 4 MG PO TBDP: 4.0000 mg | ORAL_TABLET | Freq: Three times a day (TID) | ORAL | 0 refills | Status: DC | PRN

## 2020-08-12 NOTE — Telephone Encounter (Signed)
Pt requesting address he can pick up letter?

## 2020-08-12 NOTE — Progress Notes (Signed)
Mr. Bradley Howe, Bradley Howe are scheduled for a virtual visit with your provider today.    Just as we do with appointments in the office, we must obtain your consent to participate.  Your consent will be active for this visit and any virtual visit you may have with one of our providers in the next 365 days.    If you have a MyChart account, I can also send a copy of this consent to you electronically.  All virtual visits are billed to your insurance company just like a traditional visit in the office.  As this is a virtual visit, video technology does not allow for your provider to perform a traditional examination.  This may limit your provider's ability to fully assess your condition.  If your provider identifies any concerns that need to be evaluated in person or the need to arrange testing such as labs, EKG, etc, we will make arrangements to do so.    Although advances in technology are sophisticated, we cannot ensure that it will always work on either your end or our end.  If the connection with a video visit is poor, we may have to switch to a telephone visit.  With either a video or telephone visit, we are not always able to ensure that we have a secure connection.   I need to obtain your verbal consent now.   Are you willing to proceed with your visit today?   Bradley Howe has provided verbal consent on 08/12/2020 for a virtual visit (video or telephone).   Bradley Rio, PA-C 08/12/2020  8:31 AM   Virtual Visit via Video   I connected with patient on 08/12/20 at  8:45 AM EDT by a video enabled telemedicine application and verified that I am speaking with the correct person using two identifiers.  Location patient: Home Location provider: [place] participating in the virtual visit: Patient, Provider  I discussed the limitations of evaluation and management by telemedicine and the availability of in person appointments. The patient expressed understanding and agreed  to proceed.  Subjective:   HPI:   Patient presents via [place] today complaining of abrupt onset of fever (T-max 101), body aches, chills (resolved now), cough, sore throat, diarrhea and one episode of nonbloody emesis.  Symptoms started last night.  Does note fatigue and mild nausea this morning.  Still having frequent diarrhea but denies melena, hematochezia or tenesmus.  Has used Chloraseptic spray to help with sore throat.  Is taking Tylenol for fever and aches with good improvement.  Denies any known sick contacts states he had jury duty last week and was around a large number of people.  Is up-to-date on flu shot and COVID vaccines.  Patient has a diabetic and endorses sugars have been mildly elevated this morning, but will not give a specific number after multiple requests.  States he has not taken his insulin or Glucotrol this morning yet as he has not been able to keep any food down.  Has had orange juice and water this morning without subsequent emesis.  ROS:   See pertinent positives and negatives per HPI.  Patient Active Problem List   Diagnosis Date Noted  . Obesity, unspecified 08/20/2019  . Ex-smoker 08/20/2019  . GERD (gastroesophageal reflux disease) 08/20/2019  . Elevated LFTs 08/20/2019  . Angina pectoris ([place])   . Chest pain 09/17/2015  . Type 2 diabetes mellitus without complications ([place]) 53/64/6803  . Hypercholesterolemia 01/15/2013  . Renal insufficiency 07/26/2012  .  Hyponatremia 07/26/2012  . Essential (primary) hypertension 07/26/2012  . Left inguinal hernia 05/19/2011    Social History   Tobacco Use  . Smoking status: Former Smoker    Packs/day: 0.10    Years: 25.00    Pack years: 2.50    Types: Cigarettes  . Smokeless tobacco: Never Used  . Tobacco comment: "quit smoking cigarettes in 2013"  Substance Use Topics  . Alcohol use: Yes    Comment: occ    Current Outpatient Medications:  .  Continuous Blood Gluc Sensor ([place]) MISC, Patient to use 1 system in a span of 14 days (Patient not taking: Reported on 03/03/2020), Disp: 1 each, Rfl: 1 .  gabapentin (NEURONTIN) 100 MG capsule, Take 1 capsule (100 mg total) by mouth at bedtime., Disp: 90 capsule, Rfl: 1 .  glipiZIDE (GLUCOTROL) 5 MG tablet, Take 1 tablet (5 mg total) by mouth 2 (two) times daily., Disp: 90 tablet, Rfl: 1 .  insulin NPH-regular Human (70-30) 100 UNIT/ML injection, Inject 52 Units into the skin 2 (two) times daily with a meal., Disp: 10 mL, Rfl: 6 .  lisinopril (ZESTRIL) 10 MG tablet, Take 1 tablet (10 mg total) by mouth daily., Disp: 90 tablet, Rfl: 1 .  Na Sulfate-K Sulfate-Mg Sulf (SUPREP BOWEL PREP KIT) 17.5-3.13-1.6 GM/177ML SOLN, Take 1 kit by mouth as directed. For colonoscopy prep, Disp: 354 mL, Rfl: 0 .  omeprazole (PRILOSEC) 20 MG capsule, Take by mouth., Disp: , Rfl:  .  rosuvastatin (CRESTOR) 20 MG tablet, Take 1 tablet (20 mg total) by mouth daily., Disp: 90 tablet, Rfl: 1  Current Facility-Administered Medications:  .  0.9 %  sodium chloride infusion, 500 mL, Intravenous, Continuous, Milus Banister, MD  No Known Allergies  Objective:   There were no vitals taken for this visit.  Patient is well-developed, well-nourished in no acute distress.  Resting comfortably at home.  Head is normocephalic, atraumatic.  No labored breathing.  Speech is clear and coherent with logical content.  Patient is alert and oriented at baseline.   Assessment and Plan:   1. Suspected COVID-19 virus infection Given abrupt onset of febrile illness with URI symptoms and GI symptoms, favor Covid over influenza.  Giving his history of immunocompromise being a diabetic, he needs testing so we can determine next appropriate steps as he would qualify for antiviral medicine for influenza or monoclonal antibody infusion/antiviral for Covid.  He is going to try to get in with his local [facility] this morning for Covid and flu swab.  Gave patient a list of  testing options through [facility] health as well.  He is to quarantine until results are in and next steps are determined.  He is to continue keeping hydrated.  Will give Rx for Zofran to help with nausea and vomiting.  Restart solid foods following brat diet when ready.  Okay to use Imodium as this is most likely viral in nature.  Patient to try to get plenty of rest.  OTC medications and other supportive measures reviewed.  He is to keep close watch on his glucose levels and is not to take his Glucotrol or insulin level if not eating, especially without consulting his primary care provider first.  Strict ER precautions discussed with patient as well.  Work note written.  Copy of written instructions were sent to patient's MyChart. - MyChart COVID-19 home monitoring program; Future    Bradley Howe, [place] 08/12/2020

## 2020-08-12 NOTE — Telephone Encounter (Signed)
You evaluated pt for suspected COVID pt states was supposed to have work note, is this acceptable and for how long?

## 2020-08-12 NOTE — Patient Instructions (Signed)
Instructions sent to patients MyChart.

## 2020-09-20 ENCOUNTER — Other Ambulatory Visit: Payer: Self-pay

## 2020-09-20 ENCOUNTER — Ambulatory Visit (HOSPITAL_COMMUNITY)
Admission: EM | Admit: 2020-09-20 | Discharge: 2020-09-20 | Disposition: A | Discharge status: Home / Self Care | Payer: Managed Care, Other (non HMO) | Attending: Emergency Medicine | Admitting: Emergency Medicine

## 2020-09-20 ENCOUNTER — Encounter (HOSPITAL_COMMUNITY): Payer: Self-pay

## 2020-09-20 DIAGNOSIS — G8929 Other chronic pain: Secondary | ICD-10-CM

## 2020-09-20 DIAGNOSIS — M5442 Lumbago with sciatica, left side: Secondary | ICD-10-CM | POA: Diagnosis not present

## 2020-09-20 MED ORDER — CYCLOBENZAPRINE HCL 10 MG PO TABS: 10.0000 mg | ORAL_TABLET | Freq: Two times a day (BID) | ORAL | 0 refills | Status: DC | PRN

## 2020-09-20 MED ORDER — NAPROXEN 500 MG PO TABS: 500.0000 mg | ORAL_TABLET | Freq: Two times a day (BID) | ORAL | 0 refills | Status: AC

## 2020-09-20 NOTE — Discharge Instructions (Addendum)
Take the Naproxen twice a day for the next 5-7 days.  Then you can take it as needed.   You can take the Flexeril as needed for muscle pain and spasms.  Do not take it before driving as it can make you sleepy.   Rest and do gentle exercises/stretching.    Follow up with either your Primary Care or orthopedics or go to the Emergency Department if symptoms worsen or do not improve in the next few weeks.

## 2020-09-20 NOTE — ED Provider Notes (Signed)
[place]    CSN: [id number] Arrival date & time: 09/20/20  9833      History   Chief Complaint Chief Complaint  Patient presents with  . Back Pain    HPI Bradley Howe is a 55 y.o. male.   Patient here for evaluation of lower back pain especially on the left.  Reports pain has been ongoing for the past 2 days.  Patient with history of back pain and sciatica.  Denies any loss of bowel or bladder control.  Reports back pain occasionally radiates to the left side and will occasionally get shooting pain down left leg.  Patient with no difficulty ambulating. Denies any trauma, injury, or other precipitating event. Denies any fevers, chest pain, shortness of breath, N/V/D, numbness, tingling, weakness, abdominal pain, or headaches.     The history is provided by the patient.  Back Pain   Past Medical History:  Diagnosis Date  . GERD (gastroesophageal reflux disease)   . High cholesterol   . Hypertension   . Type II diabetes mellitus [facility])     Patient Active Problem List   Diagnosis Date Noted  . Obesity, unspecified 08/20/2019  . Ex-smoker 08/20/2019  . GERD (gastroesophageal reflux disease) 08/20/2019  . Elevated LFTs 08/20/2019  . Angina pectoris ([place])   . Chest pain 09/17/2015  . Type 2 diabetes mellitus without complications ([place]) 82/50/5397  . Hypercholesterolemia 01/15/2013  . Renal insufficiency 07/26/2012  . Hyponatremia 07/26/2012  . Essential (primary) hypertension 07/26/2012  . Left inguinal hernia 05/19/2011    Past Surgical History:  Procedure Laterality Date  . INGUINAL HERNIA REPAIR Left 2013       Home Medications    Prior to Admission medications   Medication Sig Start Date End Date Taking? Authorizing Provider  cyclobenzaprine (FLEXERIL) 10 MG tablet Take 1 tablet (10 mg total) by mouth 2 (two) times daily as needed for muscle spasms. 09/20/20  Yes Pearson Forster, NP  gabapentin (NEURONTIN) 100 MG capsule Take 1 capsule (100 mg  total) by mouth at bedtime. 12/19/19  Yes Wendie Agreste, MD  glipiZIDE (GLUCOTROL) 5 MG tablet Take 1 tablet (5 mg total) by mouth 2 (two) times daily. 12/19/19  Yes Wendie Agreste, MD  insulin NPH-regular Human (70-30) 100 UNIT/ML injection Inject 52 Units into the skin 2 (two) times daily with a meal. 12/19/19  Yes Wendie Agreste, MD  lisinopril (ZESTRIL) 10 MG tablet Take 1 tablet (10 mg total) by mouth daily. 12/19/19  Yes Wendie Agreste, MD  naproxen (NAPROSYN) 500 MG tablet Take 1 tablet (500 mg total) by mouth 2 (two) times daily for 7 days. 09/20/20 09/27/20 Yes Pearson Forster, NP  omeprazole (PRILOSEC) 20 MG capsule Take by mouth. 05/12/11  Yes [provider]  ondansetron (ZOFRAN ODT) 4 MG disintegrating tablet Take 1 tablet (4 mg total) by mouth every 8 (eight) hours as needed for nausea or vomiting. 08/12/20  Yes Brunetta Jeans, PA-C  rosuvastatin (CRESTOR) 20 MG tablet Take 1 tablet (20 mg total) by mouth daily. 12/19/19  Yes Wendie Agreste, MD  Continuous Blood Gluc Sensor ([place]) MISC Patient to use 1 system in a span of 14 days Patient not taking: Reported on 03/03/2020 01/20/20   Wendie Agreste, MD  Na Sulfate-K Sulfate-Mg Sulf (SUPREP BOWEL PREP KIT) 17.5-3.13-1.6 GM/177ML SOLN Take 1 kit by mouth as directed. For colonoscopy prep 03/03/20   Willia Craze, NP    Family  History Family History  Problem Relation Age of Onset  . Hypertension Mother   . Hypertension Father   . Diabetes Paternal Grandmother   . Heart disease Neg Hx   . Colon cancer Neg Hx   . Pancreatic cancer Neg Hx   . Esophageal cancer Neg Hx   . Stomach cancer Neg Hx   . Rectal cancer Neg Hx     Social History Social History   Tobacco Use  . Smoking status: Former Smoker    Packs/day: 0.10    Years: 25.00    Pack years: 2.50    Types: Cigarettes  . Smokeless tobacco: Never Used  . Tobacco comment: "quit smoking cigarettes in 2013"  Substance Use Topics   . Alcohol use: Yes    Comment: occ  . Drug use: No     Allergies   Patient has no known allergies.   Review of Systems Review of Systems  Musculoskeletal: Positive for back pain.  All other systems reviewed and are negative.    Physical Exam Triage Vital Signs ED Triage Vitals  Enc Vitals Group     BP 09/20/20 0848 137/87     Pulse Rate 09/20/20 0848 77     Resp 09/20/20 0848 18     Temp 09/20/20 0848 98.8 F (37.1 C)     Temp Source 09/20/20 0848 Oral     SpO2 09/20/20 0848 99 %     Weight --      Height --      Head Circumference --      Peak Flow --      Pain Score 09/20/20 0846 9     Pain Loc --      Pain Edu? --      Excl. in [place]? --    No data found.  Updated Vital Signs BP 137/87 (BP Location: Left Arm)   Pulse 77   Temp 98.8 F (37.1 C) (Oral)   Resp 18   SpO2 99%   Visual Acuity Right Eye Distance:   Left Eye Distance:   Bilateral Distance:    Right Eye Near:   Left Eye Near:    Bilateral Near:     Physical Exam Vitals and nursing note reviewed.  Constitutional:      General: He is not in acute distress.    Appearance: Normal appearance. He is not ill-appearing, toxic-appearing or diaphoretic.  HENT:     Head: Normocephalic and atraumatic.  Eyes:     Conjunctiva/sclera: Conjunctivae normal.  Cardiovascular:     Rate and Rhythm: Normal rate and regular rhythm.     Pulses: Normal pulses.     Heart sounds: Normal heart sounds.  Pulmonary:     Effort: Pulmonary effort is normal.     Breath sounds: Normal breath sounds.  Abdominal:     General: Abdomen is flat. There is no distension.     Tenderness: There is no abdominal tenderness. There is no right CVA tenderness or left CVA tenderness.     Hernia: No hernia is present.  Musculoskeletal:        General: Normal range of motion.     Cervical back: Normal and normal range of motion.     Thoracic back: Normal.     Lumbar back: Tenderness (left lower back) present. No swelling,  deformity, signs of trauma or bony tenderness. Normal range of motion. Negative right straight leg raise test and negative left straight leg raise test. No scoliosis.  Skin:  General: Skin is warm and dry.  Neurological:     General: No focal deficit present.     Mental Status: He is alert and oriented to person, place, and time.  Psychiatric:        Mood and Affect: Mood normal.      UC Treatments / Results  Labs (all labs ordered are listed, but only abnormal results are displayed) Labs Reviewed - No data to display  EKG   Radiology No results found.  Procedures Procedures (including critical care time)  Medications Ordered in UC Medications - No data to display  Initial Impression / Assessment and Plan / UC Course  I have reviewed the triage vital signs and the nursing notes.  Pertinent labs & imaging results that were available during my care of the patient were reviewed by me and considered in my medical decision making (see chart for details).     Assessment negative for red flags or concerns.  History of sciatica and lower back pain.  Will treat with Naproxen BID for the next 5-7 days, then PRN.  May take flexeril as needed for muscle pain and spasms.  Rest and complete gentle exercises/stretching.  Recommend following up with PCP or ortho for long-term management of back pain.    Final Clinical Impressions(s) / UC Diagnoses   Final diagnoses:  Chronic left-sided low back pain with left-sided sciatica     Discharge Instructions     Take the Naproxen twice a day for the next 5-7 days.  Then you can take it as needed.   You can take the Flexeril as needed for muscle pain and spasms.  Do not take it before driving as it can make you sleepy.   Rest and do gentle exercises/stretching.    Follow up with either your Primary Care or orthopedics or go to the Emergency Department if symptoms worsen or do not improve in the next few weeks.      ED Prescriptions     Medication Sig Dispense Auth. Provider   naproxen (NAPROSYN) 500 MG tablet Take 1 tablet (500 mg total) by mouth 2 (two) times daily for 7 days. 30 tablet Pearson Forster, NP   cyclobenzaprine (FLEXERIL) 10 MG tablet Take 1 tablet (10 mg total) by mouth 2 (two) times daily as needed for muscle spasms. 10 tablet Pearson Forster, NP     PDMP not reviewed this encounter.   Pearson Forster, NP 09/20/20 [phone]

## 2020-09-20 NOTE — ED Triage Notes (Signed)
Pt here with severe lower back pain worsening over the past 2 days. States has had back pain for years but that it has worsened in the past 2 days but does not remember straining or otherwise injuring it.

## 2020-10-04 ENCOUNTER — Encounter: Payer: Self-pay | Admitting: Physician Assistant

## 2020-10-04 ENCOUNTER — Telehealth: Payer: Managed Care, Other (non HMO) | Admitting: Physician Assistant

## 2020-10-04 DIAGNOSIS — K0889 Other specified disorders of teeth and supporting structures: Secondary | ICD-10-CM

## 2020-10-04 MED ORDER — PENICILLIN V POTASSIUM 500 MG PO TABS: 500.0000 mg | ORAL_TABLET | Freq: Three times a day (TID) | ORAL | 0 refills | Status: AC

## 2020-10-04 NOTE — Patient Instructions (Addendum)
1. Pain, dental  - known dental carrie with increasing pain, suspect dental infection.  Will give Penicillin. Encouraged symptom management with Aleve and Tylenol as needed and close follow-up with dentist.   Dental Pain Dental pain is often a sign that something is wrong with your teeth or gums. You can also have pain after a dental treatment. If you have dental pain, it is important to contact your dentist, especially if the cause of the pain is not known. Dental pain may hurt a lot or a little and can be caused by many things, including:  Tooth decay (cavities or caries).  Infection.  The inner part of the tooth being filled with pus (an abscess).  Injury.  A crack in the tooth.  Gums that move back and expose the root of a tooth.  Gum disease.  Abnormal grinding or clenching of teeth.  Not taking good care of your teeth. Sometimes the cause of pain is not known. You may have pain all the time, or it may happen only when you are:  Chewing.  Exposed to hot or cold temperatures.  Eating or drinking foods or drinks that have a lot of sugar in them, such as soda or candy. Follow these instructions at home: Medicines  Take over-the-counter and prescription medicines only as told by your dentist.  If you were prescribed an antibiotic medicine, take it as told by your dentist. Do not stop taking it even if you start to feel better. Eating and drinking Do not eat foods or drinks that cause you pain. These include:  Very hot or very cold foods or drinks.  Sweet or sugary foods or drinks. Managing pain and swelling  If told, put ice on the painful area of your face. To do this: ? Put ice in a plastic bag. ? Place a towel between your skin and the bag. ? Leave the ice on for 20 minutes, 2-3 times a day. ? Take off the ice if your skin turns bright red. This is very important. If you cannot feel pain, heat, or cold, you have a greater risk of damage to the area.   Brushing  your teeth  Brush your teeth twice a day using a fluoride toothpaste.  Use a toothpaste made for sensitive teeth as told by your dentist.  Use a soft toothbrush. General instructions  Floss your teeth at least once a day.  Do not put heat on the outside of your face.  Rinse your mouth often with salt water. To make salt water, dissolve -1 tsp (3-6 g) of salt in 1 cup (237 mL) of warm water.  Watch your dental pain. Let your dentist know if there are any changes.  Keep all follow-up visits. Contact a dentist if:  You have dental pain and you do not know why.  Medicine does not help your pain.  Your symptoms get worse.  You have new symptoms. Get help right away if:  You cannot open your mouth.  You are having trouble breathing or swallowing.  You have a fever.  Your face, neck, or jaw is swollen. These symptoms may be an emergency. Get help right away. Call your local emergency services (911 in the [place]  Do not wait to see if the symptoms will go away.  Do not drive yourself to the hospital. Summary  Dental pain may be caused by many things, including tooth decay, injury, or infection. In some cases, the cause is not known.  Dental pain  may hurt a lot or very little. You may have pain all the time, or you may have it only when you eat or drink.  Take over-the-counter and prescription medicines only as told by your dentist.  Watch your dental pain for any changes. Let your dentist know if symptoms get worse. This information is not intended to replace advice given to you by your health care provider. Make sure you discuss any questions you have with your health care provider. Document Revised: 02/04/2020 Document Reviewed: 02/04/2020 Elsevier Patient Education  2021 [employer].

## 2020-10-04 NOTE — Progress Notes (Signed)
Bradley Howe, Bradley Howe are scheduled for a virtual visit with your provider today.    Just as we do with appointments in the office, we must obtain your consent to participate.  Your consent will be active for this visit and any virtual visit you may have with one of our providers in the next 365 days.    If you have a MyChart account, I can also send a copy of this consent to you electronically.  All virtual visits are billed to your insurance company just like a traditional visit in the office.  As this is a virtual visit, video technology does not allow for your provider to perform a traditional examination.  This may limit your provider's ability to fully assess your condition.  If your provider identifies any concerns that need to be evaluated in person or the need to arrange testing such as labs, EKG, etc, we will make arrangements to do so.    Although advances in technology are sophisticated, we cannot ensure that it will always work on either your end or our end.  If the connection with a video visit is poor, we may have to switch to a telephone visit.  With either a video or telephone visit, we are not always able to ensure that we have a secure connection.   I need to obtain your verbal consent now.   Are you willing to proceed with your visit today?   Bradley Howe has provided verbal consent on 10/04/2020 for a virtual visit (video or telephone).   Bradley Soho Sherronda Sweigert, PA-C 10/04/2020  3:10 PM   Date:  10/04/2020   ID:  Bradley Howe, DOB [birth year], MRN [id number]  Patient Location: Home Provider Location: Home Office   Participants: Patient and Provider for Visit and Wrap up  Method of visit: Video  Location of Patient: Home Location of Provider: Home Office Consent was obtain for visit over the video. Services rendered by provider: Visit was performed via video  A video enabled telemedicine application was used and I verified that I am speaking with the correct person using two  identifiers.  PCP:  Bradley Agreste, MD   Chief Complaint:  toothache  History of Present Illness:    Bradley Howe is a 55 y.o. male with history as stated below. Presents video telehealth for an acute care visit  Onset of symptoms was 2 weeks ago and symptoms have been persistent and include: dental pain in the left lower jaw     Pt reports he saw his dentist awhile back and was told that he had a cavity.  Pain is in the bottom left molar.  He reports it is not particularly swollen but he can feel a hole. Pt reports the pain got worse today.   He has been taking Aleve for pain.  Pt reports initially it was helping, but not now.  No fevers, facial swelling, difficulty swallowing or speaking.   Pt reports eating makes the pain worse.  Pt reports he called his dentist who has scheduled an appointment 2 weeks from today.  Denies having fevers, chills, shortness of breath, cough, chest pain, ear pain, sore throat or exposure to covid or other sick contacts.   No other aggravating or relieving factors.  No other c/o.  Past Medical, Surgical, Social History, Allergies, and Medications have been Reviewed.  Patient Active Problem List   Diagnosis Date Noted  . Obesity, unspecified 08/20/2019  . Ex-smoker 08/20/2019  . GERD (gastroesophageal reflux disease) 08/20/2019  .  Elevated LFTs 08/20/2019  . Angina pectoris ([place])   . Chest pain 09/17/2015  . Type 2 diabetes mellitus without complications ([place]) 92/03/9416  . Hypercholesterolemia 01/15/2013  . Renal insufficiency 07/26/2012  . Hyponatremia 07/26/2012  . Essential (primary) hypertension 07/26/2012  . Left inguinal hernia 05/19/2011    Social History   Tobacco Use  . Smoking status: Former Smoker    Packs/day: 0.10    Years: 25.00    Pack years: 2.50    Types: Cigarettes  . Smokeless tobacco: Never Used  . Tobacco comment: "quit smoking cigarettes in 2013"  Substance Use Topics  . Alcohol use: Yes    Comment: occ      Current Outpatient Medications:  .  penicillin v potassium (VEETID) 500 MG tablet, Take 1 tablet (500 mg total) by mouth 3 (three) times daily for 10 days., Disp: 30 tablet, Rfl: 0 .  Continuous Blood Gluc Sensor ([place]) MISC, Patient to use 1 system in a span of 14 days (Patient not taking: Reported on 03/03/2020), Disp: 1 each, Rfl: 1 .  cyclobenzaprine (FLEXERIL) 10 MG tablet, Take 1 tablet (10 mg total) by mouth 2 (two) times daily as needed for muscle spasms., Disp: 10 tablet, Rfl: 0 .  gabapentin (NEURONTIN) 100 MG capsule, Take 1 capsule (100 mg total) by mouth at bedtime., Disp: 90 capsule, Rfl: 1 .  glipiZIDE (GLUCOTROL) 5 MG tablet, Take 1 tablet (5 mg total) by mouth 2 (two) times daily., Disp: 90 tablet, Rfl: 1 .  insulin NPH-regular Human (70-30) 100 UNIT/ML injection, Inject 52 Units into the skin 2 (two) times daily with a meal., Disp: 10 mL, Rfl: 6 .  lisinopril (ZESTRIL) 10 MG tablet, Take 1 tablet (10 mg total) by mouth daily., Disp: 90 tablet, Rfl: 1 .  Na Sulfate-K Sulfate-Mg Sulf (SUPREP BOWEL PREP KIT) 17.5-3.13-1.6 GM/177ML SOLN, Take 1 kit by mouth as directed. For colonoscopy prep, Disp: 354 mL, Rfl: 0 .  omeprazole (PRILOSEC) 20 MG capsule, Take by mouth., Disp: , Rfl:  .  ondansetron (ZOFRAN ODT) 4 MG disintegrating tablet, Take 1 tablet (4 mg total) by mouth every 8 (eight) hours as needed for nausea or vomiting., Disp: 20 tablet, Rfl: 0 .  rosuvastatin (CRESTOR) 20 MG tablet, Take 1 tablet (20 mg total) by mouth daily., Disp: 90 tablet, Rfl: 1  Current Facility-Administered Medications:  .  0.9 %  sodium chloride infusion, 500 mL, Intravenous, Continuous, Milus Banister, MD   No Known Allergies   Review of Systems  Constitutional: Negative for chills and fever.  HENT: Negative for congestion, ear pain and sore throat.        Dental pain  Eyes: Negative for blurred vision and double vision.  Respiratory: Negative for cough,  shortness of breath and wheezing.   Cardiovascular: Negative for chest pain, palpitations and leg swelling.  Gastrointestinal: Negative for abdominal pain, diarrhea, nausea and vomiting.  Genitourinary: Negative for dysuria.  Musculoskeletal: Negative for myalgias.  Skin: Negative for rash.  Neurological: Negative for loss of consciousness, weakness and headaches.  Psychiatric/Behavioral: The patient is not nervous/anxious.    See HPI for history of present illness.  Physical Exam Constitutional:      General: He is not in acute distress.    Appearance: Normal appearance. He is well-developed. He is not ill-appearing.  HENT:     Head: Normocephalic and atraumatic.     Comments: No facial swelling, no change in voice, no drooling    Nose:  Nose normal.  Eyes:     General: No scleral icterus.    Conjunctiva/sclera: Conjunctivae normal.  Pulmonary:     Effort: Pulmonary effort is normal.  Musculoskeletal:        General: Normal range of motion.     Cervical back: Normal range of motion.  Skin:    Coloration: Skin is not pale.  Neurological:     General: No focal deficit present.     Mental Status: He is alert.  Psychiatric:        Mood and Affect: Mood normal.               A&P  1. Pain, dental  - known dental carrie with increasing pain, suspect dental infection.  Will give Penicillin. Encouraged symptom management with Aleve and Tylenol as needed and close follow-up with dentist.   Patient voiced understanding and agreement to plan.   Time:   Today, I have spent 15 minutes with the patient with telehealth technology discussing the above problems, reviewing the chart, previous notes, medications and orders.    Tests Ordered: No orders of the defined types were placed in this encounter.   Medication Changes: Meds ordered this encounter  Medications  . penicillin v potassium (VEETID) 500 MG tablet    Sig: Take 1 tablet (500 mg total) by mouth 3 (three) times daily  for 10 days.    Dispense:  30 tablet    Refill:  0     Disposition:  Follow up dentist at your appointment in 2 weeks  Signed, Abigail Butts, PA-C  10/04/2020 3:18 PM

## 2020-10-12 ENCOUNTER — Encounter: Payer: Self-pay | Admitting: Physician Assistant

## 2020-10-12 ENCOUNTER — Telehealth: Payer: Managed Care, Other (non HMO) | Admitting: Physician Assistant

## 2020-10-12 DIAGNOSIS — M5442 Lumbago with sciatica, left side: Secondary | ICD-10-CM | POA: Diagnosis not present

## 2020-10-12 DIAGNOSIS — M545 Low back pain, unspecified: Secondary | ICD-10-CM | POA: Diagnosis not present

## 2020-10-12 DIAGNOSIS — G8929 Other chronic pain: Secondary | ICD-10-CM

## 2020-10-12 MED ORDER — MELOXICAM 15 MG PO TABS: 15.0000 mg | ORAL_TABLET | Freq: Every day | ORAL | 0 refills | Status: DC

## 2020-10-12 MED ORDER — CYCLOBENZAPRINE HCL 10 MG PO TABS: 10.0000 mg | ORAL_TABLET | Freq: Three times a day (TID) | ORAL | 0 refills | Status: DC | PRN

## 2020-10-12 NOTE — Patient Instructions (Signed)
Instructions sent to patients MyChart.

## 2020-10-12 NOTE — Progress Notes (Signed)
Bradley Howe, Bradley Howe are scheduled for a virtual visit with your provider today.    Just as we do with appointments in the office, we must obtain your consent to participate.  Your consent will be active for this visit and any virtual visit you may have with one of our providers in the next 365 days.    If you have a MyChart account, I can also send a copy of this consent to you electronically.  All virtual visits are billed to your insurance company just like a traditional visit in the office.  As this is a virtual visit, video technology does not allow for your provider to perform a traditional examination.  This may limit your provider's ability to fully assess your condition.  If your provider identifies any concerns that need to be evaluated in person or the need to arrange testing such as labs, EKG, etc, we will make arrangements to do so.    Although advances in technology are sophisticated, we cannot ensure that it will always work on either your end or our end.  If the connection with a video visit is poor, we may have to switch to a telephone visit.  With either a video or telephone visit, we are not always able to ensure that we have a secure connection.   I need to obtain your verbal consent now.   Are you willing to proceed with your visit today?   Bradley Howe has provided verbal consent on 10/12/2020 for a virtual visit (video or telephone).  Leeanne Rio, PA-C 10/12/2020  7:56 AM    Virtual Visit via Video   I connected with patient on 10/12/20 at  8:00 AM EDT by a video enabled telemedicine application and verified that I am speaking with the correct person using two identifiers.  Location patient: Home Location provider: [place] participating in the virtual visit: Patient, Provider  I discussed the limitations of evaluation and management by telemedicine and the availability of in person appointments. The patient expressed understanding and agreed  to proceed.  Subjective:   HPI:  Patient presents via [facility] today complaining of a continued flare of left-sided low back pain over the past 2 weeks.  Patient with history of chronic low back pain, having infrequent flareups of symptoms.  Patient endorses significant heavy lifting at work.  Denies any known trauma or injury or any specific incident flaring up his symptoms.  Symptoms again starting 2 weeks ago with low back pain worse with rising from a seated position.  Sometimes having pain that radiates down his left lower extremity.  Denies numbness, tingling or weakness of extremity.  Denies saddle anesthesia or any change to bowel or bladder habits.  Patient went to be evaluated at [facility] urgent care on 09/20/2020 at which time exam was found to be benign.  Patient was started on combination of naproxen 500 mg twice daily and cyclobenzaprine 10 mg up to twice daily as needed.  Patient endorses taking medications as directed and tolerating well.  States these do help with his symptoms but symptoms did not fully resolve.  Is now out of medication with increase in pain over the past 24 to 36 hours.  Patient denies any other new or worsening symptoms.  Patient endorses having imaging many years ago and was told his symptoms are stemming from sciatica.  Denies any known history of herniated disc.  Denies any symptoms of right side of lower back.  Does note tightness  of muscles on his left lumbar area.  Would like to know what his next steps are in terms of figuring things out.   ROS:   See pertinent positives and negatives per HPI.  Patient Active Problem List   Diagnosis Date Noted  . Obesity, unspecified 08/20/2019  . Ex-smoker 08/20/2019  . GERD (gastroesophageal reflux disease) 08/20/2019  . Elevated LFTs 08/20/2019  . Angina pectoris ([place])   . Chest pain 09/17/2015  . Type 2 diabetes mellitus without complications ([place]) 00/76/2263  . Hypercholesterolemia 01/15/2013  . Renal insufficiency  07/26/2012  . Hyponatremia 07/26/2012  . Essential (primary) hypertension 07/26/2012  . Left inguinal hernia 05/19/2011    Social History   Tobacco Use  . Smoking status: Former Smoker    Packs/day: 0.10    Years: 25.00    Pack years: 2.50    Types: Cigarettes  . Smokeless tobacco: Never Used  . Tobacco comment: "quit smoking cigarettes in 2013"  Substance Use Topics  . Alcohol use: Yes    Comment: occ    Current Outpatient Medications:  .  Continuous Blood Gluc Sensor ([place]) MISC, Patient to use 1 system in a span of 14 days (Patient not taking: Reported on 03/03/2020), Disp: 1 each, Rfl: 1 .  cyclobenzaprine (FLEXERIL) 10 MG tablet, Take 1 tablet (10 mg total) by mouth 2 (two) times daily as needed for muscle spasms., Disp: 10 tablet, Rfl: 0 .  gabapentin (NEURONTIN) 100 MG capsule, Take 1 capsule (100 mg total) by mouth at bedtime., Disp: 90 capsule, Rfl: 1 .  glipiZIDE (GLUCOTROL) 5 MG tablet, Take 1 tablet (5 mg total) by mouth 2 (two) times daily., Disp: 90 tablet, Rfl: 1 .  insulin NPH-regular Human (70-30) 100 UNIT/ML injection, Inject 52 Units into the skin 2 (two) times daily with a meal., Disp: 10 mL, Rfl: 6 .  lisinopril (ZESTRIL) 10 MG tablet, Take 1 tablet (10 mg total) by mouth daily., Disp: 90 tablet, Rfl: 1 .  Na Sulfate-K Sulfate-Mg Sulf (SUPREP BOWEL PREP KIT) 17.5-3.13-1.6 GM/177ML SOLN, Take 1 kit by mouth as directed. For colonoscopy prep, Disp: 354 mL, Rfl: 0 .  omeprazole (PRILOSEC) 20 MG capsule, Take by mouth., Disp: , Rfl:  .  ondansetron (ZOFRAN ODT) 4 MG disintegrating tablet, Take 1 tablet (4 mg total) by mouth every 8 (eight) hours as needed for nausea or vomiting., Disp: 20 tablet, Rfl: 0 .  penicillin v potassium (VEETID) 500 MG tablet, Take 1 tablet (500 mg total) by mouth 3 (three) times daily for 10 days., Disp: 30 tablet, Rfl: 0 .  rosuvastatin (CRESTOR) 20 MG tablet, Take 1 tablet (20 mg total) by mouth daily., Disp: 90  tablet, Rfl: 1  Current Facility-Administered Medications:  .  0.9 %  sodium chloride infusion, 500 mL, Intravenous, Continuous, Milus Banister, MD  No Known Allergies  Objective:   There were no vitals taken for this visit.  Patient is well-developed, well-nourished in no acute distress.  Resting comfortably on couch at home.  Head is normocephalic, atraumatic.  No labored breathing.  Speech is clear and coherent with logical content.  Patient is alert and oriented at baseline.   Assessment and Plan:   1. Recurrent low back pain 2. Chronic left-sided low back pain with left-sided sciatica Giving continued symptoms and recurring flares of low back pain, needs updated imaging and further evaluation.  Patient endorses his primary care provider recently moved offices and he is not sure where he is  located.  States the office itself was closed so he is unable to see another provider there.  Has not been able to get in with a new primary care provider.  Discussed with patient that former PCP is now with [facility] in [place].  Contact information given so he can reach out to them for evaluation.  In the absence of alarm signs or symptoms, will Rx meloxicam 15 mg once daily.  Tylenol for breakthrough pain.  Refill Flexeril and increase dosing to 3 times daily as needed.  He is aware he is not to drive or operate heavy machinery when taking this medication.  Work restrictions given over the next week to help things calm down.  He is aware that primary care provider will would need to extend work restrictions further.  Discussed possibility of steroid treatment giving nerve involvement in his symptoms.  However he has history of uncontrolled diabetes and no A1c within the past 6 months.  Last A1c was 9.6.  Strict urgent care/ER precautions reviewed with patient.  Copy of instructions were provided to the patient via [place].  Copy of the note was sent to patient's primary care  provider.  - cyclobenzaprine (FLEXERIL) 10 MG tablet; Take 1 tablet (10 mg total) by mouth 3 (three) times daily as needed for muscle spasms.  Dispense: 30 tablet; Refill: 0 - meloxicam (MOBIC) 15 MG tablet; Take 1 tablet (15 mg total) by mouth daily.  Dispense: 15 tablet; Refill: 0    Leeanne Rio, [place] 10/12/2020

## 2020-10-13 ENCOUNTER — Encounter: Payer: Managed Care, Other (non HMO) | Admitting: Physician Assistant

## 2020-10-13 ENCOUNTER — Telehealth: Payer: Managed Care, Other (non HMO) | Admitting: Physician Assistant

## 2020-10-13 DIAGNOSIS — Z5329 Procedure and treatment not carried out because of patient's decision for other reasons: Secondary | ICD-10-CM

## 2020-10-13 DIAGNOSIS — Z91199 Patient's noncompliance with other medical treatment and regimen due to unspecified reason: Secondary | ICD-10-CM

## 2020-10-13 NOTE — Progress Notes (Signed)
Erroneous encounter -- needed work note changed. No charge.

## 2020-10-13 NOTE — Progress Notes (Signed)
   Complete physical exam  Patient: Bradley Howe   DOB: 03/04/2000   55 y.o. Male  MRN: [id number]  Subjective:    No chief complaint on file.   Bradley Howe is a 55 y.o. male who presents today for a complete physical exam. She reports consuming a {diet types:17450} diet. {types:19826} She generally feels {DESC; WELL/FAIRLY WELL/POORLY:18703}. She reports sleeping {DESC; WELL/FAIRLY WELL/POORLY:18703}. She {does/does not:200015} have additional problems to discuss today.    Most recent fall risk assessment:    11/09/2021   10:42 AM  Fall Risk   Falls in the past year? 0  Number falls in past yr: 0  Injury with Fall? 0  Risk for fall due to : No Fall Risks  Follow up Falls evaluation completed     Most recent depression screenings:    11/09/2021   10:42 AM 09/30/2020   10:46 AM  PHQ 2/9 Scores  PHQ - 2 Score 0 0  PHQ- 9 Score 5     {VISON DENTAL STD PSA (Optional):27386}  {History (Optional):23778}  Patient Care Team: Jessup, Joy, NP as PCP - General (Nurse Practitioner)   Outpatient Medications Prior to Visit  Medication Sig   fluticasone (FLONASE) 50 MCG/ACT nasal spray Place 2 sprays into both nostrils in the morning and at bedtime. After 7 days, reduce to once daily.   norgestimate-ethinyl estradiol (SPRINTEC 28) 0.25-35 MG-MCG tablet Take 1 tablet by mouth daily.   Nystatin POWD Apply liberally to affected area 2 times per day   spironolactone (ALDACTONE) 100 MG tablet Take 1 tablet (100 mg total) by mouth daily.   No facility-administered medications prior to visit.    ROS        Objective:     There were no vitals taken for this visit. {Vitals History (Optional):23777}  Physical Exam   No results found for any visits on 12/15/21. {Show previous labs (optional):23779}    Assessment & Plan:    Routine Health Maintenance and Physical Exam  Immunization History  Administered Date(s) Administered   DTaP 05/18/1999, 07/14/1999,  09/22/1999, 06/07/2000, 12/22/2003   Hepatitis A 10/18/2007, 10/23/2008   Hepatitis B 03/05/1999, 04/12/1999, 09/22/1999   HiB (PRP-OMP) 05/18/1999, 07/14/1999, 09/22/1999, 06/07/2000   IPV 05/18/1999, 07/14/1999, 03/12/2000, 12/22/2003   Influenza,inj,Quad PF,6+ Mos 01/23/2014   Influenza-Unspecified 04/24/2012   MMR 03/12/2001, 12/22/2003   Meningococcal Polysaccharide 10/23/2011   Pneumococcal Conjugate-13 06/07/2000   Pneumococcal-Unspecified 09/22/1999, 12/06/1999   Tdap 10/23/2011   Varicella 03/12/2000, 10/18/2007    Health Maintenance  Topic Date Due   HIV Screening  Never done   Hepatitis C Screening  Never done   INFLUENZA VACCINE  12/13/2021   PAP-Cervical Cytology Screening  12/15/2021 (Originally 03/03/2020)   PAP SMEAR-Modifier  12/15/2021 (Originally 03/03/2020)   TETANUS/TDAP  12/15/2021 (Originally 10/22/2021)   HPV VACCINES  Discontinued   COVID-19 Vaccine  Discontinued    Discussed health benefits of physical activity, and encouraged her to engage in regular exercise appropriate for her age and condition.  Problem List Items Addressed This Visit   None Visit Diagnoses     Annual physical exam    -  Primary   Cervical cancer screening       Need for Tdap vaccination          No follow-ups on file.     Joy Jessup, NP   

## 2020-11-04 ENCOUNTER — Telehealth: Payer: Self-pay

## 2020-11-04 ENCOUNTER — Telehealth: Payer: Self-pay | Admitting: Family Medicine

## 2020-11-04 ENCOUNTER — Other Ambulatory Visit: Payer: Self-pay

## 2020-11-04 MED ORDER — INSULIN NPH ISOPHANE & REGULAR (70-30) 100 UNIT/ML ~~LOC~~ SUSP: 52.0000 [IU] | Freq: Two times a day (BID) | SUBCUTANEOUS | 0 refills | Status: DC

## 2020-11-04 MED ORDER — INSULIN NPH ISOPHANE & REGULAR (70-30) 100 UNIT/ML ~~LOC~~ SUSP
52.0000 [IU] | Freq: Two times a day (BID) | SUBCUTANEOUS | 0 refills | Status: DC
Start: 2020-11-04 — End: 2020-12-09

## 2020-11-04 NOTE — Telephone Encounter (Signed)
It appears that 10 mL of 70/30 insulin was sent today.  Changed order to 20 mL, which should be sufficient until his upcoming appointment with me.  He is due for close follow-up as no diabetes follow-up since August 2021 and at that time I referred him to endocrinology (but no showed for that appointment, and letter was sent to schedule appointment by endocrinology).  Make sure he keeps follow-up appointment with me.

## 2020-11-04 NOTE — Telephone Encounter (Signed)
Medication sent to pharmacy  

## 2020-11-04 NOTE — Telephone Encounter (Signed)
Pt called in asking for a new script of the Novolin 70/30 to be sent into [facility] [address]   Pt has an appt with Carlota Raspberry on 11/17/20 but will be out before then.  Please advise

## 2020-11-04 NOTE — Telephone Encounter (Signed)
Pt was prescribed insulin NPH-regular Human (70-30) 100 UNIT/ML injection  And [facility] pharmacy called and it was writtent for 1valve and they are needing the RX to be written for 3 valves.   Call back [place]

## 2020-11-04 NOTE — Telephone Encounter (Signed)
LVM to schedule appt with Dr Nyoka Cowden 06/23

## 2020-11-17 ENCOUNTER — Encounter: Payer: Managed Care, Other (non HMO) | Admitting: Family Medicine

## 2020-12-01 IMAGING — US US ABDOMEN LIMITED
1 series · 14 of 25 positions shown · non-contrast
Comparison: Prior CT from 05/11/2011.

CLINICAL DATA: Initial evaluation for elevated LFTs.

EXAM:
ULTRASOUND ABDOMEN LIMITED RIGHT UPPER QUADRANT

[Series 1: us abdomen limited · 14 of 32 slices shown]
[im 1/32]
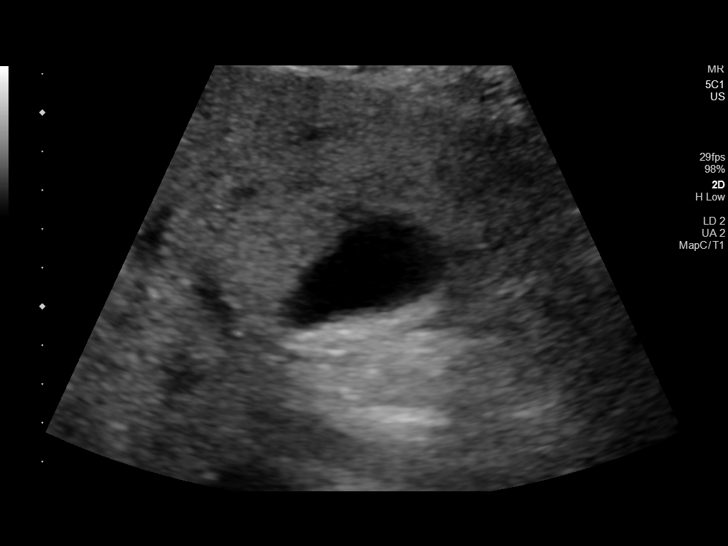
[im 3/32]
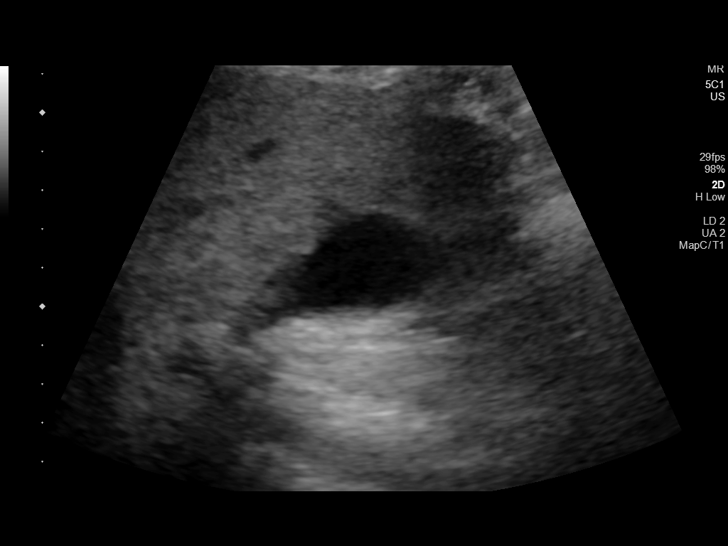
[im 6/32]
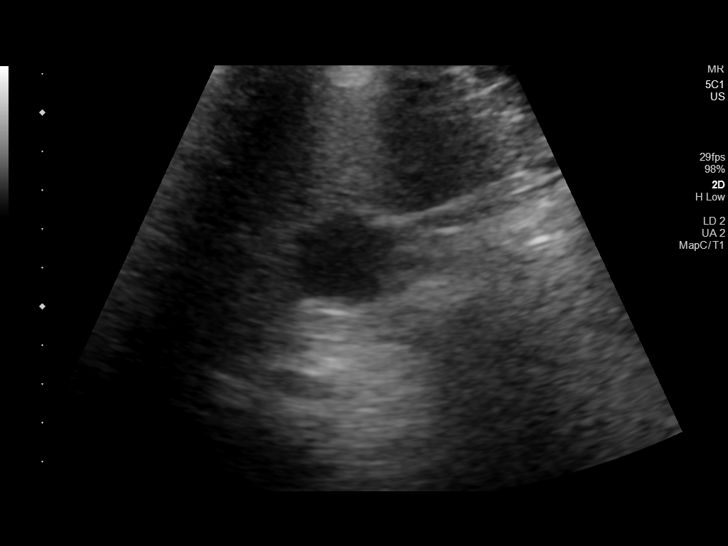
[im 8/32]
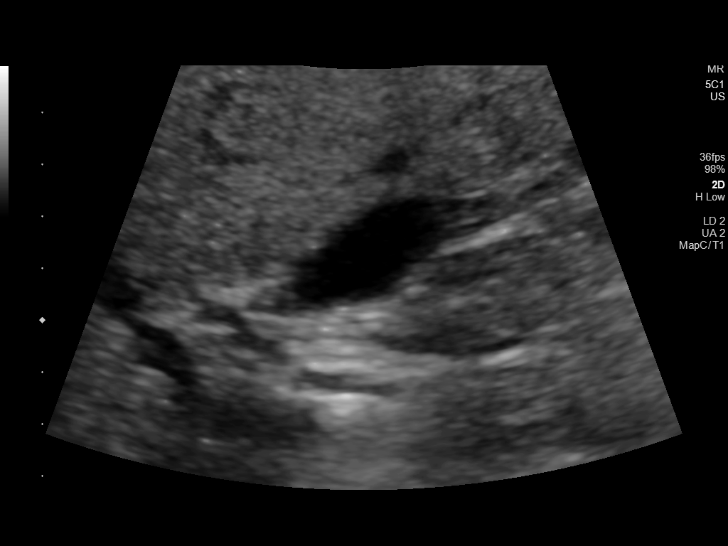
[im 11/32]
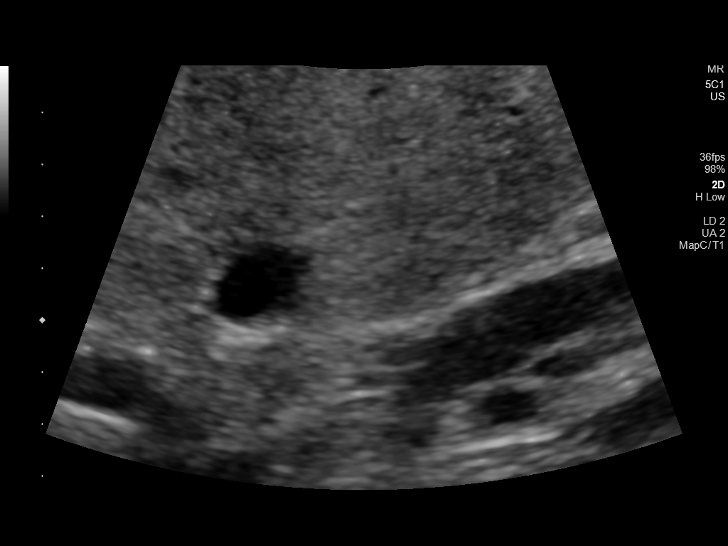
[im 12/32]
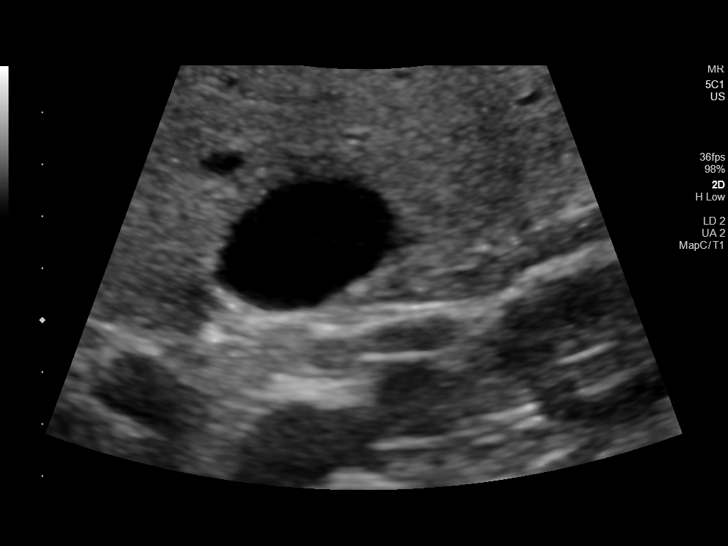
[im 15/32]
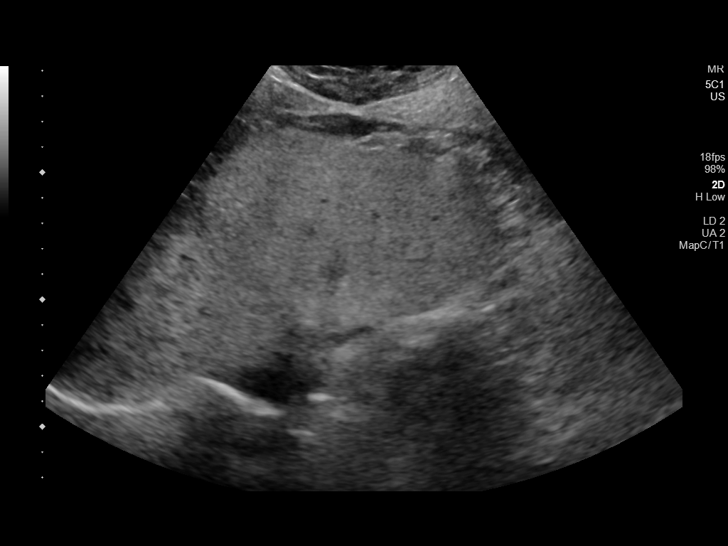
[im 17/32]
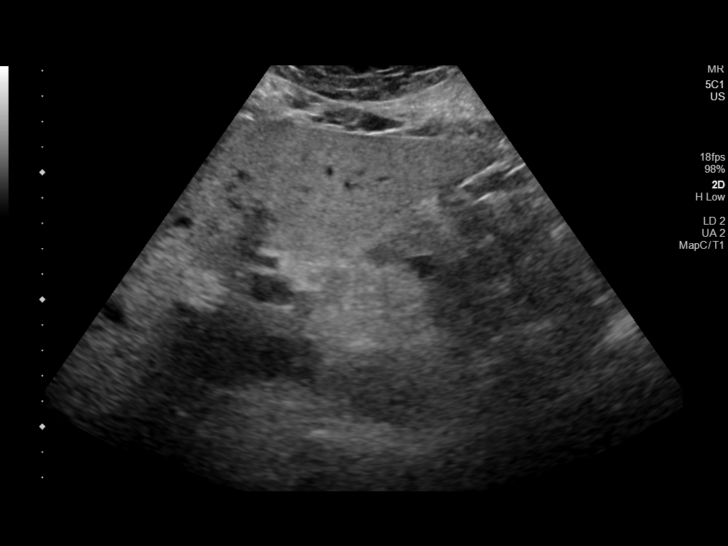
[im 20/32]
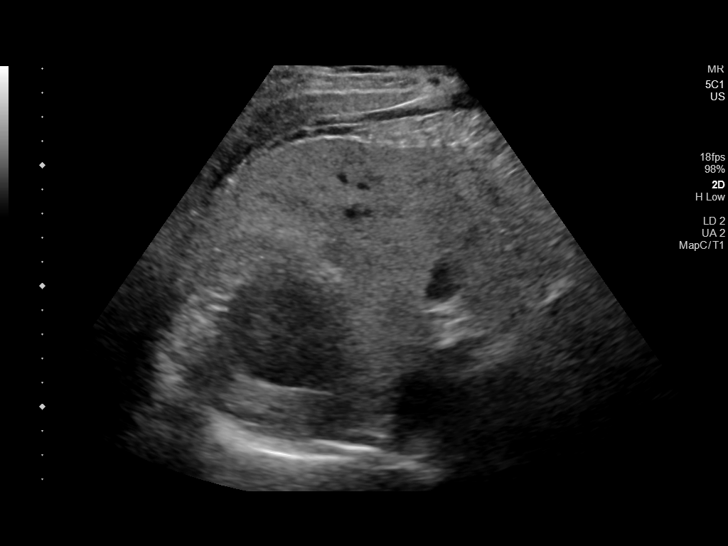
[im 21/32]
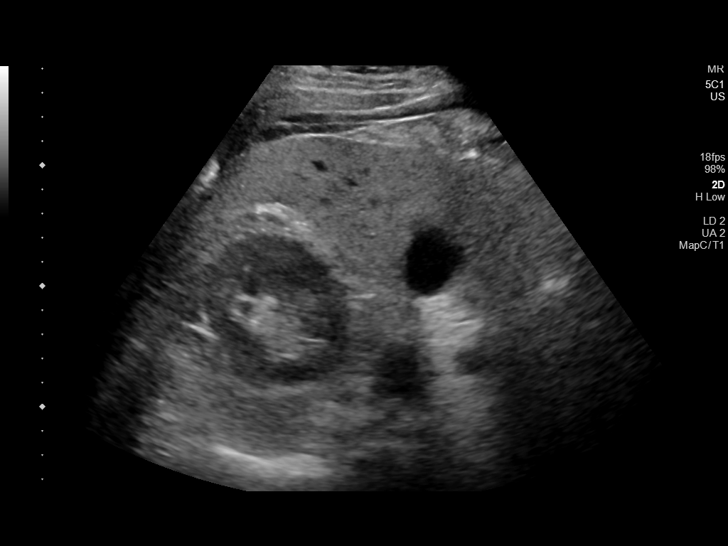
[im 24/32]
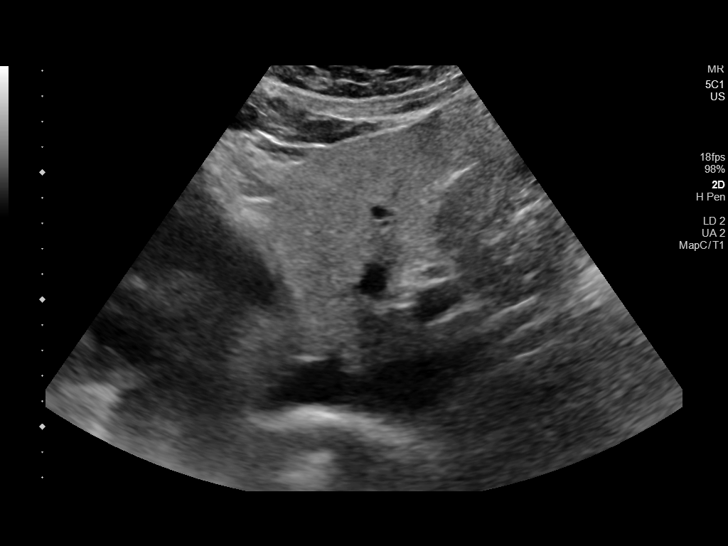
[im 26/32]
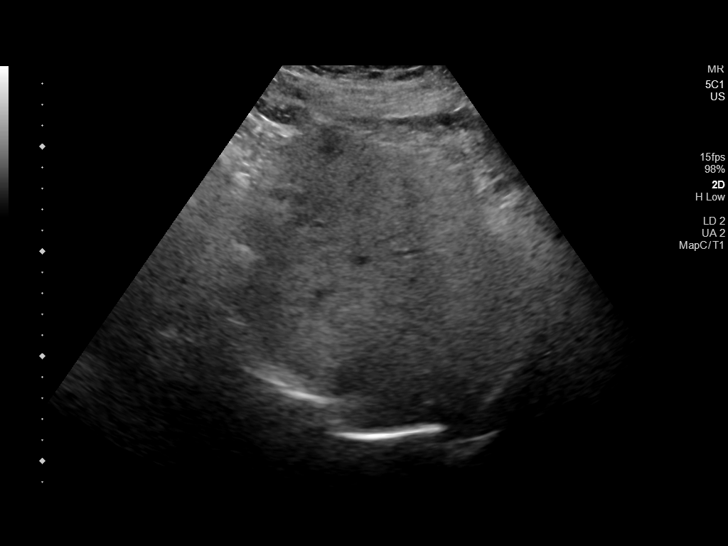
[im 29/32]
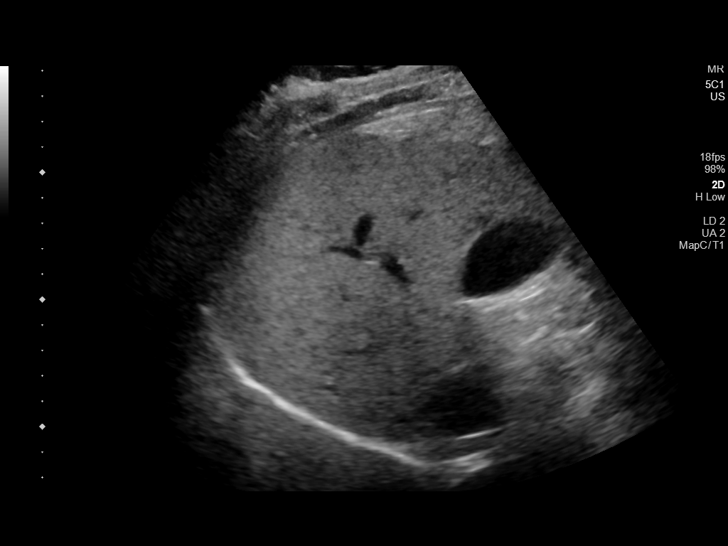
[im 32/32]
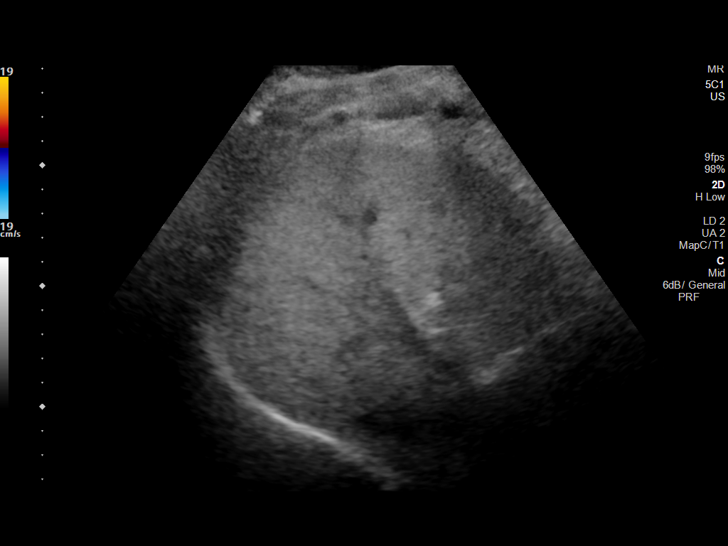

[14 of 25 positions shown; findings below may reference images not displayed]

FINDINGS: Gallbladder:

No gallstones or wall thickening visualized. No sonographic Murphy
sign noted by sonographer.

Common bile duct:

Diameter: 4.8 mm

Liver:

No focal lesion identified. Liver demonstrates a coarse echogenic
echotexture. Portal vein is patent on color Doppler imaging with
normal direction of blood flow towards the liver.

Other: None.
IMPRESSION: 1. Normal sonographic appearance of the gallbladder. No biliary
dilatation.
2. Coarse echogenic echotexture of the liver, suggesting steatosis
and/or other intrinsic hepatocellular disease.

## 2020-12-08 ENCOUNTER — Other Ambulatory Visit: Payer: Self-pay | Admitting: Family Medicine

## 2020-12-27 ENCOUNTER — Ambulatory Visit: Payer: Managed Care, Other (non HMO) | Admitting: Family Medicine

## 2021-01-10 ENCOUNTER — Other Ambulatory Visit: Payer: Self-pay

## 2021-01-10 ENCOUNTER — Ambulatory Visit (INDEPENDENT_AMBULATORY_CARE_PROVIDER_SITE_OTHER): Payer: Managed Care, Other (non HMO) | Admitting: Family Medicine

## 2021-01-10 ENCOUNTER — Encounter: Payer: Self-pay | Admitting: Family Medicine

## 2021-01-10 VITALS — BP 142/89 | HR 83 | Temp 98.1°F | Resp 16 | Ht 67.0 in | Wt 221.1 lb

## 2021-01-10 DIAGNOSIS — E785 Hyperlipidemia, unspecified: Secondary | ICD-10-CM

## 2021-01-10 DIAGNOSIS — E1165 Type 2 diabetes mellitus with hyperglycemia: Secondary | ICD-10-CM

## 2021-01-10 DIAGNOSIS — R3916 Straining to void: Secondary | ICD-10-CM

## 2021-01-10 DIAGNOSIS — Z794 Long term (current) use of insulin: Secondary | ICD-10-CM | POA: Diagnosis not present

## 2021-01-10 DIAGNOSIS — N401 Enlarged prostate with lower urinary tract symptoms: Secondary | ICD-10-CM | POA: Diagnosis not present

## 2021-01-10 DIAGNOSIS — I10 Essential (primary) hypertension: Secondary | ICD-10-CM

## 2021-01-10 DIAGNOSIS — K0889 Other specified disorders of teeth and supporting structures: Secondary | ICD-10-CM

## 2021-01-10 DIAGNOSIS — Z7689 Persons encountering health services in other specified circumstances: Secondary | ICD-10-CM

## 2021-01-10 LAB — POCT GLYCOSYLATED HEMOGLOBIN (HGB A1C): Hemoglobin A1C: 11 % — AB (ref 4.0–5.6)

## 2021-01-10 MED ORDER — TAMSULOSIN HCL 0.4 MG PO CAPS: 0.4000 mg | ORAL_CAPSULE | Freq: Every day | ORAL | 0 refills | Status: DC

## 2021-01-10 MED ORDER — AMOXICILLIN-POT CLAVULANATE 875-125 MG PO TABS
1.0000 | ORAL_TABLET | Freq: Two times a day (BID) | ORAL | 0 refills | Status: DC
Start: 2021-01-10 — End: 2021-03-04

## 2021-01-10 MED ORDER — ROSUVASTATIN CALCIUM 20 MG PO TABS: 20.0000 mg | ORAL_TABLET | Freq: Every day | ORAL | 0 refills | Status: DC

## 2021-01-10 MED ORDER — GABAPENTIN 300 MG PO CAPS: 300.0000 mg | ORAL_CAPSULE | Freq: Every day | ORAL | 0 refills | Status: DC

## 2021-01-10 MED ORDER — LISINOPRIL 10 MG PO TABS: 10.0000 mg | ORAL_TABLET | Freq: Every day | ORAL | 1 refills | Status: DC

## 2021-01-10 MED ORDER — HUMULIN 70/30 (70-30) 100 UNIT/ML ~~LOC~~ SUSP: SUBCUTANEOUS | 0 refills | Status: DC

## 2021-01-10 NOTE — Progress Notes (Signed)
New Patient Office Visit  Subjective:  Patient ID: Bradley Howe, male    DOB: 06/05/1965  Age: 55 y.o. MRN: [id number]  CC:  Chief Complaint  Patient presents with   Establish Care   Dental Pain   Diabetes    HPI Bradley Howe presents for to establish care. Patient reports that he has diabetes and hypertension. He has run out of his meds and has not had his insulin in awhile. He also reports some difficulty when urinating. Denies dysuria. Sx have been for several months and progressing.   Past Medical History:  Diagnosis Date   GERD (gastroesophageal reflux disease)    High cholesterol    Hypertension    Type II diabetes mellitus (HCC)     Past Surgical History:  Procedure Laterality Date   INGUINAL HERNIA REPAIR Left 2013    Family History  Problem Relation Age of Onset   Hypertension Mother    Hypertension Father    Diabetes Paternal Grandmother    Heart disease Neg Hx    Colon cancer Neg Hx    Pancreatic cancer Neg Hx    Esophageal cancer Neg Hx    Stomach cancer Neg Hx    Rectal cancer Neg Hx     Social History   Socioeconomic History   Marital status: Married    Spouse name: Not on file   Number of children: Not on file   Years of education: Not on file   Highest education level: Not on file  Occupational History   Not on file  Tobacco Use   Smoking status: Former    Packs/day: 0.10    Years: 25.00    Pack years: 2.50    Types: Cigarettes   Smokeless tobacco: Never   Tobacco comments:    "quit smoking cigarettes in 2013"  Substance and Sexual Activity   Alcohol use: Yes    Comment: occ   Drug use: No   Sexual activity: Yes  Other Topics Concern   Not on file  Social History Narrative   Not on file   Social Determinants of Health   Financial Resource Strain: Not on file  Food Insecurity: Not on file  Transportation Needs: Not on file  Physical Activity: Not on file  Stress: Not on file  Social Connections: Not on file  Intimate  Partner Violence: Not on file    ROS Review of Systems  HENT:  Positive for dental problem.   Endocrine: Negative for polydipsia, polyphagia and polyuria.  Genitourinary:  Positive for difficulty urinating. Negative for dysuria.  All other systems reviewed and are negative.  Objective:   Today's Vitals: BP (!) 142/89 (BP Location: Left Arm, Patient Position: Sitting, Cuff Size: Large)   Pulse 83   Temp 98.1 F (36.7 C) (Oral)   Resp 16   Ht 5' 7"  (1.702 m)   Wt 221 lb 1.3 oz (100.3 kg)   SpO2 95%   BMI 34.63 kg/m   Physical Exam Vitals and nursing note reviewed.  Constitutional:      General: He is not in acute distress. Cardiovascular:     Rate and Rhythm: Normal rate and regular rhythm.  Pulmonary:     Effort: Pulmonary effort is normal.     Breath sounds: Normal breath sounds.  Abdominal:     Palpations: Abdomen is soft.     Tenderness: There is no abdominal tenderness.  Musculoskeletal:     Right lower leg: No edema.     Left  lower leg: No edema.  Neurological:     General: No focal deficit present.     Mental Status: He is alert and oriented to person, place, and time.    Assessment & Plan:   1. Type 2 diabetes mellitus with hyperglycemia, with long-term current use of insulin (HCC) Elevated A1c above goal. Discussed compliance. Meds refilled. Labs ordered. Patient referred to [facility] for further management of agents.   - POCT glycosylated hemoglobin (Hb A1C) - CMP14+EGFR - Lipid Panel - lisinopril (ZESTRIL) 10 MG tablet; Take 1 tablet (10 mg total) by mouth daily.  Dispense: 90 tablet; Refill: 1 - insulin NPH-regular Human (HUMULIN 70/30) (70-30) 100 UNIT/ML injection; INJECT 52 UNITS SUBCUTANEOUSLY TWICE DAILY WITH A MEAL  Dispense: 20 mL; Refill: 0 - gabapentin (NEURONTIN) 300 MG capsule; Take 1 capsule (300 mg total) by mouth at bedtime.  Dispense: 90 capsule; Refill: 0  2. Essential hypertension Elevated reading - discussed compliance - meds refilled -  CMP14+EGFR - Lipid Panel  3. Hyperlipidemia, unspecified hyperlipidemia type Meds refilled  - CMP14+EGFR - Lipid Panel - rosuvastatin (CRESTOR) 20 MG tablet; Take 1 tablet (20 mg total) by mouth daily.  Dispense: 90 tablet; Refill: 0  4. Benign prostatic hyperplasia (BPH) with straining on urination Flomax prescribed - consider urology referral if sx persist or worsen  - tamsulosin (FLOMAX) 0.4 MG CAPS capsule; Take 1 capsule (0.4 mg total) by mouth daily.  Dispense: 90 capsule; Refill: 0  5. Pain, dental Augmentin prescribed - patient to follow up with dental professional  - amoxicillin-clavulanate (AUGMENTIN) 875-125 MG tablet; Take 1 tablet by mouth 2 (two) times daily.  Dispense: 20 tablet; Refill: 0  6. Encounter to establish care     Outpatient Encounter Medications as of 01/10/2021  Medication Sig   amoxicillin-clavulanate (AUGMENTIN) 875-125 MG tablet Take 1 tablet by mouth 2 (two) times daily.   cyclobenzaprine (FLEXERIL) 10 MG tablet Take 1 tablet (10 mg total) by mouth 3 (three) times daily as needed for muscle spasms.   gabapentin (NEURONTIN) 100 MG capsule Take 1 capsule (100 mg total) by mouth at bedtime.   glipiZIDE (GLUCOTROL) 5 MG tablet Take 1 tablet (5 mg total) by mouth 2 (two) times daily.   HUMULIN 70/30 (70-30) 100 UNIT/ML injection INJECT 52 UNITS SUBCUTANEOUSLY TWICE DAILY WITH A MEAL   lisinopril (ZESTRIL) 10 MG tablet Take 1 tablet (10 mg total) by mouth daily.   meloxicam (MOBIC) 15 MG tablet Take 1 tablet (15 mg total) by mouth daily.   omeprazole (PRILOSEC) 20 MG capsule Take by mouth.   ondansetron (ZOFRAN ODT) 4 MG disintegrating tablet Take 1 tablet (4 mg total) by mouth every 8 (eight) hours as needed for nausea or vomiting.   rosuvastatin (CRESTOR) 20 MG tablet Take 1 tablet (20 mg total) by mouth daily.   tamsulosin (FLOMAX) 0.4 MG CAPS capsule Take 1 capsule (0.4 mg total) by mouth daily.   Continuous Blood Gluc Sensor ([place]) MISC Patient to use 1 system in a span of 14 days (Patient not taking: No sig reported)   Na Sulfate-K Sulfate-Mg Sulf (SUPREP BOWEL PREP KIT) 17.5-3.13-1.6 GM/177ML SOLN Take 1 kit by mouth as directed. For colonoscopy prep (Patient not taking: Reported on 01/10/2021)   Facility-Administered Encounter Medications as of 01/10/2021  Medication   0.9 %  sodium chloride infusion    Follow-up: Return in about 3 months (around 04/12/2021) for follow up, chronic med issues.   Becky Sax, MD

## 2021-01-10 NOTE — Progress Notes (Signed)
Patient is here to est care with provider. Patient is concerned about his slow urination. Patient would like to talk with provider about his tooth pain.

## 2021-01-11 LAB — LIPID PANEL
Chol/HDL Ratio: 6.8 ratio — ABNORMAL HIGH (ref 0.0–5.0)
Cholesterol, Total: 203 mg/dL — ABNORMAL HIGH (ref 100–199)
HDL: 30 mg/dL — ABNORMAL LOW (ref >39)
LDL Chol Calc (NIH): 100 mg/dL — ABNORMAL HIGH (ref 0–99)
Triglycerides: 431 mg/dL — ABNORMAL HIGH (ref 0–149)
VLDL Cholesterol Cal: 73 mg/dL — ABNORMAL HIGH (ref 5–40)

## 2021-01-11 LAB — CMP14+EGFR
ALT: 27 IU/L (ref 0–44)
AST: 22 IU/L (ref 0–40)
Albumin/Globulin Ratio: 1.6 (ref 1.2–2.2)
Albumin: 4.3 g/dL (ref 3.8–4.9)
Alkaline Phosphatase: 71 IU/L (ref 44–121)
BUN/Creatinine Ratio: 9 (ref 9–20)
BUN: 8 mg/dL (ref 6–24)
Bilirubin Total: 0.5 mg/dL (ref 0.0–1.2)
CO2: 20 mmol/L (ref 20–29)
Calcium: 9.5 mg/dL (ref 8.7–10.2)
Chloride: 103 mmol/L (ref 96–106)
Creatinine, Ser: 0.93 mg/dL (ref 0.76–1.27)
Globulin, Total: 2.7 g/dL (ref 1.5–4.5)
Glucose: 148 mg/dL — ABNORMAL HIGH (ref 65–99)
Potassium: 3.8 mmol/L (ref 3.5–5.2)
Sodium: 138 mmol/L (ref 134–144)
Total Protein: 7 g/dL (ref 6.0–8.5)
eGFR: 96 mL/min/{1.73_m2} (ref >59)

## 2021-02-15 ENCOUNTER — Other Ambulatory Visit: Payer: Self-pay | Admitting: Family Medicine

## 2021-02-15 DIAGNOSIS — Z794 Long term (current) use of insulin: Secondary | ICD-10-CM

## 2021-02-15 DIAGNOSIS — E1165 Type 2 diabetes mellitus with hyperglycemia: Secondary | ICD-10-CM

## 2021-03-04 ENCOUNTER — Telehealth: Payer: Managed Care, Other (non HMO) | Admitting: Physician Assistant

## 2021-03-04 DIAGNOSIS — K0889 Other specified disorders of teeth and supporting structures: Secondary | ICD-10-CM

## 2021-03-04 MED ORDER — AMOXICILLIN 500 MG PO CAPS: 500.0000 mg | ORAL_CAPSULE | Freq: Three times a day (TID) | ORAL | 0 refills | Status: AC

## 2021-03-04 NOTE — Patient Instructions (Signed)
Bradley Howe, thank you for joining Bradley Daring, PA-C for today's virtual visit.  While this provider is not your primary care provider (PCP), if your PCP is located in our provider database this encounter information will be shared with them immediately following your visit.  Consent: (Patient) Bradley Howe provided verbal consent for this virtual visit at the beginning of the encounter.  Current Medications:  Current Outpatient Medications:    amoxicillin (AMOXIL) 500 MG capsule, Take 1 capsule (500 mg total) by mouth 3 (three) times daily for 10 days., Disp: 30 capsule, Rfl: 0   Continuous Blood Gluc Sensor ([place]) MISC, Patient to use 1 system in a span of 14 days (Patient not taking: No sig reported), Disp: 1 each, Rfl: 1   cyclobenzaprine (FLEXERIL) 10 MG tablet, Take 1 tablet (10 mg total) by mouth 3 (three) times daily as needed for muscle spasms., Disp: 30 tablet, Rfl: 0   gabapentin (NEURONTIN) 300 MG capsule, Take 1 capsule (300 mg total) by mouth at bedtime., Disp: 90 capsule, Rfl: 0   glipiZIDE (GLUCOTROL) 5 MG tablet, Take 1 tablet (5 mg total) by mouth 2 (two) times daily., Disp: 90 tablet, Rfl: 1   HUMULIN 70/30 (70-30) 100 UNIT/ML injection, INJECT 52 UNITS SUBCUTANEOUSLY TWICE DAILY WITH A MEAL, Disp: 20 mL, Rfl: 0   lisinopril (ZESTRIL) 10 MG tablet, Take 1 tablet (10 mg total) by mouth daily., Disp: 90 tablet, Rfl: 1   meloxicam (MOBIC) 15 MG tablet, Take 1 tablet (15 mg total) by mouth daily., Disp: 15 tablet, Rfl: 0   Na Sulfate-K Sulfate-Mg Sulf (SUPREP BOWEL PREP KIT) 17.5-3.13-1.6 GM/177ML SOLN, Take 1 kit by mouth as directed. For colonoscopy prep (Patient not taking: Reported on 01/10/2021), Disp: 354 mL, Rfl: 0   omeprazole (PRILOSEC) 20 MG capsule, Take by mouth., Disp: , Rfl:    ondansetron (ZOFRAN ODT) 4 MG disintegrating tablet, Take 1 tablet (4 mg total) by mouth every 8 (eight) hours as needed for nausea or vomiting., Disp: 20  tablet, Rfl: 0   rosuvastatin (CRESTOR) 20 MG tablet, Take 1 tablet (20 mg total) by mouth daily., Disp: 90 tablet, Rfl: 0   tamsulosin (FLOMAX) 0.4 MG CAPS capsule, Take 1 capsule (0.4 mg total) by mouth daily., Disp: 90 capsule, Rfl: 0  Current Facility-Administered Medications:    0.9 %  sodium chloride infusion, 500 mL, Intravenous, Continuous, Milus Banister, MD   Medications ordered in this encounter:  Meds ordered this encounter  Medications   amoxicillin (AMOXIL) 500 MG capsule    Sig: Take 1 capsule (500 mg total) by mouth 3 (three) times daily for 10 days.    Dispense:  30 capsule    Refill:  0    Order Specific Question:   Supervising Provider    Answer:   Sabra Heck, [place]     *If you need refills on other medications prior to your next appointment, please contact your pharmacy*  Follow-Up: Call back or seek an in-person evaluation if the symptoms worsen or if the condition fails to improve as anticipated.  Other Instructions Dental Pain Dental pain is often a sign that something is wrong with your teeth or gums. You can also have pain after a dental treatment. If you have dental pain, it is important to contact your dentist, especially if the cause of the pain is not known. Dental pain may hurt a lot or a little and can be caused by many things, including: Tooth decay (  cavities or caries). Infection. The inner part of the tooth being filled with pus (an abscess). Injury. A crack in the tooth. Gums that move back and expose the root of a tooth. Gum disease. Abnormal grinding or clenching of teeth. Not taking good care of your teeth. Sometimes the cause of pain is not known. You may have pain all the time, or it may happen only when you are: Chewing. Exposed to hot or cold temperatures. Eating or drinking foods or drinks that have a lot of sugar in them, such as soda or candy. Follow these instructions at home: Medicines Take over-the-counter and prescription  medicines only as told by your dentist. If you were prescribed an antibiotic medicine, take it as told by your dentist. Do not stop taking it even if you start to feel better. Eating and drinking Do not eat foods or drinks that cause you pain. These include: Very hot or very cold foods or drinks. Sweet or sugary foods or drinks. Managing pain and swelling  If told, put ice on the painful area of your face. To do this: Put ice in a plastic bag. Place a towel between your skin and the bag. Leave the ice on for 20 minutes, 2-3 times a day. Take off the ice if your skin turns bright red. This is very important. If you cannot feel pain, heat, or cold, you have a greater risk of damage to the area. Brushing your teeth Brush your teeth twice a day using a fluoride toothpaste. Use a toothpaste made for sensitive teeth as told by your dentist. Use a soft toothbrush. General instructions Floss your teeth at least once a day. Do not put heat on the outside of your face. Rinse your mouth often with salt water. To make salt water, dissolve -1 tsp (3-6 g) of salt in 1 cup (237 mL) of warm water. Watch your dental pain. Let your dentist know if there are any changes. Keep all follow-up visits. Contact a dentist if: You have dental pain and you do not know why. Medicine does not help your pain. Your symptoms get worse. You have new symptoms. Get help right away if: You cannot open your mouth. You are having trouble breathing or swallowing. You have a fever. Your face, neck, or jaw is swollen. These symptoms may be an emergency. Get help right away. Call your local emergency services (911 in the [place] Do not wait to see if the symptoms will go away. Do not drive yourself to the hospital. Summary Dental pain may be caused by many things, including tooth decay, injury, or infection. In some cases, the cause is not known. Dental pain may hurt a lot or very little. You may have pain all the time,  or you may have it only when you eat or drink. Take over-the-counter and prescription medicines only as told by your dentist. Watch your dental pain for any changes. Let your dentist know if symptoms get worse. This information is not intended to replace advice given to you by your health care provider. Make sure you discuss any questions you have with your health care provider. Document Revised: 02/04/2020 Document Reviewed: 02/04/2020 Elsevier Patient Education  2022 [employer].    If you have been instructed to have an in-person evaluation today at a local Urgent Care facility, please use the link below. It will take you to a list of all of our available [facility], including address, phone number and hours of operation.  Please do not delay care.  [facility]  If you or a family member do not have a primary care provider, use the link below to schedule a visit and establish care. When you choose a [facility] primary care physician or advanced practice provider, you gain a long-term partner in health. Find a Primary Care Provider  Learn more about 's in-office and virtual care options: [facility]

## 2021-03-04 NOTE — Progress Notes (Signed)
Virtual Visit Consent   Bradley Howe, you are scheduled for a virtual visit with a [facility] provider today.     Just as with appointments in the office, your consent must be obtained to participate.  Your consent will be active for this visit and any virtual visit you may have with one of our providers in the next 365 days.     If you have a MyChart account, a copy of this consent can be sent to you electronically.  All virtual visits are billed to your insurance company just like a traditional visit in the office.    As this is a virtual visit, video technology does not allow for your provider to perform a traditional examination.  This may limit your provider's ability to fully assess your condition.  If your provider identifies any concerns that need to be evaluated in person or the need to arrange testing (such as labs, EKG, etc.), we will make arrangements to do so.     Although advances in technology are sophisticated, we cannot ensure that it will always work on either your end or our end.  If the connection with a video visit is poor, the visit may have to be switched to a telephone visit.  With either a video or telephone visit, we are not always able to ensure that we have a secure connection.     I need to obtain your verbal consent now.   Are you willing to proceed with your visit today?    Bradley Howe has provided verbal consent on 03/04/2021 for a virtual visit (video or telephone).   Mar Daring, PA-C   Date: 03/04/2021 3:02 PM   Virtual Visit via Video Note   I, Mar Daring, connected with  Bradley Howe  ([id number], 11-27-1965) on 03/04/21 at  3:00 PM EDT by a video-enabled telemedicine application and verified that I am speaking with the correct person using two identifiers.  Location: Patient: Virtual Visit Location Patient: Home Provider: Virtual Visit Location Provider: Home Office   I discussed the limitations of evaluation and management by  telemedicine and the availability of in person appointments. The patient expressed understanding and agreed to proceed.    History of Present Illness: Bradley Howe is a 55 y.o. who identifies as a male who was assigned male at birth, and is being seen today for dental pain.  HPI: Dental Pain  This is a new problem. The current episode started in the past 7 days (5 days ago). The problem occurs constantly. The problem has been gradually worsening. The pain is moderate. Associated symptoms include facial pain and thermal sensitivity (cold). Pertinent negatives include no difficulty swallowing, fever or sinus pressure. Treatments tried: ibuprofen. The treatment provided no relief.   Problems:  Patient Active Problem List   Diagnosis Date Noted   Obesity, unspecified 08/20/2019   Ex-smoker 08/20/2019   GERD (gastroesophageal reflux disease) 08/20/2019   Elevated LFTs 08/20/2019   Angina pectoris ([place])    Chest pain 09/17/2015   Type 2 diabetes mellitus without complications ([place]) 40/02/2724   Hypercholesterolemia 01/15/2013   Renal insufficiency 07/26/2012   Hyponatremia 07/26/2012   Essential (primary) hypertension 07/26/2012   Left inguinal hernia 05/19/2011    Allergies: No Known Allergies Medications:  Current Outpatient Medications:    amoxicillin-clavulanate (AUGMENTIN) 875-125 MG tablet, Take 1 tablet by mouth 2 (two) times daily., Disp: 20 tablet, Rfl: 0   Continuous Blood Gluc Sensor ([place]) MISC, Patient to  use 1 system in a span of 14 days (Patient not taking: No sig reported), Disp: 1 each, Rfl: 1   cyclobenzaprine (FLEXERIL) 10 MG tablet, Take 1 tablet (10 mg total) by mouth 3 (three) times daily as needed for muscle spasms., Disp: 30 tablet, Rfl: 0   gabapentin (NEURONTIN) 300 MG capsule, Take 1 capsule (300 mg total) by mouth at bedtime., Disp: 90 capsule, Rfl: 0   glipiZIDE (GLUCOTROL) 5 MG tablet, Take 1 tablet (5 mg total) by mouth 2 (two) times  daily., Disp: 90 tablet, Rfl: 1   HUMULIN 70/30 (70-30) 100 UNIT/ML injection, INJECT 52 UNITS SUBCUTANEOUSLY TWICE DAILY WITH A MEAL, Disp: 20 mL, Rfl: 0   lisinopril (ZESTRIL) 10 MG tablet, Take 1 tablet (10 mg total) by mouth daily., Disp: 90 tablet, Rfl: 1   meloxicam (MOBIC) 15 MG tablet, Take 1 tablet (15 mg total) by mouth daily., Disp: 15 tablet, Rfl: 0   Na Sulfate-K Sulfate-Mg Sulf (SUPREP BOWEL PREP KIT) 17.5-3.13-1.6 GM/177ML SOLN, Take 1 kit by mouth as directed. For colonoscopy prep (Patient not taking: Reported on 01/10/2021), Disp: 354 mL, Rfl: 0   omeprazole (PRILOSEC) 20 MG capsule, Take by mouth., Disp: , Rfl:    ondansetron (ZOFRAN ODT) 4 MG disintegrating tablet, Take 1 tablet (4 mg total) by mouth every 8 (eight) hours as needed for nausea or vomiting., Disp: 20 tablet, Rfl: 0   rosuvastatin (CRESTOR) 20 MG tablet, Take 1 tablet (20 mg total) by mouth daily., Disp: 90 tablet, Rfl: 0   tamsulosin (FLOMAX) 0.4 MG CAPS capsule, Take 1 capsule (0.4 mg total) by mouth daily., Disp: 90 capsule, Rfl: 0  Current Facility-Administered Medications:    0.9 %  sodium chloride infusion, 500 mL, Intravenous, Continuous, Milus Banister, MD  Observations/Objective: Patient is well-developed, well-nourished in no acute distress.  Resting comfortably at home.  Head is normocephalic, atraumatic.  No labored breathing.  Speech is clear and coherent with logical content.  Patient is alert and oriented at baseline.  Left cheek is slightly swollen compared to right  Assessment and Plan: There are no diagnoses linked to this encounter.  - Will treat with amoxicillin - Room temperature foods - Ice to left cheek as needed for swelling - Tylenol and ibuprofen PRN for pain - Follow up with dentist  Follow Up Instructions: I discussed the assessment and treatment plan with the patient. The patient was provided an opportunity to ask questions and all were answered. The patient agreed with  the plan and demonstrated an understanding of the instructions.  A copy of instructions were sent to the patient via MyChart unless otherwise noted below.    The patient was advised to call back or seek an in-person evaluation if the symptoms worsen or if the condition fails to improve as anticipated.  Time:  I spent 12 minutes with the patient via telehealth technology discussing the above problems/concerns.    Mar Daring, PA-C

## 2021-04-02 ENCOUNTER — Telehealth: Payer: Managed Care, Other (non HMO) | Admitting: Nurse Practitioner

## 2021-04-02 DIAGNOSIS — K0889 Other specified disorders of teeth and supporting structures: Secondary | ICD-10-CM | POA: Diagnosis not present

## 2021-04-02 MED ORDER — AMOXICILLIN 500 MG PO CAPS: 500.0000 mg | ORAL_CAPSULE | Freq: Three times a day (TID) | ORAL | 0 refills | Status: AC

## 2021-04-02 MED ORDER — IBUPROFEN 600 MG PO TABS: 600.0000 mg | ORAL_TABLET | Freq: Three times a day (TID) | ORAL | 0 refills | Status: DC | PRN

## 2021-04-02 MED ORDER — CHLORHEXIDINE GLUCONATE 0.12 % MT SOLN: 15.0000 mL | Freq: Two times a day (BID) | OROMUCOSAL | 0 refills | Status: DC

## 2021-04-02 NOTE — Patient Instructions (Signed)
Bradley Howe, thank you for joining Gildardo Pounds, NP for today's virtual visit.  While this provider is not your primary care provider (PCP), if your PCP is located in our provider database this encounter information will be shared with them immediately following your visit.  PLEASE SEE YOUR DENTIST AS SOON AS POSSIBLE  Consent: (Patient) Bradley Howe provided verbal consent for this virtual visit at the beginning of the encounter.  Current Medications:  Current Outpatient Medications:    amoxicillin (AMOXIL) 500 MG capsule, Take 1 capsule (500 mg total) by mouth 3 (three) times daily for 10 days., Disp: 30 capsule, Rfl: 0   chlorhexidine (PERIDEX) 0.12 % solution, Use as directed 15 mLs in the mouth or throat 2 (two) times daily., Disp: 473 mL, Rfl: 0   ibuprofen (ADVIL) 600 MG tablet, Take 1 tablet (600 mg total) by mouth every 8 (eight) hours as needed., Disp: 30 tablet, Rfl: 0   Continuous Blood Gluc Sensor ([place]) MISC, Patient to use 1 system in a span of 14 days (Patient not taking: No sig reported), Disp: 1 each, Rfl: 1   cyclobenzaprine (FLEXERIL) 10 MG tablet, Take 1 tablet (10 mg total) by mouth 3 (three) times daily as needed for muscle spasms., Disp: 30 tablet, Rfl: 0   gabapentin (NEURONTIN) 300 MG capsule, Take 1 capsule (300 mg total) by mouth at bedtime., Disp: 90 capsule, Rfl: 0   glipiZIDE (GLUCOTROL) 5 MG tablet, Take 1 tablet (5 mg total) by mouth 2 (two) times daily., Disp: 90 tablet, Rfl: 1   HUMULIN 70/30 (70-30) 100 UNIT/ML injection, INJECT 52 UNITS SUBCUTANEOUSLY TWICE DAILY WITH A MEAL, Disp: 20 mL, Rfl: 0   lisinopril (ZESTRIL) 10 MG tablet, Take 1 tablet (10 mg total) by mouth daily., Disp: 90 tablet, Rfl: 1   meloxicam (MOBIC) 15 MG tablet, Take 1 tablet (15 mg total) by mouth daily., Disp: 15 tablet, Rfl: 0   Na Sulfate-K Sulfate-Mg Sulf (SUPREP BOWEL PREP KIT) 17.5-3.13-1.6 GM/177ML SOLN, Take 1 kit by mouth as directed. For  colonoscopy prep (Patient not taking: Reported on 01/10/2021), Disp: 354 mL, Rfl: 0   omeprazole (PRILOSEC) 20 MG capsule, Take by mouth., Disp: , Rfl:    ondansetron (ZOFRAN ODT) 4 MG disintegrating tablet, Take 1 tablet (4 mg total) by mouth every 8 (eight) hours as needed for nausea or vomiting., Disp: 20 tablet, Rfl: 0   rosuvastatin (CRESTOR) 20 MG tablet, Take 1 tablet (20 mg total) by mouth daily., Disp: 90 tablet, Rfl: 0   tamsulosin (FLOMAX) 0.4 MG CAPS capsule, Take 1 capsule (0.4 mg total) by mouth daily., Disp: 90 capsule, Rfl: 0  Current Facility-Administered Medications:    0.9 %  sodium chloride infusion, 500 mL, Intravenous, Continuous, Milus Banister, MD   Medications ordered in this encounter:  Meds ordered this encounter  Medications   amoxicillin (AMOXIL) 500 MG capsule    Sig: Take 1 capsule (500 mg total) by mouth 3 (three) times daily for 10 days.    Dispense:  30 capsule    Refill:  0    Order Specific Question:   Supervising Provider    Answer:   MILLER, BRIAN [[id number]]   ibuprofen (ADVIL) 600 MG tablet    Sig: Take 1 tablet (600 mg total) by mouth every 8 (eight) hours as needed.    Dispense:  30 tablet    Refill:  0    Order Specific Question:   Supervising Provider  Answer:   MILLER, BRIAN [[id number]]   chlorhexidine (PERIDEX) 0.12 % solution    Sig: Use as directed 15 mLs in the mouth or throat 2 (two) times daily.    Dispense:  473 mL    Refill:  0    Order Specific Question:   Supervising Provider    Answer:   Sabra Heck, BRIAN [[id number]]     *If you need refills on other medications prior to your next appointment, please contact your pharmacy*  Follow-Up: Call back or seek an in-person evaluation if the symptoms worsen or if the condition fails to improve as anticipated.     If you have been instructed to have an in-person evaluation today at a local Urgent Care facility, please use the link below. It will take you to a list of all of our available [facility], including address, phone number and hours of operation. Please do not delay care.  [facility]  If you or a family member do not have a primary care provider, use the link below to schedule a visit and establish care. When you choose a [facility] primary care physician or advanced practice provider, you gain a long-term partner in health. Find a Primary Care Provider  Learn more about [facility] in-office and virtual care options: [facility]

## 2021-04-02 NOTE — Progress Notes (Signed)
Virtual Visit Consent   [name], you are scheduled for a virtual visit with a [facility] provider today.     Just as with appointments in the office, your consent must be obtained to participate.  Your consent will be active for this visit and any virtual visit you may have with one of our providers in the next 365 days.     If you have a MyChart account, a copy of this consent can be sent to you electronically.  All virtual visits are billed to your insurance company just like a traditional visit in the office.    As this is a virtual visit, video technology does not allow for your provider to perform a traditional examination.  This may limit your provider's ability to fully assess your condition.  If your provider identifies any concerns that need to be evaluated in person or the need to arrange testing (such as labs, EKG, etc.), we will make arrangements to do so.     Although advances in technology are sophisticated, we cannot ensure that it will always work on either your end or our end.  If the connection with a video visit is poor, the visit may have to be switched to a telephone visit.  With either a video or telephone visit, we are not always able to ensure that we have a secure connection.     I need to obtain your verbal consent now.   Are you willing to proceed with your visit today?    Bradley Howe has provided verbal consent on 04/02/2021 for a virtual visit (video or telephone).   Bradley Pounds, NP   Date: 04/02/2021 1:03 PM   Virtual Visit via Video Note   I, Bradley Howe, connected with  Bradley Howe  ([id number], 1965/09/09) on 04/02/21 at  1:30 PM EST by a video-enabled telemedicine application and verified that I am speaking with the correct person using two identifiers.  Location: Patient: Virtual Visit Location Patient: Home Provider: Virtual Visit Location Provider: Home Office   I discussed the limitations of evaluation and management by telemedicine  and the availability of in person appointments. The patient expressed understanding and agreed to proceed.    History of Present Illness: Bradley Howe is a 55 y.o. who identifies as a male who was assigned male at birth, and is being seen today for Dental Pain.  HPI:  Endorses dental pain in the right lower molar along with gum swelling and tooth sensitivity to temperature changes. He has poor dentition and can only afford to get a few teeth pulled out at a time.   Problems:  Patient Active Problem List   Diagnosis Date Noted   Obesity, unspecified 08/20/2019   Ex-smoker 08/20/2019   GERD (gastroesophageal reflux disease) 08/20/2019   Elevated LFTs 08/20/2019   Angina pectoris ([place])    Chest pain 09/17/2015   Type 2 diabetes mellitus without complications ([place]) 17/40/8144   Hypercholesterolemia 01/15/2013   Renal insufficiency 07/26/2012   Hyponatremia 07/26/2012   Essential (primary) hypertension 07/26/2012   Left inguinal hernia 05/19/2011    Allergies: No Known Allergies Medications:  Current Outpatient Medications:    amoxicillin (AMOXIL) 500 MG capsule, Take 1 capsule (500 mg total) by mouth 3 (three) times daily for 10 days., Disp: 30 capsule, Rfl: 0   chlorhexidine (PERIDEX) 0.12 % solution, Use as directed 15 mLs in the mouth or throat 2 (two) times daily., Disp: 473 mL, Rfl: 0   ibuprofen (ADVIL) 600  MG tablet, Take 1 tablet (600 mg total) by mouth every 8 (eight) hours as needed., Disp: 30 tablet, Rfl: 0   Continuous Blood Gluc Sensor ([place]) MISC, Patient to use 1 system in a span of 14 days (Patient not taking: No sig reported), Disp: 1 each, Rfl: 1   cyclobenzaprine (FLEXERIL) 10 MG tablet, Take 1 tablet (10 mg total) by mouth 3 (three) times daily as needed for muscle spasms., Disp: 30 tablet, Rfl: 0   gabapentin (NEURONTIN) 300 MG capsule, Take 1 capsule (300 mg total) by mouth at bedtime., Disp: 90 capsule, Rfl: 0   glipiZIDE (GLUCOTROL) 5 MG  tablet, Take 1 tablet (5 mg total) by mouth 2 (two) times daily., Disp: 90 tablet, Rfl: 1   HUMULIN 70/30 (70-30) 100 UNIT/ML injection, INJECT 52 UNITS SUBCUTANEOUSLY TWICE DAILY WITH A MEAL, Disp: 20 mL, Rfl: 0   lisinopril (ZESTRIL) 10 MG tablet, Take 1 tablet (10 mg total) by mouth daily., Disp: 90 tablet, Rfl: 1   meloxicam (MOBIC) 15 MG tablet, Take 1 tablet (15 mg total) by mouth daily., Disp: 15 tablet, Rfl: 0   Na Sulfate-K Sulfate-Mg Sulf (SUPREP BOWEL PREP KIT) 17.5-3.13-1.6 GM/177ML SOLN, Take 1 kit by mouth as directed. For colonoscopy prep (Patient not taking: Reported on 01/10/2021), Disp: 354 mL, Rfl: 0   omeprazole (PRILOSEC) 20 MG capsule, Take by mouth., Disp: , Rfl:    ondansetron (ZOFRAN ODT) 4 MG disintegrating tablet, Take 1 tablet (4 mg total) by mouth every 8 (eight) hours as needed for nausea or vomiting., Disp: 20 tablet, Rfl: 0   rosuvastatin (CRESTOR) 20 MG tablet, Take 1 tablet (20 mg total) by mouth daily., Disp: 90 tablet, Rfl: 0   tamsulosin (FLOMAX) 0.4 MG CAPS capsule, Take 1 capsule (0.4 mg total) by mouth daily., Disp: 90 capsule, Rfl: 0  Current Facility-Administered Medications:    0.9 %  sodium chloride infusion, 500 mL, Intravenous, Continuous, Milus Banister, MD  Observations/Objective: Patient is well-developed, well-nourished in no acute distress.  Resting comfortably at home.  Head is normocephalic, atraumatic.  No labored breathing.  Speech is clear and coherent with logical content.  Patient is alert and oriented at baseline.    Assessment and Plan: 1. Pain, dental - amoxicillin (AMOXIL) 500 MG capsule; Take 1 capsule (500 mg total) by mouth 3 (three) times daily for 10 days.  Dispense: 30 capsule; Refill: 0 - ibuprofen (ADVIL) 600 MG tablet; Take 1 tablet (600 mg total) by mouth every 8 (eight) hours as needed.  Dispense: 30 tablet; Refill: 0 - chlorhexidine (PERIDEX) 0.12 % solution; Use as directed 15 mLs in the mouth or throat 2 (two)  times daily.  Dispense: 473 mL; Refill: 0    Follow Up Instructions: I discussed the assessment and treatment plan with the patient. The patient was provided an opportunity to ask questions and all were answered. The patient agreed with the plan and demonstrated an understanding of the instructions.  A copy of instructions were sent to the patient via MyChart unless otherwise noted below.     The patient was advised to call back or seek an in-person evaluation if the symptoms worsen or if the condition fails to improve as anticipated.  Time:  I spent 10 minutes with the patient via telehealth technology discussing the above problems/concerns.    Bradley Pounds, NP

## 2021-04-12 ENCOUNTER — Ambulatory Visit: Payer: Managed Care, Other (non HMO) | Admitting: Family Medicine

## 2021-04-15 ENCOUNTER — Telehealth: Payer: Managed Care, Other (non HMO) | Admitting: Physician Assistant

## 2021-04-15 DIAGNOSIS — J111 Influenza due to unidentified influenza virus with other respiratory manifestations: Secondary | ICD-10-CM

## 2021-04-15 MED ORDER — OSELTAMIVIR PHOSPHATE 75 MG PO CAPS: 75.0000 mg | ORAL_CAPSULE | Freq: Two times a day (BID) | ORAL | 0 refills | Status: DC

## 2021-04-15 NOTE — Progress Notes (Signed)
Virtual Visit Consent   Bradley Howe, you are scheduled for a virtual visit with a [facility] provider today.     Just as with appointments in the office, your consent must be obtained to participate.  Your consent will be active for this visit and any virtual visit you may have with one of our providers in the next 365 days.     If you have a MyChart account, a copy of this consent can be sent to you electronically.  All virtual visits are billed to your insurance company just like a traditional visit in the office.    As this is a virtual visit, video technology does not allow for your provider to perform a traditional examination.  This may limit your provider's ability to fully assess your condition.  If your provider identifies any concerns that need to be evaluated in person or the need to arrange testing (such as labs, EKG, etc.), we will make arrangements to do so.     Although advances in technology are sophisticated, we cannot ensure that it will always work on either your end or our end.  If the connection with a video visit is poor, the visit may have to be switched to a telephone visit.  With either a video or telephone visit, we are not always able to ensure that we have a secure connection.     I need to obtain your verbal consent now.   Are you willing to proceed with your visit today?    Bradley Howe has provided verbal consent on 04/15/2021 for a virtual visit (video or telephone).   Mar Daring, PA-C   Date: 04/15/2021 8:07 AM   Virtual Visit via Video Note   I, Mar Daring, connected with  Bradley Howe  ([id number], [birth year]) on 04/15/21 at  7:45 AM EST by a video-enabled telemedicine application and verified that I am speaking with the correct person using two identifiers.  Location: Patient: Virtual Visit Location Patient: Home Provider: Virtual Visit Location Provider: Home Office   I discussed the limitations of evaluation and management by  telemedicine and the availability of in person appointments. The patient expressed understanding and agreed to proceed.    History of Present Illness: Bradley Howe is a 55 y.o. who identifies as a male who was assigned male at birth, and is being seen today for possible flu.  HPI: URI  This is a new problem. The current episode started yesterday. The problem has been gradually worsening. Maximum temperature: subjective fever. Associated symptoms include congestion, diarrhea, joint pain, rhinorrhea and a sore throat. Associated symptoms comments: Chills and hot flashes. Treatments tried: robitussin. The treatment provided no relief.   Daughter diagnosed with flu 2 days ago  Problems:  Patient Active Problem List   Diagnosis Date Noted   Obesity, unspecified 08/20/2019   Ex-smoker 08/20/2019   GERD (gastroesophageal reflux disease) 08/20/2019   Elevated LFTs 08/20/2019   Angina pectoris ([place])    Chest pain 09/17/2015   Type 2 diabetes mellitus without complications ([place]) 14/43/1540   Hypercholesterolemia 01/15/2013   Renal insufficiency 07/26/2012   Hyponatremia 07/26/2012   Essential (primary) hypertension 07/26/2012   Left inguinal hernia 05/19/2011    Allergies: No Known Allergies Medications:  Current Outpatient Medications:    oseltamivir (TAMIFLU) 75 MG capsule, Take 1 capsule (75 mg total) by mouth 2 (two) times daily., Disp: 10 capsule, Rfl: 0   chlorhexidine (PERIDEX) 0.12 % solution, Use as directed 15 mLs in the  mouth or throat 2 (two) times daily., Disp: 473 mL, Rfl: 0   Continuous Blood Gluc Sensor ([place]) MISC, Patient to use 1 system in a span of 14 days (Patient not taking: No sig reported), Disp: 1 each, Rfl: 1   cyclobenzaprine (FLEXERIL) 10 MG tablet, Take 1 tablet (10 mg total) by mouth 3 (three) times daily as needed for muscle spasms., Disp: 30 tablet, Rfl: 0   gabapentin (NEURONTIN) 300 MG capsule, Take 1 capsule (300 mg total) by mouth at  bedtime., Disp: 90 capsule, Rfl: 0   glipiZIDE (GLUCOTROL) 5 MG tablet, Take 1 tablet (5 mg total) by mouth 2 (two) times daily., Disp: 90 tablet, Rfl: 1   HUMULIN 70/30 (70-30) 100 UNIT/ML injection, INJECT 52 UNITS SUBCUTANEOUSLY TWICE DAILY WITH A MEAL, Disp: 20 mL, Rfl: 0   ibuprofen (ADVIL) 600 MG tablet, Take 1 tablet (600 mg total) by mouth every 8 (eight) hours as needed., Disp: 30 tablet, Rfl: 0   lisinopril (ZESTRIL) 10 MG tablet, Take 1 tablet (10 mg total) by mouth daily., Disp: 90 tablet, Rfl: 1   meloxicam (MOBIC) 15 MG tablet, Take 1 tablet (15 mg total) by mouth daily., Disp: 15 tablet, Rfl: 0   Na Sulfate-K Sulfate-Mg Sulf (SUPREP BOWEL PREP KIT) 17.5-3.13-1.6 GM/177ML SOLN, Take 1 kit by mouth as directed. For colonoscopy prep (Patient not taking: Reported on 01/10/2021), Disp: 354 mL, Rfl: 0   omeprazole (PRILOSEC) 20 MG capsule, Take by mouth., Disp: , Rfl:    ondansetron (ZOFRAN ODT) 4 MG disintegrating tablet, Take 1 tablet (4 mg total) by mouth every 8 (eight) hours as needed for nausea or vomiting., Disp: 20 tablet, Rfl: 0   rosuvastatin (CRESTOR) 20 MG tablet, Take 1 tablet (20 mg total) by mouth daily., Disp: 90 tablet, Rfl: 0   tamsulosin (FLOMAX) 0.4 MG CAPS capsule, Take 1 capsule (0.4 mg total) by mouth daily., Disp: 90 capsule, Rfl: 0  Current Facility-Administered Medications:    0.9 %  sodium chloride infusion, 500 mL, Intravenous, Continuous, Milus Banister, MD  Observations/Objective: Patient is well-developed, well-nourished in no acute distress.  Resting comfortably  at home.  Head is normocephalic, atraumatic.  No labored breathing.  Speech is clear and coherent with logical content.  Patient is alert and oriented at baseline.    Assessment and Plan: 1. Influenza - oseltamivir (TAMIFLU) 75 MG capsule; Take 1 capsule (75 mg total) by mouth 2 (two) times daily.  Dispense: 10 capsule; Refill: 0  - Worsening symptoms started yesterday that are consistent  with flu, with a positive flu exposure - Tamiflu prescribed - OTC medications of choice for symptomatic management - Push fluids - Rest - Seek in person evaluation if not improving or worsening  Follow Up Instructions: I discussed the assessment and treatment plan with the patient. The patient was provided an opportunity to ask questions and all were answered. The patient agreed with the plan and demonstrated an understanding of the instructions.  A copy of instructions were sent to the patient via MyChart unless otherwise noted below.    The patient was advised to call back or seek an in-person evaluation if the symptoms worsen or if the condition fails to improve as anticipated.  Time:  I spent 10 minutes with the patient via telehealth technology discussing the above problems/concerns.    Mar Daring, PA-C

## 2021-04-15 NOTE — Patient Instructions (Signed)
Bradley Howe, thank you for joining Mar Daring, PA-C for today's virtual visit.  While this provider is not your primary care provider (PCP), if your PCP is located in our provider database this encounter information will be shared with them immediately following your visit.  Consent: (Patient) Bradley Howe provided verbal consent for this virtual visit at the beginning of the encounter.  Current Medications:  Current Outpatient Medications:    oseltamivir (TAMIFLU) 75 MG capsule, Take 1 capsule (75 mg total) by mouth 2 (two) times daily., Disp: 10 capsule, Rfl: 0   chlorhexidine (PERIDEX) 0.12 % solution, Use as directed 15 mLs in the mouth or throat 2 (two) times daily., Disp: 473 mL, Rfl: 0   Continuous Blood Gluc Sensor (Spry) MISC, Patient to use 1 system in a span of 14 days (Patient not taking: No sig reported), Disp: 1 each, Rfl: 1   cyclobenzaprine (FLEXERIL) 10 MG tablet, Take 1 tablet (10 mg total) by mouth 3 (three) times daily as needed for muscle spasms., Disp: 30 tablet, Rfl: 0   gabapentin (NEURONTIN) 300 MG capsule, Take 1 capsule (300 mg total) by mouth at bedtime., Disp: 90 capsule, Rfl: 0   glipiZIDE (GLUCOTROL) 5 MG tablet, Take 1 tablet (5 mg total) by mouth 2 (two) times daily., Disp: 90 tablet, Rfl: 1   HUMULIN 70/30 (70-30) 100 UNIT/ML injection, INJECT 52 UNITS SUBCUTANEOUSLY TWICE DAILY WITH A MEAL, Disp: 20 mL, Rfl: 0   ibuprofen (ADVIL) 600 MG tablet, Take 1 tablet (600 mg total) by mouth every 8 (eight) hours as needed., Disp: 30 tablet, Rfl: 0   lisinopril (ZESTRIL) 10 MG tablet, Take 1 tablet (10 mg total) by mouth daily., Disp: 90 tablet, Rfl: 1   meloxicam (MOBIC) 15 MG tablet, Take 1 tablet (15 mg total) by mouth daily., Disp: 15 tablet, Rfl: 0   Na Sulfate-K Sulfate-Mg Sulf (SUPREP BOWEL PREP KIT) 17.5-3.13-1.6 GM/177ML SOLN, Take 1 kit by mouth as directed. For colonoscopy prep (Patient not taking: Reported on 01/10/2021),  Disp: 354 mL, Rfl: 0   omeprazole (PRILOSEC) 20 MG capsule, Take by mouth., Disp: , Rfl:    ondansetron (ZOFRAN ODT) 4 MG disintegrating tablet, Take 1 tablet (4 mg total) by mouth every 8 (eight) hours as needed for nausea or vomiting., Disp: 20 tablet, Rfl: 0   rosuvastatin (CRESTOR) 20 MG tablet, Take 1 tablet (20 mg total) by mouth daily., Disp: 90 tablet, Rfl: 0   tamsulosin (FLOMAX) 0.4 MG CAPS capsule, Take 1 capsule (0.4 mg total) by mouth daily., Disp: 90 capsule, Rfl: 0  Current Facility-Administered Medications:    0.9 %  sodium chloride infusion, 500 mL, Intravenous, Continuous, Milus Banister, MD   Medications ordered in this encounter:  Meds ordered this encounter  Medications   oseltamivir (TAMIFLU) 75 MG capsule    Sig: Take 1 capsule (75 mg total) by mouth 2 (two) times daily.    Dispense:  10 capsule    Refill:  0    Order Specific Question:   Supervising Provider    Answer:   Sabra Heck, [place]     *If you need refills on other medications prior to your next appointment, please contact your pharmacy*  Follow-Up: Call back or seek an in-person evaluation if the symptoms worsen or if the condition fails to improve as anticipated.  Other Instructions Influenza, Adult Influenza, also called "the flu," is a viral infection that mainly affects the respiratory tract. This includes the lungs,  nose, and throat. The flu spreads easily from person to person (is contagious). It causes common cold symptoms, along with high fever and body aches. What are the causes? This condition is caused by the influenza virus. You can get the virus by: Breathing in droplets that are in the air from an infected person's cough or sneeze. Touching something that has the virus on it (has been contaminated) and then touching your mouth, nose, or eyes. What increases the risk? The following factors may make you more likely to get the flu: Not washing or sanitizing your hands often. Having  close contact with many people during cold and flu season. Touching your mouth, eyes, or nose without first washing or sanitizing your hands. Not getting an annual flu shot. You may have a higher risk for the flu, including serious problems, such as a lung infection (pneumonia), if you: Are older than 65. Are pregnant. Have a weakened disease-fighting system (immune system). This includes people who have HIV or AIDS, are on chemotherapy, or are taking medicines that reduce (suppress) the immune system. Have a long-term (chronic) illness, such as heart disease, kidney disease, diabetes, or lung disease. Have a liver disorder. Are severely overweight (morbidly obese). Have anemia. Have asthma. What are the signs or symptoms? Symptoms of this condition usually begin suddenly and last 4-14 days. These may include: Fever and chills. Headaches, body aches, or muscle aches. Sore throat. Cough. Runny or stuffy (congested) nose. Chest discomfort. Poor appetite. Weakness or fatigue. Dizziness. Nausea or vomiting. How is this diagnosed? This condition may be diagnosed based on: Your symptoms and medical history. A physical exam. Swabbing your nose or throat and testing the fluid for the influenza virus. How is this treated? If the flu is diagnosed early, you can be treated with antiviral medicine that is given by mouth (orally) or through an IV. This can help reduce how severe the illness is and how long it lasts. Taking care of yourself at home can help relieve symptoms. Your health care provider may recommend: Taking over-the-counter medicines. Drinking plenty of fluids. In many cases, the flu goes away on its own. If you have severe symptoms or complications, you may be treated in a hospital. Follow these instructions at home: Activity Rest as needed and get plenty of sleep. Stay home from work or school as told by your health care provider. Unless you are visiting your health care  provider, avoid leaving home until your fever has been gone for 24 hours without taking medicine. Eating and drinking Take an oral rehydration solution (ORS). This is a drink that is sold at pharmacies and retail stores. Drink enough fluid to keep your urine pale yellow. Drink clear fluids in small amounts as you are able. Clear fluids include water, ice chips, fruit juice mixed with water, and low-calorie sports drinks. Eat bland, easy-to-digest foods in small amounts as you are able. These foods include bananas, applesauce, rice, lean meats, toast, and crackers. Avoid drinking fluids that contain a lot of sugar or caffeine, such as energy drinks, regular sports drinks, and soda. Avoid alcohol. Avoid spicy or fatty foods. General instructions   Take over-the-counter and prescription medicines only as told by your health care provider. Use a cool mist humidifier to add humidity to the air in your home. This can make it easier to breathe. When using a cool mist humidifier, clean it daily. Empty the water and replace it with clean water. Cover your mouth and nose when you cough  or sneeze. Wash your hands with soap and water often and for at least 20 seconds, especially after you cough or sneeze. If soap and water are not available, use alcohol-based hand sanitizer. Keep all follow-up visits. This is important. How is this prevented?  Get an annual flu shot. This is usually available in late summer, fall, or winter. Ask your health care provider when you should get your flu shot. Avoid contact with people who are sick during cold and flu season. This is generally fall and winter. Contact a health care provider if: You develop new symptoms. You have: Chest pain. Diarrhea. A fever. Your cough gets worse. You produce more mucus. You feel nauseous or you vomit. Get help right away if you: Develop shortness of breath or have difficulty breathing. Have skin or nails that turn a bluish  color. Have severe pain or stiffness in your neck. Develop a sudden headache or sudden pain in your face or ear. Cannot eat or drink without vomiting. These symptoms may represent a serious problem that is an emergency. Do not wait to see if the symptoms will go away. Get medical help right away. Call your local emergency services (911 in the [place] Do not drive yourself to the hospital. Summary Influenza, also called "the flu," is a viral infection that primarily affects your respiratory tract. Symptoms of the flu usually begin suddenly and last 4-14 days. Getting an annual flu shot is the best way to prevent getting the flu. Stay home from work or school as told by your health care provider. Unless you are visiting your health care provider, avoid leaving home until your fever has been gone for 24 hours without taking medicine. Keep all follow-up visits. This is important. This information is not intended to replace advice given to you by your health care provider. Make sure you discuss any questions you have with your health care provider. Document Revised: 12/19/2019 Document Reviewed: 12/19/2019 Elsevier Patient Education  2022 [employer].    If you have been instructed to have an in-person evaluation today at a local Urgent Care facility, please use the link below. It will take you to a list of all of our available [facility], including address, phone number and hours of operation. Please do not delay care.  [facility]  If you or a family member do not have a primary care provider, use the link below to schedule a visit and establish care. When you choose a [facility] primary care physician or advanced practice provider, you gain a long-term partner in health. Find a Primary Care Provider  Learn more about 's in-office and virtual care options: [facility]

## 2021-04-19 ENCOUNTER — Encounter: Payer: Self-pay | Admitting: Family Medicine

## 2021-04-19 ENCOUNTER — Other Ambulatory Visit: Payer: Self-pay

## 2021-04-19 ENCOUNTER — Ambulatory Visit: Payer: Managed Care, Other (non HMO) | Admitting: Family Medicine

## 2021-04-19 VITALS — BP 123/78 | HR 97 | Temp 98.2°F | Resp 18 | Ht 67.0 in | Wt 227.5 lb

## 2021-04-19 DIAGNOSIS — E1165 Type 2 diabetes mellitus with hyperglycemia: Secondary | ICD-10-CM | POA: Diagnosis not present

## 2021-04-19 DIAGNOSIS — N529 Male erectile dysfunction, unspecified: Secondary | ICD-10-CM

## 2021-04-19 DIAGNOSIS — I10 Essential (primary) hypertension: Secondary | ICD-10-CM

## 2021-04-19 DIAGNOSIS — Z794 Long term (current) use of insulin: Secondary | ICD-10-CM

## 2021-04-19 DIAGNOSIS — E785 Hyperlipidemia, unspecified: Secondary | ICD-10-CM

## 2021-04-19 LAB — POCT GLYCOSYLATED HEMOGLOBIN (HGB A1C): Hemoglobin A1C: 10.7 % — AB (ref 4.0–5.6)

## 2021-04-19 MED ORDER — ROSUVASTATIN CALCIUM 20 MG PO TABS: 20.0000 mg | ORAL_TABLET | Freq: Every day | ORAL | 1 refills | Status: DC

## 2021-04-19 MED ORDER — SILDENAFIL CITRATE 100 MG PO TABS: 50.0000 mg | ORAL_TABLET | Freq: Every day | ORAL | 11 refills | Status: DC | PRN

## 2021-04-19 MED ORDER — FREESTYLE LIBRE 2 SENSOR MISC: 5 refills | Status: DC

## 2021-04-19 MED ORDER — HUMULIN 70/30 (70-30) 100 UNIT/ML ~~LOC~~ SUSP: SUBCUTANEOUS | 0 refills | Status: DC

## 2021-04-19 NOTE — Progress Notes (Signed)
Established Patient Office Visit  Subjective:  Patient ID: Bradley Howe, male    DOB: [birth year]  Age: 55 y.o. MRN: [id number]  CC:  Chief Complaint  Patient presents with   Diabetes    HPI Bradley Howe presents for follow up of chronic med issues including diabetes and hypertension. Patient also complains or ED.   Past Medical History:  Diagnosis Date   GERD (gastroesophageal reflux disease)    High cholesterol    Hypertension    Type II diabetes mellitus (HCC)     Past Surgical History:  Procedure Laterality Date   INGUINAL HERNIA REPAIR Left 2013    Family History  Problem Relation Age of Onset   Hypertension Mother    Hypertension Father    Diabetes Paternal Grandmother    Heart disease Neg Hx    Colon cancer Neg Hx    Pancreatic cancer Neg Hx    Esophageal cancer Neg Hx    Stomach cancer Neg Hx    Rectal cancer Neg Hx     Social History   Socioeconomic History   Marital status: Married    Spouse name: Not on file   Number of children: Not on file   Years of education: Not on file   Highest education level: Not on file  Occupational History   Not on file  Tobacco Use   Smoking status: Former    Packs/day: 0.10    Years: 25.00    Pack years: 2.50    Types: Cigarettes   Smokeless tobacco: Never   Tobacco comments:    "quit smoking cigarettes in 2013"  Substance and Sexual Activity   Alcohol use: Yes    Comment: occ   Drug use: No   Sexual activity: Yes  Other Topics Concern   Not on file  Social History Narrative   Not on file   Social Determinants of Health   Financial Resource Strain: Not on file  Food Insecurity: Not on file  Transportation Needs: Not on file  Physical Activity: Not on file  Stress: Not on file  Social Connections: Not on file  Intimate Partner Violence: Not on file    ROS Review of Systems  All other systems reviewed and are negative.  Objective:   Today's Vitals: BP 123/78   Pulse 97   Temp 98.2 F (36.8  C)   Resp 18   Ht 5' 7"  (1.702 m)   Wt 227 lb 8 oz (103.2 kg)   SpO2 96%   BMI 35.63 kg/m   Physical Exam Vitals and nursing note reviewed.  Constitutional:      General: He is not in acute distress. Cardiovascular:     Rate and Rhythm: Normal rate and regular rhythm.  Pulmonary:     Effort: Pulmonary effort is normal.     Breath sounds: Normal breath sounds.  Abdominal:     Palpations: Abdomen is soft.     Tenderness: There is no abdominal tenderness.  Musculoskeletal:     Right lower leg: No edema.     Left lower leg: No edema.  Neurological:     General: No focal deficit present.     Mental Status: He is alert and oriented to person, place, and time.    Assessment & Plan:   1. Type 2 diabetes mellitus with hyperglycemia, with long-term current use of insulin (HCC) Slight decreased of A1c but far above goal. Will refer to [facility] for med management. Glucose monitoring sensor prescribed.   -  POCT glycosylated hemoglobin (Hb A1C) - insulin NPH-regular Human (HUMULIN 70/30) (70-30) 100 UNIT/ML injection; INJECT 52 UNITS SUBCUTANEOUSLY TWICE DAILY WITH A MEAL  Dispense: 20 mL; Refill: 0  2. Essential hypertension Appears stable. Will continue present management and monitor.   3. Hyperlipidemia, unspecified hyperlipidemia type Continue present management. Meds refilled.  - rosuvastatin (CRESTOR) 20 MG tablet; Take 1 tablet (20 mg total) by mouth daily.  Dispense: 90 tablet; Refill: 1  4. Erectile dysfunction, unspecified erectile dysfunction type Viagra 165m prescribed.      Outpatient Encounter Medications as of 04/19/2021  Medication Sig   Continuous Blood Gluc Sensor ([place] MISC Patient to use 1 system in a span of 14 days   gabapentin (NEURONTIN) 300 MG capsule Take 1 capsule (300 mg total) by mouth at bedtime.   HUMULIN 70/30 (70-30) 100 UNIT/ML injection INJECT 52 UNITS SUBCUTANEOUSLY TWICE DAILY WITH A MEAL   ibuprofen (ADVIL) 600 MG  tablet Take 1 tablet (600 mg total) by mouth every 8 (eight) hours as needed.   lisinopril (ZESTRIL) 10 MG tablet Take 1 tablet (10 mg total) by mouth daily.   omeprazole (PRILOSEC) 20 MG capsule Take by mouth.   rosuvastatin (CRESTOR) 20 MG tablet Take 1 tablet (20 mg total) by mouth daily.   chlorhexidine (PERIDEX) 0.12 % solution Use as directed 15 mLs in the mouth or throat 2 (two) times daily. (Patient not taking: Reported on 04/19/2021)   cyclobenzaprine (FLEXERIL) 10 MG tablet Take 1 tablet (10 mg total) by mouth 3 (three) times daily as needed for muscle spasms. (Patient not taking: Reported on 04/19/2021)   glipiZIDE (GLUCOTROL) 5 MG tablet Take 1 tablet (5 mg total) by mouth 2 (two) times daily. (Patient not taking: Reported on 04/19/2021)   meloxicam (MOBIC) 15 MG tablet Take 1 tablet (15 mg total) by mouth daily. (Patient not taking: Reported on 04/19/2021)   Na Sulfate-K Sulfate-Mg Sulf (SUPREP BOWEL PREP KIT) 17.5-3.13-1.6 GM/177ML SOLN Take 1 kit by mouth as directed. For colonoscopy prep (Patient not taking: Reported on 04/19/2021)   ondansetron (ZOFRAN ODT) 4 MG disintegrating tablet Take 1 tablet (4 mg total) by mouth every 8 (eight) hours as needed for nausea or vomiting. (Patient not taking: Reported on 04/19/2021)   oseltamivir (TAMIFLU) 75 MG capsule Take 1 capsule (75 mg total) by mouth 2 (two) times daily. (Patient not taking: Reported on 04/19/2021)   tamsulosin (FLOMAX) 0.4 MG CAPS capsule Take 1 capsule (0.4 mg total) by mouth daily. (Patient not taking: Reported on 04/19/2021)   Facility-Administered Encounter Medications as of 04/19/2021  Medication   0.9 %  sodium chloride infusion    Follow-up: Return in about 3 months (around 07/18/2021) for follow up, chronic med issues.   WBecky Sax MD

## 2021-04-21 DIAGNOSIS — N529 Male erectile dysfunction, unspecified: Secondary | ICD-10-CM | POA: Insufficient documentation

## 2021-04-29 ENCOUNTER — Ambulatory Visit: Payer: Managed Care, Other (non HMO) | Admitting: Pharmacist

## 2021-05-26 ENCOUNTER — Ambulatory Visit (INDEPENDENT_AMBULATORY_CARE_PROVIDER_SITE_OTHER): Payer: Managed Care, Other (non HMO) | Admitting: Family Medicine

## 2021-05-26 ENCOUNTER — Encounter: Payer: Self-pay | Admitting: Family Medicine

## 2021-05-26 ENCOUNTER — Other Ambulatory Visit: Payer: Self-pay

## 2021-05-26 DIAGNOSIS — E1165 Type 2 diabetes mellitus with hyperglycemia: Secondary | ICD-10-CM | POA: Diagnosis not present

## 2021-05-26 DIAGNOSIS — Z794 Long term (current) use of insulin: Secondary | ICD-10-CM

## 2021-05-26 NOTE — Progress Notes (Signed)
Virtual Visit via Telephone Note  I connected with Bradley Howe on 05/26/21 at  3:40 PM EST by telephone and verified that I am speaking with the correct person using two identifiers.  Location: Patient:  [facility] Provider: [place]   I discussed the limitations, risks, security and privacy concerns of performing an evaluation and management service by telephone and the availability of in person appointments. I also discussed with the patient that there may be a patient responsible charge related to this service. The patient expressed understanding and agreed to proceed.   History of Present Illness: Patient reports fluctuating blood sugars and would like additional meds to help with this.    Observations/Objective: Patient notes blood sugars with libre.   Assessment and Plan: 1. Type 2 diabetes mellitus with hyperglycemia, with long-term current use of insulin [facility])  Patient to keep scheduled follow up with Lurena Joiner on 1/17 for diabetic med management.   Follow Up Instructions:    I discussed the assessment and treatment plan with the patient. The patient was provided an opportunity to ask questions and all were answered. The patient agreed with the plan and demonstrated an understanding of the instructions.   The patient was advised to call back or seek an in-person evaluation if the symptoms worsen or if the condition fails to improve as anticipated.  I provided 5 minutes of non-face-to-face time during this encounter.   Becky Sax, MD

## 2021-05-31 ENCOUNTER — Ambulatory Visit: Payer: Managed Care, Other (non HMO) | Admitting: Pharmacist

## 2021-06-01 ENCOUNTER — Telehealth: Payer: Managed Care, Other (non HMO) | Admitting: Family

## 2021-06-01 DIAGNOSIS — U071 COVID-19: Secondary | ICD-10-CM

## 2021-06-01 DIAGNOSIS — Z20822 Contact with and (suspected) exposure to covid-19: Secondary | ICD-10-CM | POA: Diagnosis not present

## 2021-06-01 MED ORDER — MOLNUPIRAVIR EUA 200MG CAPSULE: 4.0000 | ORAL_CAPSULE | Freq: Two times a day (BID) | ORAL | 0 refills | Status: AC

## 2021-06-01 NOTE — Progress Notes (Signed)
Virtual Visit Consent   [name], you are scheduled for a virtual visit with a [facility] provider today.     Just as with appointments in the office, your consent must be obtained to participate.  Your consent will be active for this visit and any virtual visit you may have with one of our providers in the next 365 days.     If you have a MyChart account, a copy of this consent can be sent to you electronically.  All virtual visits are billed to your insurance company just like a traditional visit in the office.    As this is a virtual visit, video technology does not allow for your provider to perform a traditional examination.  This may limit your provider's ability to fully assess your condition.  If your provider identifies any concerns that need to be evaluated in person or the need to arrange testing (such as labs, EKG, etc.), we will make arrangements to do so.     Although advances in technology are sophisticated, we cannot ensure that it will always work on either your end or our end.  If the connection with a video visit is poor, the visit may have to be switched to a telephone visit.  With either a video or telephone visit, we are not always able to ensure that we have a secure connection.     I need to obtain your verbal consent now.   Are you willing to proceed with your visit today?    [name] has provided verbal consent on 06/01/2021 for a virtual visit (video or telephone).   [name], FNP   Date: 06/01/2021 7:56 AM   Virtual Visit via Video Note   I, [name], connected with  Bradley Howe  ([id number], 14-Feb-1966) on 06/01/21 at  7:45 AM EST by a video-enabled telemedicine application and verified that I am speaking with the correct person using two identifiers.  Location: Patient: Virtual Visit Location Patient: Home Provider: Virtual Visit Location Provider: Home Office   I discussed the limitations of evaluation and management by telemedicine and the  availability of in person appointments. The patient expressed understanding and agreed to proceed.    History of Present Illness: Bradley Howe is a 56 y.o. who identifies as a male who was assigned male at birth, and is being seen today for COVID symptoms that started two days ago. He reports he is having headache, nausea, diarrhea, chills, and fever. He reports his wife and daughter both have COVID.   HPI: Cough This is a new problem. The current episode started in the past 7 days. The problem has been gradually worsening. The problem occurs every few minutes. Associated symptoms include chills, a fever, headaches, myalgias, nasal congestion, postnasal drip, rhinorrhea and wheezing. Pertinent negatives include no ear congestion, ear pain or shortness of breath. He has tried rest for the symptoms. The treatment provided mild relief.   Problems:  Patient Active Problem List   Diagnosis Date Noted   Erectile dysfunction 04/21/2021   Obesity, unspecified 08/20/2019   Ex-smoker 08/20/2019   GERD (gastroesophageal reflux disease) 08/20/2019   Elevated LFTs 08/20/2019   Angina pectoris ([place])    Chest pain 09/17/2015   Type 2 diabetes mellitus with hyperglycemia, with long-term current use of insulin ([place]) 01/15/2013   Hypercholesterolemia 01/15/2013   Renal insufficiency 07/26/2012   Hyponatremia 07/26/2012   Essential (primary) hypertension 07/26/2012   Left inguinal hernia 05/19/2011    Allergies: No Known Allergies  Medications:  Current Outpatient Medications:    molnupiravir EUA (LAGEVRIO) 200 mg CAPS capsule, Take 4 capsules (800 mg total) by mouth 2 (two) times daily for 5 days., Disp: 40 capsule, Rfl: 0   Continuous Blood Gluc Sensor (FREESTYLE LIBRE 2 SENSOR) MISC, Utilize as recommended to monitor blood glucose, Disp: 2 each, Rfl: 5   gabapentin (NEURONTIN) 300 MG capsule, Take 1 capsule (300 mg total) by mouth at bedtime., Disp: 90 capsule, Rfl: 0   ibuprofen (ADVIL) 600 MG tablet,  Take 1 tablet (600 mg total) by mouth every 8 (eight) hours as needed., Disp: 30 tablet, Rfl: 0   insulin NPH-regular Human (HUMULIN 70/30) (70-30) 100 UNIT/ML injection, INJECT 52 UNITS SUBCUTANEOUSLY TWICE DAILY WITH A MEAL, Disp: 20 mL, Rfl: 0   lisinopril (ZESTRIL) 10 MG tablet, Take 1 tablet (10 mg total) by mouth daily., Disp: 90 tablet, Rfl: 1   omeprazole (PRILOSEC) 20 MG capsule, Take by mouth., Disp: , Rfl:    rosuvastatin (CRESTOR) 20 MG tablet, Take 1 tablet (20 mg total) by mouth daily., Disp: 90 tablet, Rfl: 1   sildenafil (VIAGRA) 100 MG tablet, Take 0.5-1 tablets (50-100 mg total) by mouth daily as needed for erectile dysfunction., Disp: 10 tablet, Rfl: 11  Current Facility-Administered Medications:    0.9 %  sodium chloride infusion, 500 mL, Intravenous, Continuous, Milus Banister, MD  Observations/Objective: Patient is well-developed, well-nourished in no acute distress.  Resting comfortably  at home.  Head is normocephalic, atraumatic.  No labored breathing.  Speech is clear and coherent with logical content.  Patient is alert and oriented at baseline.    Assessment and Plan: 1. COVID-19 - molnupiravir EUA (LAGEVRIO) 200 mg CAPS capsule; Take 4 capsules (800 mg total) by mouth 2 (two) times daily for 5 days.  Dispense: 40 capsule; Refill: 0  COVID positive, rest, force fluids, tylenol as needed, Quarantine for at least 5 days and you are fever free, then must wear a mask out in public from day 6-22, report any worsening symptoms such as increased shortness of breath, swelling, or continued high fevers. Possible adverse effects discussed with antivirals.    Follow Up Instructions: I discussed the assessment and treatment plan with the patient. The patient was provided an opportunity to ask questions and all were answered. The patient agreed with the plan and demonstrated an understanding of the instructions.  A copy of instructions were sent to the patient via MyChart  unless otherwise noted below.     The patient was advised to call back or seek an in-person evaluation if the symptoms worsen or if the condition fails to improve as anticipated.  Time:  I spent 11 minutes with the patient via telehealth technology discussing the above problems/concerns.    [name], FNP

## 2021-06-17 ENCOUNTER — Other Ambulatory Visit: Payer: Self-pay

## 2021-06-17 ENCOUNTER — Encounter: Payer: Self-pay | Admitting: Pharmacist

## 2021-06-17 ENCOUNTER — Ambulatory Visit: Payer: Managed Care, Other (non HMO) | Attending: Family Medicine | Admitting: Pharmacist

## 2021-06-17 DIAGNOSIS — Z794 Long term (current) use of insulin: Secondary | ICD-10-CM | POA: Diagnosis not present

## 2021-06-17 DIAGNOSIS — E1165 Type 2 diabetes mellitus with hyperglycemia: Secondary | ICD-10-CM | POA: Diagnosis not present

## 2021-06-17 MED ORDER — OZEMPIC (0.25 OR 0.5 MG/DOSE) 2 MG/1.5ML ~~LOC~~ SOPN
0.2500 mg | PEN_INJECTOR | SUBCUTANEOUS | 1 refills | Status: DC
  Filled 2021-06-17: qty 1.5, 42d supply, fill #0

## 2021-06-17 MED ORDER — HUMULIN 70/30 (70-30) 100 UNIT/ML ~~LOC~~ SUSP: 48.0000 [IU] | Freq: Two times a day (BID) | SUBCUTANEOUS | 2 refills | Status: DC

## 2021-06-17 NOTE — Patient Instructions (Addendum)
Thank you for coming to see me today. Please do the following:  Start Ozempic once weekly on Fridays. Inject 0.25 mg once a week. Decrease your insulin to 48 units BID.  Continue checking blood sugars at home.  Continue making the lifestyle changes we've discussed together during our visit. Diet and exercise play a significant role in improving your blood sugars.  Follow-up with me in 1 month.    Hypoglycemia or low blood sugar:   Low blood sugar can happen quickly and may become an emergency if not treated right away.   While this shouldn't happen often, it can be brought upon if you skip a meal or do not eat enough. Also, if your insulin or other diabetes medications are dosed too high, this can cause your blood sugar to go to low.   Warning signs of low blood sugar include: Feeling shaky or dizzy Feeling weak or tired  Excessive hunger Feeling anxious or upset  Sweating even when you aren't exercising  What to do if I experience low blood sugar? Check your blood sugar with your meter. If lower than 70, proceed to step 2.  Treat with 3-4 glucose tablets or 3 packets of regular sugar. If these aren't around, you can try hard candy. Yet another option would be to drink 4 ounces of fruit juice or 6 ounces of REGULAR soda.  Re-check your sugar in 15 minutes. If it is still below 70, do what you did in step 2 again. If has come back up, go ahead and eat a snack or small meal at this time.

## 2021-06-17 NOTE — Progress Notes (Signed)
S:    PCP: Dr. Redmond Pulling   No chief complaint on file.  Patient arrives in good spirits. Presents for diabetes evaluation, education, and management. Patient was referred and last seen by Primary Care Provider on 05/26/2021. A1c is 10.7 (04/19/2022).   Patient reports Diabetes was diagnosed in 10 years. Pt says at the time he was symptomatic and went to the hospital. He was started on Lantus. He tells me the Lantus worked well for him, however, it became too expensive over time and that is why he is taking 70/30 insulin. He cannot tolerate metformin. Denies any hx of ACS, stroke, CHF, or CKD. No thyroid cancer. No pancreatitis.    PMH: HTN, obesity, T2DM, HLD, and GERD.  Family/Social History:  -Fhx: DM  -Tobacco: never smoker  -Alcohol: endorses occasional use  Insurance coverage/medication affordability: [employer]  Medication adherence reported.   Current diabetes medications include: insulin NPH 70/30 52 units BID Current hypertension medications include: lisinopril 10 mg daily (requests refill) Current hyperlipidemia medications include: rosuvastatin 20 mg daily   Patient denies hypoglycemic events.   Patient reported dietary habits:  - Drinks coffee (1 cup), 3-4 glasses of water daily, apple juice (~1 glass several times weekly) - Denies eating excess sweets. Will sometimes eat these to treat relative hypoglycemia.  - Admits to heavy consumption of carbs   Patient-reported exercise habits: none outside of work   Patient reports polyuria (nighttime urination).  Patient reports neuropathy (nerve pain). Used to take gabapentin at nighttime but he tells me this worked.  Patient reports visual changes. Patient reports self foot exams.   O:  Lab Results  Component Value Date   HGBA1C 10.7 (A) 04/19/2021   There were no vitals filed for this visit.  Lipid Panel     Component Value Date/Time   CHOL 203 (H) 01/10/2021 1724   TRIG 431 (H) 01/10/2021 1724   HDL 30 (L)  01/10/2021 1724   CHOLHDL 6.8 (H) 01/10/2021 1724   CHOLHDL 6.0 09/17/2015 0906   VLDL 64 (H) 09/17/2015 0906   LDLCALC 100 (H) 01/10/2021 1724   Brings his libre meter for review. Download shows the following: Time in target: 70-180: 46% 181-240: 32% >240: 22%  Clinical Atherosclerotic Cardiovascular Disease (ASCVD): No  The 10-year ASCVD risk score (Arnett DK, et al., 2019) is: 23.1%   Values used to calculate the score:     Age: 56 years     Sex: Male     Is Non-Hispanic African American: Yes     Diabetic: Yes     Tobacco smoker: No     Systolic Blood Pressure: 505 mmHg     Is BP treated: Yes     HDL Cholesterol: 30 mg/dL     Total Cholesterol: 203 mg/dL   A/P:  Diabetes longstanding currently uncontrolled. Patient is able to verbalize appropriate hypoglycemia management plan. Medication adherence appears appropriate. His insurance will cover Ozempic.  -Started Ozempic 0.25 mg weekly.  -Patient was educated on the use of the Ozempic pen. Reviewed necessary supplies and operation of the pen. Also reviewed goal blood glucose levels. Patient was able to demonstrate use.  -Decreased dose of 70/30 to 48 units BID.  -Extensively discussed pathophysiology of diabetes, recommended lifestyle interventions, dietary effects on blood sugar control -Counseled on s/sx of and management of hypoglycemia -Next A1C anticipated 07/2021.   ASCVD risk - primary prevention in patient with diabetes. Last LDL is not controlled. ASCVD risk score is >20%  - high  intensity statin indicated.  -Continued rosuvastatin 20 mg.   Written patient instructions provided.  Total time in face to face counseling 30 minutes.   Follow up Pharmacist Clinic Visit in 1 month.     Benard Halsted, PharmD, [facility], [place] [phone]

## 2021-07-14 ENCOUNTER — Telehealth: Payer: Managed Care, Other (non HMO) | Admitting: Physician Assistant

## 2021-07-14 ENCOUNTER — Encounter: Payer: Managed Care, Other (non HMO) | Admitting: Family Medicine

## 2021-07-14 DIAGNOSIS — K047 Periapical abscess without sinus: Secondary | ICD-10-CM

## 2021-07-14 MED ORDER — AMOXICILLIN-POT CLAVULANATE 875-125 MG PO TABS: 1.0000 | ORAL_TABLET | Freq: Two times a day (BID) | ORAL | 0 refills | Status: DC

## 2021-07-14 NOTE — Progress Notes (Signed)
?Virtual Visit Consent  ? ?[name], you are scheduled for a virtual visit with a [facility] provider today.   ?  ?Just as with appointments in the office, your consent must be obtained to participate.  Your consent will be active for this visit and any virtual visit you may have with one of our providers in the next 365 days.   ?  ?If you have a MyChart account, a copy of this consent can be sent to you electronically.  All virtual visits are billed to your insurance company just like a traditional visit in the office.   ? ?As this is a virtual visit, video technology does not allow for your provider to perform a traditional examination.  This may limit your provider's ability to fully assess your condition.  If your provider identifies any concerns that need to be evaluated in person or the need to arrange testing (such as labs, EKG, etc.), we will make arrangements to do so.   ?  ?Although advances in technology are sophisticated, we cannot ensure that it will always work on either your end or our end.  If the connection with a video visit is poor, the visit may have to be switched to a telephone visit.  With either a video or telephone visit, we are not always able to ensure that we have a secure connection.    ? ?I need to obtain your verbal consent now.   Are you willing to proceed with your visit today?  ?  ?[name] has provided verbal consent on 07/14/2021 for a virtual visit (video or telephone). ?  ?Leeanne Rio, PA-C  ? ?Date: 07/14/2021 11:40 AM ? ? ?Virtual Visit via Video Note  ? ?ILeeanne Rio, connected with  [name]  ([id number], 1965/06/27) on 07/14/21 at 11:30 AM EST by a video-enabled telemedicine application and verified that I am speaking with the correct person using two identifiers. ? ?Location: ?Patient: Virtual Visit Location Patient: Home ?Provider: Virtual Visit Location Provider: Home Office ?  ?I discussed the limitations of evaluation and management by  telemedicine and the availability of in person appointments. The patient expressed understanding and agreed to proceed.   ? ?History of Present Illness: ?Bradley Howe is a 56 y.o. who identifies as a male who was assigned male at birth, and is being seen today for possible infected tooth. Notes history of dental abscess last year but had those teeth pulled without issue. Since last night he started having some discomfort of R upper molar. Denies any trauma or injury to the tooth. Notes since this morning more significant pain with gum swelling. Denies fever, chills. Is able to move jaw. He is out of town in [place], [place], as he has to attend a funeral and will not be able to see dentist until next week.  ? ? ?HPI: HPI  ?Problems:  ?Patient Active Problem List  ? Diagnosis Date Noted  ? Erectile dysfunction 04/21/2021  ? Obesity, unspecified 08/20/2019  ? Ex-smoker 08/20/2019  ? GERD (gastroesophageal reflux disease) 08/20/2019  ? Elevated LFTs 08/20/2019  ? Angina pectoris ([place])   ? Chest pain 09/17/2015  ? Type 2 diabetes mellitus with hyperglycemia, with long-term current use of insulin ([place]) 01/15/2013  ? Hypercholesterolemia 01/15/2013  ? Renal insufficiency 07/26/2012  ? Hyponatremia 07/26/2012  ? Essential (primary) hypertension 07/26/2012  ? Left inguinal hernia 05/19/2011  ?  ?Allergies:  ?Allergies  ?Allergen Reactions  ? Metformin And Related Diarrhea  ? ?  Medications:  ?Current Outpatient Medications:  ?  Continuous Blood Gluc Sensor (FREESTYLE LIBRE 2 SENSOR) MISC, Utilize as recommended to monitor blood glucose, Disp: 2 each, Rfl: 5 ?  gabapentin (NEURONTIN) 300 MG capsule, Take 1 capsule (300 mg total) by mouth at bedtime., Disp: 90 capsule, Rfl: 0 ?  ibuprofen (ADVIL) 600 MG tablet, Take 1 tablet (600 mg total) by mouth every 8 (eight) hours as needed., Disp: 30 tablet, Rfl: 0 ?  insulin NPH-regular Human (HUMULIN 70/30) (70-30) 100 UNIT/ML injection, Inject 48 Units into the skin 2 (two) times daily  with a meal., Disp: 30 mL, Rfl: 2 ?  lisinopril (ZESTRIL) 10 MG tablet, Take 1 tablet (10 mg total) by mouth daily., Disp: 90 tablet, Rfl: 1 ?  omeprazole (PRILOSEC) 20 MG capsule, Take by mouth., Disp: , Rfl:  ?  rosuvastatin (CRESTOR) 20 MG tablet, Take 1 tablet (20 mg total) by mouth daily., Disp: 90 tablet, Rfl: 1 ?  Semaglutide,0.25 or 0.5MG /DOS, (OZEMPIC, 0.25 OR 0.5 MG/DOSE,) 2 MG/1.5ML SOPN, Inject 0.25 mg into the skin once a week., Disp: 1.5 mL, Rfl: 1 ?  sildenafil (VIAGRA) 100 MG tablet, Take 0.5-1 tablets (50-100 mg total) by mouth daily as needed for erectile dysfunction., Disp: 10 tablet, Rfl: 11 ? ?Current Facility-Administered Medications:  ?  0.9 %  sodium chloride infusion, 500 mL, Intravenous, Continuous, Milus Banister, MD ? ?Observations/Objective: ?Patient is well-developed, well-nourished in no acute distress.  ?Resting comfortably at home.  ?Head is normocephalic, atraumatic.  ?No labored breathing. ?Speech is clear and coherent with logical content.  ?Patient is alert and oriented at baseline.  ? ?Assessment and Plan: ?1. Dental abscess ? ?Afebrile. Prior history albeit of different location. Will start Augmentin BID. Alternate Tylenol and ibuprofen. Follow-up with dentist next week. Strict UC/ER precautions reviewed. Patient voiced understanding.  ? ?Follow Up Instructions: ?I discussed the assessment and treatment plan with the patient. The patient was provided an opportunity to ask questions and all were answered. The patient agreed with the plan and demonstrated an understanding of the instructions.  A copy of instructions were sent to the patient via MyChart unless otherwise noted below.  ? ?The patient was advised to call back or seek an in-person evaluation if the symptoms worsen or if the condition fails to improve as anticipated. ? ?Time:  ?I spent 10 minutes with the patient via telehealth technology discussing the above problems/concerns.   ? ?Leeanne Rio, PA-C ?

## 2021-07-14 NOTE — Patient Instructions (Signed)
?Bradley Howe, thank you for joining Leeanne Rio, PA-C for today's virtual visit.  While this provider is not your primary care provider (PCP), if your PCP is located in our provider database this encounter information will be shared with them immediately following your visit. ? ?Consent: ?(Patient) Bradley Howe provided verbal consent for this virtual visit at the beginning of the encounter. ? ?Current Medications: ? ?Current Outpatient Medications:  ?  amoxicillin-clavulanate (AUGMENTIN) 875-125 MG tablet, Take 1 tablet by mouth 2 (two) times daily., Disp: 20 tablet, Rfl: 0 ?  Continuous Blood Gluc Sensor (FREESTYLE LIBRE 2 SENSOR) MISC, Utilize as recommended to monitor blood glucose, Disp: 2 each, Rfl: 5 ?  gabapentin (NEURONTIN) 300 MG capsule, Take 1 capsule (300 mg total) by mouth at bedtime., Disp: 90 capsule, Rfl: 0 ?  ibuprofen (ADVIL) 600 MG tablet, Take 1 tablet (600 mg total) by mouth every 8 (eight) hours as needed., Disp: 30 tablet, Rfl: 0 ?  insulin NPH-regular Human (HUMULIN 70/30) (70-30) 100 UNIT/ML injection, Inject 48 Units into the skin 2 (two) times daily with a meal., Disp: 30 mL, Rfl: 2 ?  lisinopril (ZESTRIL) 10 MG tablet, Take 1 tablet (10 mg total) by mouth daily., Disp: 90 tablet, Rfl: 1 ?  omeprazole (PRILOSEC) 20 MG capsule, Take by mouth., Disp: , Rfl:  ?  rosuvastatin (CRESTOR) 20 MG tablet, Take 1 tablet (20 mg total) by mouth daily., Disp: 90 tablet, Rfl: 1 ?  Semaglutide,0.25 or 0.5MG /DOS, (OZEMPIC, 0.25 OR 0.5 MG/DOSE,) 2 MG/1.5ML SOPN, Inject 0.25 mg into the skin once a week., Disp: 1.5 mL, Rfl: 1 ?  sildenafil (VIAGRA) 100 MG tablet, Take 0.5-1 tablets (50-100 mg total) by mouth daily as needed for erectile dysfunction., Disp: 10 tablet, Rfl: 11 ? ?Current Facility-Administered Medications:  ?  0.9 %  sodium chloride infusion, 500 mL, Intravenous, Continuous, Milus Banister, MD  ? ?Medications ordered in this encounter:  ?Meds ordered this encounter  ?Medications  ?  amoxicillin-clavulanate (AUGMENTIN) 875-125 MG tablet  ?  Sig: Take 1 tablet by mouth 2 (two) times daily.  ?  Dispense:  20 tablet  ?  Refill:  0  ?  Order Specific Question:   Supervising Provider  ?  Answer:   Noemi Chapel [[id number]]  ?  ? ?*If you need refills on other medications prior to your next appointment, please contact your pharmacy* ? ?Follow-Up: ?Call back or seek an in-person evaluation if the symptoms worsen or if the condition fails to improve as anticipated. ? ?Other Instructions ?Please keep hydrated. ?Take the antibiotic as directed with food. ?I recommend starting a daily probiotic as well. ?You can alternate Tylenol and Ibuprofen as discussed for pain. ?Follow-up with dentist next week. ?If anything worsens despite treatment, especially if you are out of town, you need to be evaluated at local Urgent Care or ER.  ? ? ?If you have been instructed to have an in-person evaluation today at a local Urgent Care facility, please use the link below. It will take you to a list of all of our available [facility], including address, phone number and hours of operation. Please do not delay care.  ?[facility] ? ?If you or a family member do not have a primary care provider, use the link below to schedule a visit and establish care. When you choose a [facility] primary care physician or advanced practice provider, you gain a long-term partner in health. ?Find a Primary Care Provider ? ?  Learn more about [facility]'s in-office and virtual care options: ?[facility]  ?

## 2021-07-24 NOTE — Progress Notes (Signed)
? ?S:    ?PCP: Dr. Redmond Pulling  ? ?Patient arrives in good spirits. Presents for diabetes evaluation, education, and management. Patient was referred and last seen by Primary Care Provider on 05/26/2021. A1c is 10.7 (04/19/2022). At last CPP visit 06/17/21, Ozempic was started and 70-30 decreased.  ? ?Today, patient reports tolerating Ozempic well with no missed doses. He denies any adverse effects. Notes that he has decreased his portion sizes because of having a slightly smaller appetite since starting Ozempic. Due for updated labs today.  ? ?Diabetes was diagnosed ~10 years ago. Patient reported at the time he was symptomatic and went to the hospital. He was started on Lantus. Reported Lantus worked well for him, however, it became too expensive over time and that is why he is taking 70/30 insulin. He cannot tolerate metformin. Denies any hx of ACS, stroke, CHF, or CKD. No thyroid cancer. No pancreatitis.  ? ?PMH: HTN, obesity, T2DM, HLD, and GERD. ? ?Family/Social History:  ?-Fhx: DM  ?-Tobacco: never smoker  ?-Alcohol: endorses occasional use ? ?Insurance coverage/medication affordability: [employer]a ? ?Medication adherence reported.   ?Current diabetes medications include: insulin NPH 70/30 48 units BID, Ozempic 0.25 mg weekly (Sundays) ?Current hypertension medications include: lisinopril 10 mg daily  ?Current hyperlipidemia medications include: rosuvastatin 20 mg daily  ? ?Patient denies hypoglycemic events.  ? ?Patient reported dietary habits: trying to drink more water, decreasing portion sizes ? ?Patient-reported exercise habits: no formal exercise outside of work - walks all day at work ? ?Patient denies polyuria (nighttime urination).  ?Patient reports neuropathy (nerve pain).  ?Patient reports visual changes in the last couple months. Reports he needs to get back with his eye doctor.  ?Patient reports self foot exams.  ? ?AM fasting BG per patient report: 109, 98, 116 ? ?O:  ?Lab Results  ?Component Value Date  ?  HGBA1C 10.7 (A) 04/19/2021  ? ?Lipid Panel  ?   ?Component Value Date/Time  ? CHOL 203 (H) 01/10/2021 1724  ? TRIG 431 (H) 01/10/2021 1724  ? HDL 30 (L) 01/10/2021 1724  ? CHOLHDL 6.8 (H) 01/10/2021 1724  ? CHOLHDL 6.0 09/17/2015 0906  ? VLDL 64 (H) 09/17/2015 0906  ? [place] 100 (H) 01/10/2021 1724  ? ?Brings his libre meter for review. Download shows the following: ?Time in target:  ?70-180: 50% ?181-240: 39% ?>240: 11% ? ?Clinical Atherosclerotic Cardiovascular Disease (ASCVD): No  ?The 10-year ASCVD risk score (Arnett DK, et al., 2019) is: 23.1% ?  Values used to calculate the score: ?    Age: 56 years ?    Sex: Male ?    Is Non-Hispanic African American: Yes ?    Diabetic: Yes ?    Tobacco smoker: No ?    Systolic Blood Pressure: 737 mmHg ?    Is BP treated: Yes ?    HDL Cholesterol: 30 mg/dL ?    Total Cholesterol: 203 mg/dL  ? ?A/P:  ?Diabetes longstanding currently uncontrolled with last A1c 10.7 (04/19/21) but with home BG improving per [place] review. Patient is able to verbalize appropriate hypoglycemia management plan. Medication adherence appears appropriate. Will continue to increase Ozempic and decrease insulin.  ?-Increase Ozempic to 0.5 mg weekly.  ?-Decreased dose of Humulin 70/30 to 46 units BID.  ?-Extensively discussed pathophysiology of diabetes, recommended lifestyle interventions, dietary effects on blood sugar control ?-Counseled on s/sx of and management of hypoglycemia ?-Next A1C - drawn today. Also due for updated CMET and UACR.   ? ?ASCVD risk -  primary prevention in patient with diabetes. Last LDL is not controlled. ASCVD risk score is >20%  - high intensity statin indicated.  ?-Continued rosuvastatin 20 mg.  ?-Lipid panel today.  ? ?Written patient instructions provided. Total time in face to face counseling 25 minutes. Follow up PCP Clinic Visit in 1 month.  ? ?Rebbeca Paul, PharmD ?PGY2 Ambulatory Care Pharmacy Resident ?07/25/2021 9:10 AM ? ?

## 2021-07-25 ENCOUNTER — Ambulatory Visit: Payer: Managed Care, Other (non HMO) | Attending: Family Medicine | Admitting: Pharmacist

## 2021-07-25 ENCOUNTER — Other Ambulatory Visit: Payer: Self-pay

## 2021-07-25 ENCOUNTER — Encounter: Payer: Self-pay | Admitting: Family Medicine

## 2021-07-25 ENCOUNTER — Encounter: Payer: Self-pay | Admitting: Pharmacist

## 2021-07-25 DIAGNOSIS — Z794 Long term (current) use of insulin: Secondary | ICD-10-CM

## 2021-07-25 DIAGNOSIS — E1165 Type 2 diabetes mellitus with hyperglycemia: Secondary | ICD-10-CM

## 2021-07-25 MED ORDER — OZEMPIC (0.25 OR 0.5 MG/DOSE) 2 MG/3ML ~~LOC~~ SOPN
0.5000 mg | PEN_INJECTOR | SUBCUTANEOUS | 2 refills | Status: DC
  Filled 2021-07-25: qty 1.5, 28d supply, fill #0
  Filled 2021-10-11: qty 3, 28d supply, fill #0

## 2021-07-25 MED ORDER — HUMULIN 70/30 (70-30) 100 UNIT/ML ~~LOC~~ SUSP
46.0000 [IU] | Freq: Two times a day (BID) | SUBCUTANEOUS | 2 refills | Status: DC
  Filled 2021-07-25: qty 20, 22d supply, fill #0
  Filled 2022-01-11: qty 30, 30d supply, fill #0
  Filled 2022-01-29: qty 30, 30d supply, fill #1
  Filled 2022-02-27: qty 30, 30d supply, fill #2

## 2021-07-26 LAB — CMP14+EGFR
ALT: 38 IU/L (ref 0–44)
AST: 32 IU/L (ref 0–40)
Albumin/Globulin Ratio: 1.7 (ref 1.2–2.2)
Albumin: 4.3 g/dL (ref 3.8–4.9)
Alkaline Phosphatase: 63 IU/L (ref 44–121)
BUN/Creatinine Ratio: 8 — ABNORMAL LOW (ref 9–20)
BUN: 7 mg/dL (ref 6–24)
Bilirubin Total: 0.5 mg/dL (ref 0.0–1.2)
CO2: 24 mmol/L (ref 20–29)
Calcium: 9.1 mg/dL (ref 8.7–10.2)
Chloride: 101 mmol/L (ref 96–106)
Creatinine, Ser: 0.92 mg/dL (ref 0.76–1.27)
Globulin, Total: 2.5 g/dL (ref 1.5–4.5)
Glucose: 123 mg/dL — ABNORMAL HIGH (ref 70–99)
Potassium: 3.7 mmol/L (ref 3.5–5.2)
Sodium: 139 mmol/L (ref 134–144)
Total Protein: 6.8 g/dL (ref 6.0–8.5)
eGFR: 97 mL/min/{1.73_m2} (ref >59)

## 2021-07-26 LAB — MICROALBUMIN / CREATININE URINE RATIO
Creatinine, Urine: 534.2 mg/dL
Microalb/Creat Ratio: 11 mg/g creat (ref 0–29)
Microalbumin, Urine: 59.4 ug/mL

## 2021-07-26 LAB — LIPID PANEL
Chol/HDL Ratio: 4.6 ratio (ref 0.0–5.0)
Cholesterol, Total: 172 mg/dL (ref 100–199)
HDL: 37 mg/dL — ABNORMAL LOW (ref >39)
LDL Chol Calc (NIH): 89 mg/dL (ref 0–99)
Triglycerides: 273 mg/dL — ABNORMAL HIGH (ref 0–149)
VLDL Cholesterol Cal: 46 mg/dL — ABNORMAL HIGH (ref 5–40)

## 2021-07-26 LAB — HEMOGLOBIN A1C
Est. average glucose Bld gHb Est-mCnc: 194 mg/dL
Hgb A1c MFr Bld: 8.4 % — ABNORMAL HIGH (ref 4.8–5.6)

## 2021-07-28 ENCOUNTER — Encounter: Payer: Self-pay | Admitting: Family Medicine

## 2021-07-29 ENCOUNTER — Other Ambulatory Visit: Payer: Self-pay | Admitting: Family Medicine

## 2021-07-29 MED ORDER — SILDENAFIL CITRATE 100 MG PO TABS: 50.0000 mg | ORAL_TABLET | Freq: Every day | ORAL | 0 refills | Status: DC | PRN

## 2021-08-01 ENCOUNTER — Other Ambulatory Visit: Payer: Self-pay

## 2021-09-06 ENCOUNTER — Encounter: Payer: Managed Care, Other (non HMO) | Admitting: Family Medicine

## 2021-09-27 ENCOUNTER — Telehealth: Payer: Commercial Managed Care - HMO | Admitting: Physician Assistant

## 2021-09-27 ENCOUNTER — Other Ambulatory Visit: Payer: Self-pay | Admitting: Family Medicine

## 2021-09-27 DIAGNOSIS — A084 Viral intestinal infection, unspecified: Secondary | ICD-10-CM | POA: Diagnosis not present

## 2021-09-27 DIAGNOSIS — E785 Hyperlipidemia, unspecified: Secondary | ICD-10-CM

## 2021-09-27 DIAGNOSIS — E1165 Type 2 diabetes mellitus with hyperglycemia: Secondary | ICD-10-CM

## 2021-09-27 MED ORDER — ONDANSETRON 4 MG PO TBDP: 4.0000 mg | ORAL_TABLET | Freq: Three times a day (TID) | ORAL | 0 refills | Status: DC | PRN

## 2021-09-27 NOTE — Patient Instructions (Signed)
?[name], thank you for joining Leeanne Rio, PA-C for today's virtual visit.  While this provider is not your primary care provider (PCP), if your PCP is located in our provider database this encounter information will be shared with them immediately following your visit. ? ?Consent: ?(Patient) [name] provided verbal consent for this virtual visit at the beginning of the encounter. ? ?Current Medications: ? ?Current Outpatient Medications:  ?  amoxicillin-clavulanate (AUGMENTIN) 875-125 MG tablet, Take 1 tablet by mouth 2 (two) times daily., Disp: 20 tablet, Rfl: 0 ?  Continuous Blood Gluc Sensor (FREESTYLE LIBRE 2 SENSOR) MISC, Utilize as recommended to monitor blood glucose, Disp: 2 each, Rfl: 5 ?  gabapentin (NEURONTIN) 300 MG capsule, Take 1 capsule (300 mg total) by mouth at bedtime., Disp: 90 capsule, Rfl: 0 ?  ibuprofen (ADVIL) 600 MG tablet, Take 1 tablet (600 mg total) by mouth every 8 (eight) hours as needed., Disp: 30 tablet, Rfl: 0 ?  insulin NPH-regular Human (HUMULIN 70/30) (70-30) 100 UNIT/ML injection, Inject 46 Units into the skin 2 (two) times daily with a meal., Disp: 30 mL, Rfl: 2 ?  lisinopril (ZESTRIL) 10 MG tablet, Take 1 tablet (10 mg total) by mouth daily., Disp: 90 tablet, Rfl: 1 ?  omeprazole (PRILOSEC) 20 MG capsule, Take by mouth., Disp: , Rfl:  ?  rosuvastatin (CRESTOR) 20 MG tablet, Take 1 tablet (20 mg total) by mouth daily., Disp: 90 tablet, Rfl: 1 ?  Semaglutide,0.25 or 0.'5MG'$ /DOS, (OZEMPIC, 0.25 OR 0.5 MG/DOSE,) 2 MG/1.5ML SOPN, Inject 0.5 mg into the skin once a week., Disp: 1.5 mL, Rfl: 2 ?  sildenafil (VIAGRA) 100 MG tablet, Take 0.5-1 tablets (50-100 mg total) by mouth daily as needed for erectile dysfunction., Disp: 10 tablet, Rfl: 0 ? ?Current Facility-Administered Medications:  ?  0.9 %  sodium chloride infusion, 500 mL, Intravenous, Continuous, Milus Banister, MD  ? ?Medications ordered in this encounter:  ?No orders of the defined types were placed in  this encounter. ?  ?*If you need refills on other medications prior to your next appointment, please contact your pharmacy* ? ?Follow-Up: ?Call back or seek an in-person evaluation if the symptoms worsen or if the condition fails to improve as anticipated. ? ?Other Instructions ?Please keep well-hydrated and get plenty of rest. ?Follow the dietary recommendations below.  ?You can use OTC Imodium if needed for frequent stools. ?The Zofran is to use as directed, if needed, for nausea/vomiting. ?If you note any acutely worsening symptoms or are unable to keep hydrated, you need evaluation in person at Urgent Care or ER.  ? ?Bland Diet ?A bland diet may consist of soft foods or foods that are not high in fat or are not greasy, acidic, or spicy. Avoiding certain foods may cause less irritation to your mouth, throat, stomach, or gastrointestinal tract. Avoiding certain foods may make you feel better. Everyone's tolerances are different. A bland diet should be based on what you can tolerate and what may cause discomfort. ?What is my plan? ?Your health care provider or dietitian may recommend specific changes to your diet to treat your symptoms. These changes may include: ?Eating small meals frequently. ?Cooking food until it is soft enough to chew easily. ?Taking the time to chew your food thoroughly, so it is easy to swallow and digest. ?Avoiding foods that cause you discomfort. These may include spicy food, fried food, greasy foods, hard-to-chew foods, or citrus fruits and juices. ?Drinking slowly. ?What are tips for following this plan? ?  Reading food labels ?To reduce fiber intake, look for food labels that say "whole," such as whole wheat or whole grain. ?Shopping ?Avoid food items that may have nuts or seeds. ?Avoid vegetables that may make you gassy or have a tough texture, such as broccoli, cauliflower, or corn. ?Cooking ?Lacinda Axon foods thoroughly so they have a soft texture. ?Meal planning ?Make sure you include foods  from all food groups to eat a balanced diet. ?Eat a variety of types of foods. ?Eat foods and drink beverages that do not cause you discomfort. These may include soups and broths with cooked meats, pasta, and vegetables. ?Lifestyle ?Sit up after meals, avoid tight clothing, and take time to eat and chew your food slowly. ?Ask your health care provider whether you should take dietary supplements. ?General information ?Mildly season your foods. Some seasonings, such as cayenne pepper, vinegar, or hot sauce, may cause irritation. ?The foods, beverages, or seasonings to avoid should be based on individual tolerance. ?What foods should I eat? ?Fruits ?Canned or cooked fruit such as peaches, pears, or applesauce. Bananas. ?Vegetables ?Well-cooked vegetables. Canned or cooked vegetables such as carrots, green beans, beets, or spinach. Mashed or boiled potatoes. ?Grains ? ?Hot cereals, such as cream of wheat and processed oatmeal. Rice. Bread, crackers, pasta, or tortillas made from refined white flour. ?Meats and other proteins ? ?Eggs. Creamy peanut butter or other nut butters. Lean, well-cooked tender meats, such as beef, pork, chicken, or fish. ?Dairy ?Low-fat dairy products such as milk, cottage cheese, or yogurt. ?Beverages ? ?Water. Herbal tea. Apple juice. ?Fats and oils ?Mild salad dressings. Canola or olive oil. ?Sweets and desserts ?Low-fat pudding, custard, or ice cream. Fruit gelatin. ?The items listed above may not be a complete list of foods and beverages you can eat. Contact a dietitian for more information. ?What foods should I avoid? ?Fruits ?Citrus fruits, such as oranges and grapefruit. Fruits with a stringy texture. Fruits that have lots of seeds, such as kiwi or strawberries. Dried fruits. ?Vegetables ?Raw, uncooked vegetables. Salads. ?Grains ?Whole grain breads, muffins, and cereals. ?Meats and other proteins ?Tough, fibrous meats. Highly seasoned meat such as corned beef, smoked meats, or fish.  Processed high-fat meats such as brats, hot dogs, or sausage. ?Dairy ?Full-fat dairy foods such as ice cream and cheese. ?Beverages ?Caffeinated drinks. Alcohol. ?Seasonings and condiments ?Strongly flavored seasonings or condiments. Hot sauce. Salsa. ?Other foods ?Spicy foods. Fried or greasy foods. Sour foods, such as pickled or fermented foods like sauerkraut. Foods high in fiber. ?The items listed above may not be a complete list of foods and beverages you should avoid. Contact a dietitian for more information. ?Summary ?A bland diet should be based on individual tolerance. It may consist of foods that are soft textured and do not have a lot of fat, fiber, acid, or seasonings. ?A bland diet may be recommended because avoiding certain foods, beverages, or spices may make you feel better. ?This information is not intended to replace advice given to you by your health care provider. Make sure you discuss any questions you have with your health care provider. ?Document Revised: 03/21/2021 Document Reviewed: 03/21/2021 ?Elsevier Patient Education ? [place]. ? ? ? ?If you have been instructed to have an in-person evaluation today at a local Urgent Care facility, please use the link below. It will take you to a list of all of our available [facility], including address, phone number and hours of operation. Please do not delay care.  ?  [facility] ? ?If you or a family member do not have a primary care provider, use the link below to schedule a visit and establish care. When you choose a [facility] primary care physician or advanced practice provider, you gain a long-term partner in health. ?Find a Primary Care Provider ? ?Learn more about [facility]'s in-office and virtual care options: ?[facility]  ?

## 2021-09-27 NOTE — Progress Notes (Signed)
?Virtual Visit Consent  ? ?Bradley Howe, you are scheduled for a virtual visit with a [facility] provider today. Just as with appointments in the office, your consent must be obtained to participate. Your consent will be active for this visit and any virtual visit you may have with one of our providers in the next 365 days. If you have a MyChart account, a copy of this consent can be sent to you electronically. ? ?As this is a virtual visit, video technology does not allow for your provider to perform a traditional examination. This may limit your provider's ability to fully assess your condition. If your provider identifies any concerns that need to be evaluated in person or the need to arrange testing (such as labs, EKG, etc.), we will make arrangements to do so. Although advances in technology are sophisticated, we cannot ensure that it will always work on either your end or our end. If the connection with a video visit is poor, the visit may have to be switched to a telephone visit. With either a video or telephone visit, we are not always able to ensure that we have a secure connection. ? ?By engaging in this virtual visit, you consent to the provision of healthcare and authorize for your insurance to be billed (if applicable) for the services provided during this visit. Depending on your insurance coverage, you may receive a charge related to this service. ? ?I need to obtain your verbal consent now. Are you willing to proceed with your visit today? Bradley Howe has provided verbal consent on 09/27/2021 for a virtual visit (video or telephone). Bradley Rio, PA-C ? ?Date: 09/27/2021 7:40 AM ? ?Virtual Visit via Video Note  ? ?ILeeanne Howe, connected with  Bradley Howe  ([id number], Oct 11, 1965) on 09/27/21 at  7:30 AM EDT by a video-enabled telemedicine application and verified that I am speaking with the correct person using two identifiers. ? ?Location: ?Patient: Virtual Visit Location Patient:  Home ?Provider: Virtual Visit Location Provider: Home Office ?  ?I discussed the limitations of evaluation and management by telemedicine and the availability of in person appointments. The patient expressed understanding and agreed to proceed.   ? ?History of Present Illness: ?Bradley Howe is a 56 y.o. who identifies as a male who was assigned male at birth, and is being seen today for GI symptoms starting Sunday as the day went on. Has notes low-grade fever (Tmax 100.5). A few episodes of emesis yesterday only, none today. Watery, non-bloody stools. Some abdominal cramping. Denies recent travel. Denies sick contact. Denies abnormal food or water source. Did have food at a family get together this past weekend.  ? ?Is trying to keep hydrated. No issue since the vomiting resolved.  ? ?HPI: HPI  ?Problems:  ?Patient Active Problem List  ? Diagnosis Date Noted  ? Erectile dysfunction 04/21/2021  ? Obesity, unspecified 08/20/2019  ? Ex-smoker 08/20/2019  ? GERD (gastroesophageal reflux disease) 08/20/2019  ? Elevated LFTs 08/20/2019  ? Angina pectoris (Bradley Howe)   ? Chest pain 09/17/2015  ? Type 2 diabetes mellitus with hyperglycemia, with long-term current use of insulin (Bradley Howe) 01/15/2013  ? Hypercholesterolemia 01/15/2013  ? Renal insufficiency 07/26/2012  ? Hyponatremia 07/26/2012  ? Essential (primary) hypertension 07/26/2012  ? Left inguinal hernia 05/19/2011  ?  ?Allergies:  ?Allergies  ?Allergen Reactions  ? Metformin And Related Diarrhea  ? ?Medications:  ?Current Outpatient Medications:  ?  ondansetron (ZOFRAN-ODT) 4 MG disintegrating tablet, Take 1 tablet (4 mg  total) by mouth every 8 (eight) hours as needed for nausea or vomiting., Disp: 20 tablet, Rfl: 0 ?  Continuous Blood Gluc Sensor (FREESTYLE LIBRE 2 SENSOR) MISC, Utilize as recommended to monitor blood glucose, Disp: 2 each, Rfl: 5 ?  gabapentin (NEURONTIN) 300 MG capsule, Take 1 capsule (300 mg total) by mouth at bedtime., Disp: 90 capsule, Rfl: 0 ?   ibuprofen (ADVIL) 600 MG tablet, Take 1 tablet (600 mg total) by mouth every 8 (eight) hours as needed., Disp: 30 tablet, Rfl: 0 ?  insulin NPH-regular Human (HUMULIN 70/30) (70-30) 100 UNIT/ML injection, Inject 46 Units into the skin 2 (two) times daily with a meal., Disp: 30 mL, Rfl: 2 ?  lisinopril (ZESTRIL) 10 MG tablet, Take 1 tablet (10 mg total) by mouth daily., Disp: 90 tablet, Rfl: 1 ?  omeprazole (PRILOSEC) 20 MG capsule, Take by mouth., Disp: , Rfl:  ?  rosuvastatin (CRESTOR) 20 MG tablet, Take 1 tablet (20 mg total) by mouth daily., Disp: 90 tablet, Rfl: 1 ?  Semaglutide,0.25 or 0.'5MG'$ /DOS, (OZEMPIC, 0.25 OR 0.5 MG/DOSE,) 2 MG/1.5ML SOPN, Inject 0.5 mg into the skin once a week., Disp: 1.5 mL, Rfl: 2 ?  sildenafil (VIAGRA) 100 MG tablet, Take 0.5-1 tablets (50-100 mg total) by mouth daily as needed for erectile dysfunction., Disp: 10 tablet, Rfl: 0 ? ?Current Facility-Administered Medications:  ?  0.9 %  sodium chloride infusion, 500 mL, Intravenous, Continuous, Milus Banister, MD ? ?Observations/Objective: ?Patient is well-developed, well-nourished in no acute distress.  ?Resting comfortably at home.  ?Head is normocephalic, atraumatic.  ?No labored breathing. ?Speech is clear and coherent with logical content.  ?Patient is alert and oriented at baseline.  ? ?Assessment and Plan: ?1. Viral gastroenteritis ?- ondansetron (ZOFRAN-ODT) 4 MG disintegrating tablet; Take 1 tablet (4 mg total) by mouth every 8 (eight) hours as needed for nausea or vomiting.  Dispense: 20 tablet; Refill: 0 ? ?Borderline fever but no bloody or mucoid stools. Emesis has stopped in past 24 hours. Hydrating well. Will have him continue good PO hydration. Start Imodium OTC as needed for frequent stools. Start [employer]. Zofran per orders. Strict ER precautions reviewed with patient who voiced understanding and agreement with the plan.  ? ?Follow Up Instructions: ?I discussed the assessment and treatment plan with the patient. The  patient was provided an opportunity to ask questions and all were answered. The patient agreed with the plan and demonstrated an understanding of the instructions.  A copy of instructions were sent to the patient via MyChart unless otherwise noted below.  ? ?The patient was advised to call back or seek an in-person evaluation if the symptoms worsen or if the condition fails to improve as anticipated. ? ?Time:  ?I spent 10 minutes with the patient via telehealth technology discussing the above problems/concerns.   ? ?Bradley Rio, PA-C ?

## 2021-09-27 NOTE — Telephone Encounter (Addendum)
Pt called in to request a refill for 2 medications  ? ?rosuvastatin (CRESTOR) 20 MG tablet ?lisinopril (ZESTRIL) 10 MG tablet  ? ? ?Pharmacy: [facility], [address], [place] [address]  ?Phone:  [phone] ? ?Next ov: 11/18/21 ? ? ?

## 2021-09-28 MED ORDER — LISINOPRIL 10 MG PO TABS: 10.0000 mg | ORAL_TABLET | Freq: Every day | ORAL | 0 refills | Status: DC

## 2021-09-28 MED ORDER — ROSUVASTATIN CALCIUM 20 MG PO TABS: 20.0000 mg | ORAL_TABLET | Freq: Every day | ORAL | 1 refills | Status: DC

## 2021-09-28 NOTE — Telephone Encounter (Signed)
Requested Prescriptions  ?Pending Prescriptions Disp Refills  ?? lisinopril (ZESTRIL) 10 MG tablet 90 tablet 0  ?  Sig: Take 1 tablet (10 mg total) by mouth daily.  ?  ? Cardiovascular:  ACE Inhibitors Passed - 09/27/2021  3:23 PM  ?  ?  Passed - Cr in normal range and within 180 days  ?  Creat  ?Date Value Ref Range Status  ?01/15/2013 1.36 (H) 0.50 - 1.35 mg/dL Final  ? ?Creatinine, Ser  ?Date Value Ref Range Status  ?07/25/2021 0.92 0.76 - 1.27 mg/dL Final  ?   ?  ?  Passed - K in normal range and within 180 days  ?  Potassium  ?Date Value Ref Range Status  ?07/25/2021 3.7 3.5 - 5.2 mmol/L Final  ?   ?  ?  Passed - Patient is not pregnant  ?  ?  Passed - Last BP in normal range  ?  BP Readings from Last 1 Encounters:  ?04/19/21 123/78  ?   ?  ?  Passed - Valid encounter within last 6 months  ?  Recent Outpatient Visits   ?      ? 2 months ago Type 2 diabetes mellitus with hyperglycemia, with long-term current use of insulin ([place])  ? [place], RPH-CPP  ? 3 months ago Type 2 diabetes mellitus with hyperglycemia, with long-term current use of insulin ([place])  ? [place], RPH-CPP  ? 4 months ago Type 2 diabetes mellitus with hyperglycemia, with long-term current use of insulin ([place])  ? Primary Care at [facility], MD  ? 5 months ago Type 2 diabetes mellitus with hyperglycemia, with long-term current use of insulin ([place])  ? Primary Care at [facility], MD  ? 8 months ago Type 2 diabetes mellitus with hyperglycemia, with long-term current use of insulin ([place])  ? Primary Care at [facility] MD  ?  ?  ?Future Appointments   ?        ? In 1 month Dorna Mai, MD Primary Care at [facility]  ?  ? ?  ?  ?  ?? rosuvastatin (CRESTOR) 20 MG tablet 90 tablet 1  ?  Sig: Take 1 tablet (20 mg total) by mouth daily.  ?  ? Cardiovascular:  Antilipid - Statins 2  Failed - 09/27/2021  3:23 PM  ?  ?  Failed - Lipid Panel in normal range within the last 12 months  ?  Cholesterol, Total  ?Date Value Ref Range Status  ?07/25/2021 172 100 - 199 mg/dL Final  ? ?LDL Chol Calc (NIH)  ?Date Value Ref Range Status  ?07/25/2021 89 0 - 99 mg/dL Final  ? ?HDL  ?Date Value Ref Range Status  ?07/25/2021 37 (L) >39 mg/dL Final  ? ?Triglycerides  ?Date Value Ref Range Status  ?07/25/2021 273 (H) 0 - 149 mg/dL Final  ? ?  ?  ?  Passed - Cr in normal range and within 360 days  ?  Creat  ?Date Value Ref Range Status  ?01/15/2013 1.36 (H) 0.50 - 1.35 mg/dL Final  ? ?Creatinine, Ser  ?Date Value Ref Range Status  ?07/25/2021 0.92 0.76 - 1.27 mg/dL Final  ?   ?  ?  Passed - Patient is not pregnant  ?  ?  Passed - Valid encounter within last 12 months  ?  Recent Outpatient  Visits   ?      ? 2 months ago Type 2 diabetes mellitus with hyperglycemia, with long-term current use of insulin ([place])  ? [place], RPH-CPP  ? 3 months ago Type 2 diabetes mellitus with hyperglycemia, with long-term current use of insulin ([place])  ? [place], RPH-CPP  ? 4 months ago Type 2 diabetes mellitus with hyperglycemia, with long-term current use of insulin ([place])  ? Primary Care at [facility], MD  ? 5 months ago Type 2 diabetes mellitus with hyperglycemia, with long-term current use of insulin ([place])  ? Primary Care at [facility], MD  ? 8 months ago Type 2 diabetes mellitus with hyperglycemia, with long-term current use of insulin ([place])  ? Primary Care at [facility], MD  ?  ?  ?Future Appointments   ?        ? In 1 month Dorna Mai, MD Primary Care at [facility]  ?  ? ?  ?  ?  ? ? ?

## 2021-10-09 ENCOUNTER — Telehealth: Payer: Commercial Managed Care - HMO | Admitting: Family

## 2021-10-09 DIAGNOSIS — K047 Periapical abscess without sinus: Secondary | ICD-10-CM | POA: Diagnosis not present

## 2021-10-09 DIAGNOSIS — K0889 Other specified disorders of teeth and supporting structures: Secondary | ICD-10-CM | POA: Diagnosis not present

## 2021-10-09 MED ORDER — AMOXICILLIN-POT CLAVULANATE 875-125 MG PO TABS: 1.0000 | ORAL_TABLET | Freq: Two times a day (BID) | ORAL | 0 refills | Status: DC

## 2021-10-09 NOTE — Progress Notes (Signed)
Virtual Visit Consent   Bradley Howe, you are scheduled for a virtual visit with a [facility] provider today. Just as with appointments in the office, your consent must be obtained to participate. Your consent will be active for this visit and any virtual visit you may have with one of our providers in the next 365 days. If you have a MyChart account, a copy of this consent can be sent to you electronically.  As this is a virtual visit, video technology does not allow for your provider to perform a traditional examination. This may limit your provider's ability to fully assess your condition. If your provider identifies any concerns that need to be evaluated in person or the need to arrange testing (such as labs, EKG, etc.), we will make arrangements to do so. Although advances in technology are sophisticated, we cannot ensure that it will always work on either your end or our end. If the connection with a video visit is poor, the visit may have to be switched to a telephone visit. With either a video or telephone visit, we are not always able to ensure that we have a secure connection.  By engaging in this virtual visit, you consent to the provision of healthcare and authorize for your insurance to be billed (if applicable) for the services provided during this visit. Depending on your insurance coverage, you may receive a charge related to this service.  I need to obtain your verbal consent now. Are you willing to proceed with your visit today? Bradley Howe has provided verbal consent on 10/09/2021 for a virtual visit (video or telephone). Bradley Dun, FNP  Date: 10/09/2021 10:29 AM  Virtual Visit via Video Note   I, Bradley Howe, connected with  Bradley Howe  ([id number], [birth year]) on 10/09/21 at 10:30 AM EDT by a video-enabled telemedicine application and verified that I am speaking with the correct person using two identifiers.  Location: Patient: Virtual Visit Location Patient:  Home Provider: Virtual Visit Location Provider: Home Office   I discussed the limitations of evaluation and management by telemedicine and the availability of in person appointments. The patient expressed understanding and agreed to proceed.    History of Present Illness: Bradley Howe is a 56 y.o. who identifies as a male who was assigned male at birth, and is being seen today for gum swelling left upper tooth pain. States he has a broken tooth in that area. He has taken motrin with mild relief. He has dentist and had a tooth pulled last month on his right side.   HPI: HPI  Problems:  Patient Active Problem List   Diagnosis Date Noted   Erectile dysfunction 04/21/2021   Obesity, unspecified 08/20/2019   Ex-smoker 08/20/2019   GERD (gastroesophageal reflux disease) 08/20/2019   Elevated LFTs 08/20/2019   Angina pectoris ([place])    Chest pain 09/17/2015   Type 2 diabetes mellitus with hyperglycemia, with long-term current use of insulin ([place]) 01/15/2013   Hypercholesterolemia 01/15/2013   Renal insufficiency 07/26/2012   Hyponatremia 07/26/2012   Essential (primary) hypertension 07/26/2012   Left inguinal hernia 05/19/2011    Allergies:  Allergies  Allergen Reactions   Metformin And Related Diarrhea   Medications:  Current Outpatient Medications:    amoxicillin-clavulanate (AUGMENTIN) 875-125 MG tablet, Take 1 tablet by mouth 2 (two) times daily., Disp: 20 tablet, Rfl: 0   Continuous Blood Gluc Sensor (FREESTYLE LIBRE 2 SENSOR) MISC, Utilize as recommended to monitor blood glucose, Disp: 2 each, Rfl: 5  gabapentin (NEURONTIN) 300 MG capsule, Take 1 capsule (300 mg total) by mouth at bedtime., Disp: 90 capsule, Rfl: 0   ibuprofen (ADVIL) 600 MG tablet, Take 1 tablet (600 mg total) by mouth every 8 (eight) hours as needed., Disp: 30 tablet, Rfl: 0   insulin NPH-regular Human (HUMULIN 70/30) (70-30) 100 UNIT/ML injection, Inject 46 Units into the skin 2 (two) times daily with a meal.,  Disp: 30 mL, Rfl: 2   lisinopril (ZESTRIL) 10 MG tablet, Take 1 tablet (10 mg total) by mouth daily., Disp: 90 tablet, Rfl: 0   omeprazole (PRILOSEC) 20 MG capsule, Take by mouth., Disp: , Rfl:    ondansetron (ZOFRAN-ODT) 4 MG disintegrating tablet, Take 1 tablet (4 mg total) by mouth every 8 (eight) hours as needed for nausea or vomiting., Disp: 20 tablet, Rfl: 0   rosuvastatin (CRESTOR) 20 MG tablet, Take 1 tablet (20 mg total) by mouth daily., Disp: 90 tablet, Rfl: 1   Semaglutide,0.25 or 0.'5MG'$ /DOS, (OZEMPIC, 0.25 OR 0.5 MG/DOSE,) 2 MG/1.5ML SOPN, Inject 0.5 mg into the skin once a week., Disp: 1.5 mL, Rfl: 2   sildenafil (VIAGRA) 100 MG tablet, Take 0.5-1 tablets (50-100 mg total) by mouth daily as needed for erectile dysfunction., Disp: 10 tablet, Rfl: 0  Current Facility-Administered Medications:    0.9 %  sodium chloride infusion, 500 mL, Intravenous, Continuous, Milus Banister, MD  Observations/Objective: Patient is well-developed, well-nourished in no acute distress.  Resting comfortably  at home.  Head is normocephalic, atraumatic.  No labored breathing.  Speech is clear and coherent with logical content.  Patient is alert and oriented at baseline.  Mild left swelling of jaw Able to talk and swallow with no difficulites  Assessment and Plan: 1. Tooth pain - amoxicillin-clavulanate (AUGMENTIN) 875-125 MG tablet; Take 1 tablet by mouth 2 (two) times daily.  Dispense: 20 tablet; Refill: 0  2. Abscessed tooth - amoxicillin-clavulanate (AUGMENTIN) 875-125 MG tablet; Take 1 tablet by mouth 2 (two) times daily.  Dispense: 20 tablet; Refill: 0  Start Augmentin  Call dentist on Tuesday morning Motrin 600 mg TID  Follow up if symptoms worsen or do not improve  Follow Up Instructions: I discussed the assessment and treatment plan with the patient. The patient was provided an opportunity to ask questions and all were answered. The patient agreed with the plan and demonstrated an  understanding of the instructions.  A copy of instructions were sent to the patient via MyChart unless otherwise noted below.    The patient was advised to call back or seek an in-person evaluation if the symptoms worsen or if the condition fails to improve as anticipated.  Time:  I spent 8 minutes with the patient via telehealth technology discussing the above problems/concerns.    Bradley Dun, FNP

## 2021-10-11 ENCOUNTER — Other Ambulatory Visit (HOSPITAL_BASED_OUTPATIENT_CLINIC_OR_DEPARTMENT_OTHER): Payer: Self-pay

## 2021-11-17 ENCOUNTER — Telehealth: Payer: Commercial Managed Care - HMO | Admitting: Family Medicine

## 2021-11-17 DIAGNOSIS — K0889 Other specified disorders of teeth and supporting structures: Secondary | ICD-10-CM

## 2021-11-17 NOTE — Progress Notes (Signed)
[employer]   Recurrent dental pain with reported swelling in gum area. Recent treatment over the last 30 days, as well as March of this year. In person is recommended  Patient acknowledged agreement and understanding of the plan.

## 2021-11-18 ENCOUNTER — Other Ambulatory Visit: Payer: Self-pay | Admitting: Family Medicine

## 2021-11-18 ENCOUNTER — Encounter: Payer: Commercial Managed Care - HMO | Admitting: Family Medicine

## 2021-11-18 MED ORDER — FREESTYLE LIBRE 2 SENSOR MISC: 5 refills | Status: DC

## 2021-11-18 NOTE — Telephone Encounter (Signed)
Medication Refill - Medication: Continuous Blood Gluc Sensor (FREESTYLE LIBRE 2 SENSOR) MISC [[id number]]   Has the patient contacted their pharmacy? Yes.   (Agent: If no, request that the patient contact the pharmacy for the refill. If patient does not wish to contact the pharmacy document the reason why and proceed with request.) (Agent: If yes, when and what did the pharmacy advise?)  Preferred Pharmacy (with phone number or street name): [place] Has the patient been seen for an appointment in the last year OR does the patient have an upcoming appointment? Yes.    Agent: Please be advised that RX refills may take up to 3 business days. We ask that you follow-up with your pharmacy.

## 2021-11-18 NOTE — Telephone Encounter (Signed)
Requested Prescriptions  Pending Prescriptions Disp Refills  . Continuous Blood Gluc Sensor (FREESTYLE LIBRE 2 SENSOR) MISC 2 each 5    Sig: Utilize as recommended to monitor blood glucose     Endocrinology: Diabetes - Testing Supplies Passed - 11/18/2021 12:47 PM      Passed - Valid encounter within last 12 months    Recent Outpatient Visits          3 months ago Type 2 diabetes mellitus with hyperglycemia, with long-term current use of insulin [facility])   [place], Annie Main L, RPH-CPP   5 months ago Type 2 diabetes mellitus with hyperglycemia, with long-term current use of insulin [facility])   [place], Annie Main L, RPH-CPP   5 months ago Type 2 diabetes mellitus with hyperglycemia, with long-term current use of insulin [facility])   Primary Care at [facility], Clyde Canterbury, MD   7 months ago Type 2 diabetes mellitus with hyperglycemia, with long-term current use of insulin [facility])   Primary Care at [facility], Clyde Canterbury, MD   10 months ago Type 2 diabetes mellitus with hyperglycemia, with long-term current use of insulin [facility])   [facility] at [facility], MD      Future Appointments            In 1 month Dorna Mai, MD Primary Care at [facility]   In 1 month Dorna Mai, MD Primary Care at [facility]

## 2021-12-23 ENCOUNTER — Ambulatory Visit (INDEPENDENT_AMBULATORY_CARE_PROVIDER_SITE_OTHER): Payer: Commercial Managed Care - HMO | Admitting: Family Medicine

## 2021-12-23 ENCOUNTER — Other Ambulatory Visit: Payer: Self-pay

## 2021-12-23 ENCOUNTER — Encounter: Payer: Self-pay | Admitting: Family Medicine

## 2021-12-23 VITALS — BP 154/95 | HR 83 | Temp 97.7°F | Resp 16 | Ht 67.0 in | Wt 227.6 lb

## 2021-12-23 DIAGNOSIS — Z0001 Encounter for general adult medical examination with abnormal findings: Secondary | ICD-10-CM

## 2021-12-23 DIAGNOSIS — Z130 Encounter for screening for diseases of the blood and blood-forming organs and certain disorders involving the immune mechanism: Secondary | ICD-10-CM

## 2021-12-23 DIAGNOSIS — E1140 Type 2 diabetes mellitus with diabetic neuropathy, unspecified: Secondary | ICD-10-CM | POA: Diagnosis not present

## 2021-12-23 DIAGNOSIS — Z13220 Encounter for screening for lipoid disorders: Secondary | ICD-10-CM

## 2021-12-23 DIAGNOSIS — E669 Obesity, unspecified: Secondary | ICD-10-CM | POA: Diagnosis not present

## 2021-12-23 DIAGNOSIS — Z794 Long term (current) use of insulin: Secondary | ICD-10-CM

## 2021-12-23 DIAGNOSIS — E785 Hyperlipidemia, unspecified: Secondary | ICD-10-CM

## 2021-12-23 DIAGNOSIS — Z0000 Encounter for general adult medical examination without abnormal findings: Secondary | ICD-10-CM

## 2021-12-23 DIAGNOSIS — Z6835 Body mass index (BMI) 35.0-35.9, adult: Secondary | ICD-10-CM

## 2021-12-23 LAB — POCT GLYCOSYLATED HEMOGLOBIN (HGB A1C): Hemoglobin A1C: 8 % — AB (ref 4.0–5.6)

## 2021-12-23 MED ORDER — GABAPENTIN 300 MG PO CAPS: 300.0000 mg | ORAL_CAPSULE | Freq: Three times a day (TID) | ORAL | 0 refills | Status: DC

## 2021-12-23 MED ORDER — OZEMPIC (0.25 OR 0.5 MG/DOSE) 2 MG/3ML ~~LOC~~ SOPN
0.5000 mg | PEN_INJECTOR | SUBCUTANEOUS | 2 refills | Status: DC
  Filled 2021-12-23: qty 3, 28d supply, fill #0

## 2021-12-23 NOTE — Progress Notes (Unsigned)
Patient is here for complete physical examination. Patient has no other concerns today 

## 2021-12-24 LAB — CBC WITH DIFFERENTIAL/PLATELET
Basophils Absolute: 0 10*3/uL (ref 0.0–0.2)
Basos: 1 %
EOS (ABSOLUTE): 0.2 10*3/uL (ref 0.0–0.4)
Eos: 3 %
Hematocrit: 40.9 % (ref 37.5–51.0)
Hemoglobin: 14.1 g/dL (ref 13.0–17.7)
Immature Grans (Abs): 0 10*3/uL (ref 0.0–0.1)
Immature Granulocytes: 0 %
Lymphocytes Absolute: 2.2 10*3/uL (ref 0.7–3.1)
Lymphs: 31 %
MCH: 29.3 pg (ref 26.6–33.0)
MCHC: 34.5 g/dL (ref 31.5–35.7)
MCV: 85 fL (ref 79–97)
Monocytes Absolute: 0.4 10*3/uL (ref 0.1–0.9)
Monocytes: 6 %
Neutrophils Absolute: 4.2 10*3/uL (ref 1.4–7.0)
Neutrophils: 59 %
Platelets: 254 10*3/uL (ref 150–450)
RBC: 4.82 x10E6/uL (ref 4.14–5.80)
RDW: 13.1 % (ref 11.6–15.4)
WBC: 7.2 10*3/uL (ref 3.4–10.8)

## 2021-12-24 LAB — CMP14+EGFR
ALT: 34 IU/L (ref 0–44)
AST: 35 IU/L (ref 0–40)
Albumin/Globulin Ratio: 2 (ref 1.2–2.2)
Albumin: 4.7 g/dL (ref 3.8–4.9)
Alkaline Phosphatase: 69 IU/L (ref 44–121)
BUN/Creatinine Ratio: 9 (ref 9–20)
BUN: 9 mg/dL (ref 6–24)
Bilirubin Total: 0.6 mg/dL (ref 0.0–1.2)
CO2: 22 mmol/L (ref 20–29)
Calcium: 9 mg/dL (ref 8.7–10.2)
Chloride: 102 mmol/L (ref 96–106)
Creatinine, Ser: 0.95 mg/dL (ref 0.76–1.27)
Globulin, Total: 2.3 g/dL (ref 1.5–4.5)
Glucose: 101 mg/dL — ABNORMAL HIGH (ref 70–99)
Potassium: 3.9 mmol/L (ref 3.5–5.2)
Sodium: 139 mmol/L (ref 134–144)
Total Protein: 7 g/dL (ref 6.0–8.5)
eGFR: 93 mL/min/{1.73_m2} (ref >59)

## 2021-12-24 LAB — LIPID PANEL
Chol/HDL Ratio: 4.7 ratio (ref 0.0–5.0)
Cholesterol, Total: 177 mg/dL (ref 100–199)
HDL: 38 mg/dL — ABNORMAL LOW (ref >39)
LDL Chol Calc (NIH): 111 mg/dL — ABNORMAL HIGH (ref 0–99)
Triglycerides: 159 mg/dL — ABNORMAL HIGH (ref 0–149)
VLDL Cholesterol Cal: 28 mg/dL (ref 5–40)

## 2021-12-24 LAB — TSH: TSH: 1.01 u[IU]/mL (ref 0.450–4.500)

## 2021-12-26 ENCOUNTER — Other Ambulatory Visit: Payer: Self-pay

## 2021-12-26 ENCOUNTER — Encounter: Payer: Self-pay | Admitting: Family Medicine

## 2021-12-26 ENCOUNTER — Telehealth: Payer: Self-pay | Admitting: Family Medicine

## 2021-12-26 ENCOUNTER — Ambulatory Visit: Payer: Commercial Managed Care - HMO | Admitting: Family Medicine

## 2021-12-26 NOTE — Telephone Encounter (Signed)
Sent to [facility] for PA

## 2021-12-26 NOTE — Progress Notes (Signed)
Established Patient Office Visit  Subjective    Patient ID: Bradley Howe, male    DOB: 06-Jan-1966  Age: 56 y.o. MRN: [id number]  CC:  Chief Complaint  Patient presents with   Annual Exam    HPI Bradley Howe presents for routine annual exam. Patient denies acute complaints or concerns.    Outpatient Encounter Medications as of 12/23/2021  Medication Sig   Continuous Blood Gluc Sensor (FREESTYLE LIBRE 2 SENSOR) MISC Utilize as recommended to monitor blood glucose   gabapentin (NEURONTIN) 300 MG capsule Take 1 capsule (300 mg total) by mouth 3 (three) times daily.   ibuprofen (ADVIL) 600 MG tablet Take 1 tablet (600 mg total) by mouth every 8 (eight) hours as needed.   insulin NPH-regular Human (HUMULIN 70/30) (70-30) 100 UNIT/ML injection Inject 46 Units into the skin 2 (two) times daily with a meal.   lisinopril (ZESTRIL) 10 MG tablet Take 1 tablet (10 mg total) by mouth daily.   omeprazole (PRILOSEC) 20 MG capsule Take by mouth.   omeprazole (PRILOSEC) 20 MG capsule Take 1 capsule by mouth daily.   ondansetron (ZOFRAN-ODT) 4 MG disintegrating tablet Take 1 tablet (4 mg total) by mouth every 8 (eight) hours as needed for nausea or vomiting.   rosuvastatin (CRESTOR) 20 MG tablet Take 1 tablet (20 mg total) by mouth daily.   sildenafil (VIAGRA) 100 MG tablet Take 0.5-1 tablets (50-100 mg total) by mouth daily as needed for erectile dysfunction.   [DISCONTINUED] gabapentin (NEURONTIN) 300 MG capsule Take 1 capsule (300 mg total) by mouth at bedtime.   [DISCONTINUED] Semaglutide,0.25 or 0.5MG/DOS, (OZEMPIC, 0.25 OR 0.5 MG/DOSE,) 2 MG/3ML SOPN Inject 0.5 mg into the skin once a week.   Semaglutide,0.25 or 0.5MG/DOS, (OZEMPIC, 0.25 OR 0.5 MG/DOSE,) 2 MG/3ML SOPN Inject 0.5 mg into the skin once a week.   Facility-Administered Encounter Medications as of 12/23/2021  Medication   0.9 %  sodium chloride infusion    Past Medical History:  Diagnosis Date   GERD (gastroesophageal reflux  disease)    High cholesterol    Hypertension    Type II diabetes mellitus ([place])     Past Surgical History:  Procedure Laterality Date   INGUINAL HERNIA REPAIR Left 2013    Family History  Problem Relation Age of Onset   Hypertension Mother    Hypertension Father    Diabetes Paternal Grandmother    Heart disease Neg Hx    Colon cancer Neg Hx    Pancreatic cancer Neg Hx    Esophageal cancer Neg Hx    Stomach cancer Neg Hx    Rectal cancer Neg Hx     Social History   Socioeconomic History   Marital status: Married    Spouse name: Not on file   Number of children: Not on file   Years of education: Not on file   Highest education level: Not on file  Occupational History   Not on file  Tobacco Use   Smoking status: Former    Packs/day: 0.10    Years: 25.00    Total pack years: 2.50    Types: Cigarettes   Smokeless tobacco: Never   Tobacco comments:    "quit smoking cigarettes in 2013"  Substance and Sexual Activity   Alcohol use: Yes    Comment: occ   Drug use: No   Sexual activity: Yes  Other Topics Concern   Not on file  Social History Narrative   Not on file   Social  Determinants of Health   Financial Resource Strain: Not on file  Food Insecurity: Not on file  Transportation Needs: Not on file  Physical Activity: Not on file  Stress: Not on file  Social Connections: Not on file  Intimate Partner Violence: Not on file    Review of Systems  All other systems reviewed and are negative.       Objective    BP (!) 154/95   Pulse 83   Temp 97.7 F (36.5 C) (Oral)   Resp 16   Ht _0  (1.702 m)   Wt 227 lb 9.6 oz (103.2 kg)   SpO2 96%   BMI 35.65 kg/m   Physical Exam Vitals and nursing note reviewed.  Constitutional:      General: He is not in acute distress.    Appearance: He is obese.  HENT:     Head: Normocephalic and atraumatic.     Right Ear: Tympanic membrane, ear canal and external ear normal.     Left Ear: Tympanic membrane, ear  canal and external ear normal.     Nose: Nose normal.     Mouth/Throat:     Mouth: Mucous membranes are moist.     Pharynx: Oropharynx is clear.  Eyes:     Conjunctiva/sclera: Conjunctivae normal.     Pupils: Pupils are equal, round, and reactive to light.  Neck:     Thyroid: No thyromegaly.  Cardiovascular:     Rate and Rhythm: Normal rate and regular rhythm.     Heart sounds: Normal heart sounds. No murmur heard. Pulmonary:     Effort: Pulmonary effort is normal.     Breath sounds: Normal breath sounds.  Abdominal:     General: There is no distension.     Palpations: Abdomen is soft. There is no mass.     Tenderness: There is no abdominal tenderness.     Hernia: There is no hernia in the left inguinal area or right inguinal area.  Genitourinary:    Penis: Normal.      Testes: Normal.  Musculoskeletal:        General: Normal range of motion.     Cervical back: Normal range of motion and neck supple.     Right lower leg: No edema.     Left lower leg: No edema.  Skin:    General: Skin is warm and dry.  Neurological:     General: No focal deficit present.     Mental Status: He is alert and oriented to person, place, and time. Mental status is at baseline.  Psychiatric:        Mood and Affect: Mood normal.        Behavior: Behavior normal.         Assessment & Plan:   1. Annual physical exam  - CMP14+EGFR  2. Type 2 diabetes mellitus with diabetic neuropathy, with long-term current use of insulin (HCC)  - POCT glycosylated hemoglobin (Hb A1C) - Semaglutide,0.25 or 0.5MG/DOS, (OZEMPIC, 0.25 OR 0.5 MG/DOSE,) 2 MG/3ML SOPN; Inject 0.5 mg into the skin once a week.  Dispense: 3 mL; Refill: 2  3. Screening for deficiency anemia  - CBC with Differential  4. Screening for lipid disorders  - Lipid Panel  5. Screening for endocrine/metabolic/immunity disorders  - TSH    No follow-ups on file.   Becky Sax, MD

## 2021-12-26 NOTE — Telephone Encounter (Signed)
Patient called in stating his insurance has changed and for him to get his Ozempic prescription he needs a prior authorization done. He states the [place] sent an electronic authorization form on Friday to the provider to fill out. Please assist patient further. He is completely out of his medicine and needs it to be filled as soon as possible. Patient would like a call as soon as possible to let him know this is taken care of.

## 2021-12-27 ENCOUNTER — Other Ambulatory Visit: Payer: Self-pay

## 2021-12-28 ENCOUNTER — Other Ambulatory Visit: Payer: Self-pay

## 2021-12-30 ENCOUNTER — Other Ambulatory Visit: Payer: Self-pay

## 2022-01-02 ENCOUNTER — Telehealth: Payer: Self-pay

## 2022-01-02 ENCOUNTER — Other Ambulatory Visit: Payer: Self-pay

## 2022-01-02 NOTE — Telephone Encounter (Signed)
Ozempic is requiring a prior auth for coverage, but the rejection the pharmacy gets states plan exclusion.  He does not have a history of Trulicity and Victoza.  The Trulicity went through ins with a $20 copay when a test claim was submitted.  Would changing his therapy to Trulicity be appropriate?  The patient called this afternoon frustrated that the Ozempic was still not processing on his ins.

## 2022-01-03 ENCOUNTER — Other Ambulatory Visit: Payer: Self-pay

## 2022-01-03 ENCOUNTER — Other Ambulatory Visit: Payer: Self-pay | Admitting: Pharmacist

## 2022-01-03 DIAGNOSIS — E1140 Type 2 diabetes mellitus with diabetic neuropathy, unspecified: Secondary | ICD-10-CM

## 2022-01-03 MED ORDER — TRULICITY 0.75 MG/0.5ML ~~LOC~~ SOAJ
0.7500 mg | SUBCUTANEOUS | 0 refills | Status: DC
Start: 2022-01-03 — End: 2022-04-12
  Filled 2022-01-03 – 2022-01-11 (×2): qty 2, 28d supply, fill #0

## 2022-01-03 MED ORDER — TRULICITY 1.5 MG/0.5ML ~~LOC~~ SOAJ
1.5000 mg | SUBCUTANEOUS | 2 refills | Status: DC
  Filled 2022-01-03 – 2022-02-02 (×7): qty 2, 28d supply, fill #0
  Filled 2022-02-27: qty 2, 28d supply, fill #1
  Filled 2022-03-30: qty 2, 28d supply, fill #2

## 2022-01-09 ENCOUNTER — Other Ambulatory Visit: Payer: Self-pay

## 2022-01-10 ENCOUNTER — Other Ambulatory Visit: Payer: Self-pay

## 2022-01-11 ENCOUNTER — Other Ambulatory Visit: Payer: Self-pay

## 2022-01-11 MED ORDER — ROSUVASTATIN CALCIUM 20 MG PO TABS: 20.0000 mg | ORAL_TABLET | Freq: Every day | ORAL | 1 refills | Status: DC

## 2022-01-11 NOTE — Addendum Note (Signed)
Addended by: Melene Plan on: 01/11/2022 10:32 AM   Modules accepted: Orders

## 2022-01-30 ENCOUNTER — Other Ambulatory Visit: Payer: Self-pay

## 2022-01-31 ENCOUNTER — Other Ambulatory Visit: Payer: Self-pay

## 2022-02-01 ENCOUNTER — Other Ambulatory Visit: Payer: Self-pay

## 2022-02-02 ENCOUNTER — Other Ambulatory Visit: Payer: Self-pay

## 2022-02-07 ENCOUNTER — Other Ambulatory Visit: Payer: Self-pay

## 2022-02-18 ENCOUNTER — Other Ambulatory Visit: Payer: Self-pay | Admitting: Family Medicine

## 2022-02-18 DIAGNOSIS — E1165 Type 2 diabetes mellitus with hyperglycemia: Secondary | ICD-10-CM

## 2022-02-20 NOTE — Telephone Encounter (Signed)
Requested medication (s) are due for refill today: no  Requested medication (s) are on the active medication list: yes  Last refill:  09/28/21 #90 0 refills  Future visit scheduled: no  Notes to clinic:  no refills remain. Last refill doc. 01/21/22. Do you want to allow for more refills?     Requested Prescriptions  Pending Prescriptions Disp Refills   lisinopril (ZESTRIL) 10 MG tablet [Pharmacy Med Name: Lisinopril 10 MG Oral Tablet] 90 tablet 0    Sig: Take 1 tablet by mouth once daily     Cardiovascular:  ACE Inhibitors Failed - 02/18/2022  6:22 PM      Failed - Last BP in normal range    BP Readings from Last 1 Encounters:  12/23/21 (!) 154/95         Passed - Cr in normal range and within 180 days    Creat  Date Value Ref Range Status  01/15/2013 1.36 (H) 0.50 - 1.35 mg/dL Final   Creatinine, Ser  Date Value Ref Range Status  12/23/2021 0.95 0.76 - 1.27 mg/dL Final         Passed - K in normal range and within 180 days    Potassium  Date Value Ref Range Status  12/23/2021 3.9 3.5 - 5.2 mmol/L Final         Passed - Patient is not pregnant      Passed - Valid encounter within last 6 months    Recent Outpatient Visits           1 month ago Annual physical exam   Primary Care at [facility], MD   7 months ago Type 2 diabetes mellitus with hyperglycemia, with long-term current use of insulin [facility])   [place], Annie Main L, RPH-CPP   8 months ago Type 2 diabetes mellitus with hyperglycemia, with long-term current use of insulin [facility])   [place], Annie Main L, RPH-CPP   9 months ago Type 2 diabetes mellitus with hyperglycemia, with long-term current use of insulin [facility])   Primary Care at [facility], Clyde Canterbury, MD   10 months ago Type 2 diabetes mellitus with hyperglycemia, with long-term current use of insulin [facility])   Primary Care at [facility], MD

## 2022-02-28 ENCOUNTER — Other Ambulatory Visit: Payer: Self-pay

## 2022-03-30 ENCOUNTER — Other Ambulatory Visit: Payer: Self-pay

## 2022-04-02 ENCOUNTER — Other Ambulatory Visit: Payer: Self-pay | Admitting: Family Medicine

## 2022-04-04 ENCOUNTER — Other Ambulatory Visit (HOSPITAL_COMMUNITY): Payer: Self-pay

## 2022-04-04 MED ORDER — SILDENAFIL CITRATE 100 MG PO TABS
50.0000 mg | ORAL_TABLET | Freq: Every day | ORAL | 0 refills | Status: DC | PRN
  Filled 2022-04-04: qty 10, 30d supply, fill #0

## 2022-04-05 ENCOUNTER — Other Ambulatory Visit: Payer: Self-pay

## 2022-04-05 ENCOUNTER — Other Ambulatory Visit: Payer: Self-pay | Admitting: Family Medicine

## 2022-04-07 ENCOUNTER — Other Ambulatory Visit: Payer: Self-pay | Admitting: Family Medicine

## 2022-04-07 ENCOUNTER — Encounter (HOSPITAL_COMMUNITY): Payer: Self-pay | Admitting: Pharmacist

## 2022-04-07 ENCOUNTER — Other Ambulatory Visit (HOSPITAL_COMMUNITY): Payer: Self-pay

## 2022-04-07 ENCOUNTER — Other Ambulatory Visit: Payer: Self-pay

## 2022-04-07 DIAGNOSIS — E1165 Type 2 diabetes mellitus with hyperglycemia: Secondary | ICD-10-CM

## 2022-04-10 ENCOUNTER — Other Ambulatory Visit: Payer: Self-pay

## 2022-04-10 MED ORDER — HUMULIN 70/30 (70-30) 100 UNIT/ML ~~LOC~~ SUSP
46.0000 [IU] | Freq: Two times a day (BID) | SUBCUTANEOUS | 1 refills | Status: DC
  Filled 2022-04-10: qty 20, 22d supply, fill #0
  Filled 2022-05-21: qty 20, 22d supply, fill #1
  Filled 2022-06-28: qty 20, 22d supply, fill #2

## 2022-04-10 NOTE — Telephone Encounter (Signed)
Requested Prescriptions  Pending Prescriptions Disp Refills   insulin NPH-regular Human (HUMULIN 70/30) (70-30) 100 UNIT/ML injection 30 mL 1    Sig: Inject 46 Units into the skin 2 (two) times daily with a meal.     Endocrinology:  Diabetes - Insulins Failed - 04/07/2022  4:56 PM      Failed - HBA1C is between 0 and 7.9 and within 180 days    Hemoglobin A1C  Date Value Ref Range Status  12/23/2021 8.0 (A) 4.0 - 5.6 % Final   Hgb A1c MFr Bld  Date Value Ref Range Status  07/25/2021 8.4 (H) 4.8 - 5.6 % Final    Comment:             Prediabetes: 5.7 - 6.4          Diabetes: >6.4          Glycemic control for adults with diabetes: <7.0          Passed - Valid encounter within last 6 months    Recent Outpatient Visits           3 months ago Annual physical exam   Primary Care at [facility], Clyde Canterbury, MD   8 months ago Type 2 diabetes mellitus with hyperglycemia, with long-term current use of insulin [facility])   [place], Annie Main L, RPH-CPP   9 months ago Type 2 diabetes mellitus with hyperglycemia, with long-term current use of insulin [facility])   [place], Annie Main L, RPH-CPP   10 months ago Type 2 diabetes mellitus with hyperglycemia, with long-term current use of insulin [facility])   Primary Care at [facility], Clyde Canterbury, MD   11 months ago Type 2 diabetes mellitus with hyperglycemia, with long-term current use of insulin [facility])   Primary Care at [facility], MD       Future Appointments             In 2 days Dorna Mai, MD Primary Care at [facility]

## 2022-04-11 ENCOUNTER — Other Ambulatory Visit: Payer: Self-pay

## 2022-04-12 ENCOUNTER — Ambulatory Visit (INDEPENDENT_AMBULATORY_CARE_PROVIDER_SITE_OTHER): Payer: Commercial Managed Care - HMO | Admitting: Family Medicine

## 2022-04-12 ENCOUNTER — Other Ambulatory Visit: Payer: Self-pay

## 2022-04-12 VITALS — BP 150/94 | HR 77 | Temp 98.1°F | Resp 16 | Wt 238.0 lb

## 2022-04-12 DIAGNOSIS — Z794 Long term (current) use of insulin: Secondary | ICD-10-CM

## 2022-04-12 DIAGNOSIS — E1140 Type 2 diabetes mellitus with diabetic neuropathy, unspecified: Secondary | ICD-10-CM

## 2022-04-12 DIAGNOSIS — E785 Hyperlipidemia, unspecified: Secondary | ICD-10-CM

## 2022-04-12 DIAGNOSIS — I10 Essential (primary) hypertension: Secondary | ICD-10-CM | POA: Diagnosis not present

## 2022-04-12 LAB — POCT GLYCOSYLATED HEMOGLOBIN (HGB A1C): Hemoglobin A1C: 6.8 % — AB (ref 4.0–5.6)

## 2022-04-12 MED ORDER — TRULICITY 0.75 MG/0.5ML ~~LOC~~ SOAJ
0.7500 mg | SUBCUTANEOUS | 0 refills | Status: DC
  Filled 2022-04-12: qty 2, 28d supply, fill #0

## 2022-04-12 MED ORDER — LISINOPRIL 20 MG PO TABS
20.0000 mg | ORAL_TABLET | Freq: Every day | ORAL | 0 refills | Status: DC
  Filled 2022-04-12: qty 30, 30d supply, fill #0
  Filled 2022-05-21: qty 30, 30d supply, fill #1
  Filled 2022-07-04: qty 30, 30d supply, fill #2

## 2022-04-12 NOTE — Progress Notes (Unsigned)
Patient is here for their 3 month follow-up Patient has no concerns today Care gaps have been discussed with patient  

## 2022-04-13 ENCOUNTER — Other Ambulatory Visit: Payer: Self-pay

## 2022-04-13 ENCOUNTER — Encounter: Payer: Self-pay | Admitting: Family Medicine

## 2022-04-13 NOTE — Progress Notes (Signed)
Established Patient Office Visit  Subjective    Patient ID: Bradley Howe, male    DOB: [birth year]  Age: 56 y.o. MRN: [id number]  CC:  Chief Complaint  Patient presents with   Follow-up   Diabetes    HPI Bradley Howe presents for follow up of chronic med issues. Patient denies acute complaints or concerns.    Outpatient Encounter Medications as of 04/12/2022  Medication Sig   Continuous Blood Gluc Sensor (FREESTYLE LIBRE 2 SENSOR) MISC Utilize as recommended to monitor blood glucose   Dulaglutide (TRULICITY) 1.5 TM/1.9QQ SOPN Inject 1.5 mg into the skin once a week. Start once you have completed 4 weeks of the 0.75 mg dose.   gabapentin (NEURONTIN) 300 MG capsule Take 1 capsule (300 mg total) by mouth 3 (three) times daily.   ibuprofen (ADVIL) 600 MG tablet Take 1 tablet (600 mg total) by mouth every 8 (eight) hours as needed.   insulin NPH-regular Human (HUMULIN 70/30) (70-30) 100 UNIT/ML injection Inject 46 Units into the skin 2 (two) times daily with a meal.   lisinopril (ZESTRIL) 20 MG tablet Take 1 tablet (20 mg total) by mouth daily.   omeprazole (PRILOSEC) 20 MG capsule Take by mouth.   omeprazole (PRILOSEC) 20 MG capsule Take 1 capsule by mouth daily.   ondansetron (ZOFRAN-ODT) 4 MG disintegrating tablet Take 1 tablet (4 mg total) by mouth every 8 (eight) hours as needed for nausea or vomiting.   rosuvastatin (CRESTOR) 20 MG tablet Take 1 tablet (20 mg total) by mouth daily.   sildenafil (VIAGRA) 100 MG tablet Take 0.5-1 tablets (50-100 mg total) by mouth daily as needed for erectile dysfunction.   [DISCONTINUED] Dulaglutide (TRULICITY) 2.29 NL/8.9QJ SOPN Inject 0.75 mg into the skin once a week.   [DISCONTINUED] lisinopril (ZESTRIL) 10 MG tablet Take 1 tablet by mouth once daily   Dulaglutide (TRULICITY) 1.94 RD/4.0CX SOPN Inject 0.75 mg into the skin once a week.   Facility-Administered Encounter Medications as of 04/12/2022  Medication   0.9 %  sodium chloride infusion     Past Medical History:  Diagnosis Date   GERD (gastroesophageal reflux disease)    High cholesterol    Hypertension    Type II diabetes mellitus ([place])     Past Surgical History:  Procedure Laterality Date   INGUINAL HERNIA REPAIR Left 2013    Family History  Problem Relation Age of Onset   Hypertension Mother    Hypertension Father    Diabetes Paternal Grandmother    Heart disease Neg Hx    Colon cancer Neg Hx    Pancreatic cancer Neg Hx    Esophageal cancer Neg Hx    Stomach cancer Neg Hx    Rectal cancer Neg Hx     Social History   Socioeconomic History   Marital status: Married    Spouse name: Not on file   Number of children: Not on file   Years of education: Not on file   Highest education level: Not on file  Occupational History   Not on file  Tobacco Use   Smoking status: Former    Packs/day: 0.10    Years: 25.00    Total pack years: 2.50    Types: Cigarettes   Smokeless tobacco: Never   Tobacco comments:    "quit smoking cigarettes in 2013"  Substance and Sexual Activity   Alcohol use: Yes    Comment: occ   Drug use: No   Sexual activity: Yes  Other Topics  Concern   Not on file  Social History Narrative   Not on file   Social Determinants of Health   Financial Resource Strain: Not on file  Food Insecurity: Not on file  Transportation Needs: Not on file  Physical Activity: Not on file  Stress: Not on file  Social Connections: Not on file  Intimate Partner Violence: Not on file    Review of Systems  All other systems reviewed and are negative.       Objective    BP (!) 150/94   Pulse 77   Temp 98.1 F (36.7 C) (Oral)   Resp 16   Wt 238 lb (108 kg)   SpO2 97%   BMI 37.28 kg/m   Physical Exam Vitals and nursing note reviewed.  Constitutional:      General: He is not in acute distress.    Appearance: He is obese.  Cardiovascular:     Rate and Rhythm: Normal rate and regular rhythm.  Pulmonary:     Effort: Pulmonary  effort is normal.     Breath sounds: Normal breath sounds.  Abdominal:     Palpations: Abdomen is soft.     Tenderness: There is no abdominal tenderness.  Musculoskeletal:     Right lower leg: No edema.     Left lower leg: No edema.  Neurological:     General: No focal deficit present.     Mental Status: He is alert and oriented to person, place, and time.         Assessment & Plan:   1. Type 2 diabetes mellitus with diabetic neuropathy, with long-term current use of insulin (HCC) Much improved A1c and now at goal. Continue and monitor - POCT glycosylated hemoglobin (Hb A1C)  2. Essential hypertension Elevated readings. Will increase lisinopril form 10 mgt to 20 mg daily and monitor  3. Hyperlipidemia, unspecified hyperlipidemia type continue    Return in about 6 weeks (around 05/24/2022) for follow up.   Bradley Sax, MD

## 2022-04-19 ENCOUNTER — Other Ambulatory Visit (HOSPITAL_COMMUNITY): Payer: Self-pay

## 2022-04-25 ENCOUNTER — Other Ambulatory Visit: Payer: Self-pay | Admitting: Family Medicine

## 2022-04-26 ENCOUNTER — Other Ambulatory Visit: Payer: Self-pay

## 2022-04-26 MED ORDER — TRULICITY 1.5 MG/0.5ML ~~LOC~~ SOAJ
1.5000 mg | SUBCUTANEOUS | 2 refills | Status: DC
  Filled 2022-04-26: qty 2, 28d supply, fill #0
  Filled 2022-05-29: qty 2, 28d supply, fill #1
  Filled 2022-07-03: qty 2, 28d supply, fill #2

## 2022-04-28 MED ORDER — OMEPRAZOLE 20 MG PO CPDR: 20.0000 mg | DELAYED_RELEASE_CAPSULE | Freq: Every day | ORAL | 0 refills | Status: DC

## 2022-05-22 ENCOUNTER — Other Ambulatory Visit: Payer: Self-pay

## 2022-05-22 ENCOUNTER — Other Ambulatory Visit: Payer: Self-pay | Admitting: Family Medicine

## 2022-05-22 DIAGNOSIS — E1165 Type 2 diabetes mellitus with hyperglycemia: Secondary | ICD-10-CM

## 2022-05-24 ENCOUNTER — Other Ambulatory Visit: Payer: Self-pay

## 2022-05-25 ENCOUNTER — Other Ambulatory Visit (HOSPITAL_COMMUNITY): Payer: Self-pay

## 2022-05-25 ENCOUNTER — Ambulatory Visit: Payer: Commercial Managed Care - HMO | Admitting: Family Medicine

## 2022-05-25 ENCOUNTER — Other Ambulatory Visit: Payer: Self-pay

## 2022-05-29 ENCOUNTER — Other Ambulatory Visit: Payer: Self-pay

## 2022-06-02 ENCOUNTER — Other Ambulatory Visit: Payer: Self-pay

## 2022-06-05 ENCOUNTER — Other Ambulatory Visit: Payer: Self-pay

## 2022-06-14 ENCOUNTER — Other Ambulatory Visit: Payer: Self-pay | Admitting: Family Medicine

## 2022-06-14 ENCOUNTER — Ambulatory Visit: Payer: Commercial Managed Care - HMO | Admitting: Family Medicine

## 2022-06-14 ENCOUNTER — Other Ambulatory Visit: Payer: Self-pay

## 2022-06-14 MED ORDER — FREESTYLE LIBRE 2 SENSOR MISC
5 refills | Status: DC
  Filled 2022-06-14 – 2022-07-04 (×2): qty 2, 28d supply, fill #0
  Filled 2022-10-10 – 2022-10-25 (×2): qty 2, 28d supply, fill #1
  Filled 2023-01-12 – 2023-06-06 (×2): qty 2, 28d supply, fill #2

## 2022-06-14 NOTE — Telephone Encounter (Signed)
Pt needs RX for Continuous Blood Gluc Sensor (FREESTYLE LIBRE 2 SENSOR) MISC [[id number]]  [facility] [address] / please advise   Pt is out of sensors and needs asap

## 2022-06-14 NOTE — Telephone Encounter (Signed)
Requested Prescriptions  Pending Prescriptions Disp Refills   Continuous Blood Gluc Sensor (FREESTYLE LIBRE 2 SENSOR) MISC 2 each 5    Sig: Utilize as recommended to monitor blood glucose     Endocrinology: Diabetes - Testing Supplies Passed - 06/14/2022 10:21 AM      Passed - Valid encounter within last 12 months    Recent Outpatient Visits           2 months ago Type 2 diabetes mellitus with diabetic neuropathy, with long-term current use of insulin ([place])   [place] [facility], MD   5 months ago Annual physical exam   [place] [facility], Clyde Canterbury, MD   10 months ago Type 2 diabetes mellitus with hyperglycemia, with long-term current use of insulin [facility])   [place], [place] L, RPH-CPP   12 months ago Type 2 diabetes mellitus with hyperglycemia, with long-term current use of insulin [facility])   [place], [place] L, RPH-CPP   1 year ago Type 2 diabetes mellitus with hyperglycemia, with long-term current use of insulin [facility])   [place] [facility], MD       Future Appointments             In 3 weeks Dorna Mai, MD [facility]

## 2022-06-15 NOTE — Progress Notes (Signed)
This encounter was created in error - please disregard.

## 2022-06-20 ENCOUNTER — Other Ambulatory Visit: Payer: Self-pay

## 2022-06-26 ENCOUNTER — Other Ambulatory Visit: Payer: Self-pay

## 2022-06-26 NOTE — Progress Notes (Signed)
Patient attempted to be outreached by Levi Aland, PharmD Candidate on 06/26/22 to discuss hypertension. Left voicemail for patient to return our call at their convenience at [phone].   Levi Aland, PharmD Candidate, Class of [phone]  [place], [place].D. PGY-2 Ambulatory Care Pharmacy Resident 06/26/2022 1:20 PM

## 2022-06-29 ENCOUNTER — Other Ambulatory Visit: Payer: Self-pay

## 2022-07-03 ENCOUNTER — Telehealth: Payer: Self-pay

## 2022-07-03 NOTE — Telephone Encounter (Signed)
Patient attempted to be outreached by Levi Aland, PharmD Candidate on 07/03/22 to discuss hypertension. Left voicemail for patient to return our call at their convenience at [phone].   Levi Aland, PharmD Candidate, Class of [phone]  [place], [place].D. PGY-2 Ambulatory Care Pharmacy Resident 07/03/2022 1:23 PM

## 2022-07-04 ENCOUNTER — Other Ambulatory Visit: Payer: Self-pay

## 2022-07-05 ENCOUNTER — Other Ambulatory Visit: Payer: Self-pay

## 2022-07-06 NOTE — Telephone Encounter (Signed)
Patient attempted to be outreached by Junius Finner, PharmD Candidate on 07/06/2022 to discuss hypertension. Left voicemail for patient to return our call at their convenience at [phone].   [place] [facility]  PharmD Candidate 2024   Maryan Puls, PharmD PGY-1 [facility] Pharmacy Resident

## 2022-07-07 ENCOUNTER — Other Ambulatory Visit: Payer: Self-pay

## 2022-07-10 ENCOUNTER — Other Ambulatory Visit: Payer: Self-pay

## 2022-07-10 NOTE — Telephone Encounter (Signed)
Patient attempted to be outreached by Levi Aland, PharmD Candidate on 07/10/22 to discuss hypertension. Left voicemail for patient to return our call at their convenience at [phone].   Levi Aland, PharmD Candidate, Class of 2024  [place]. [place], PharmD PGY-1 [facility] Resident

## 2022-07-11 ENCOUNTER — Ambulatory Visit (INDEPENDENT_AMBULATORY_CARE_PROVIDER_SITE_OTHER): Payer: Commercial Managed Care - HMO | Admitting: Family Medicine

## 2022-07-11 ENCOUNTER — Other Ambulatory Visit: Payer: Self-pay

## 2022-07-11 ENCOUNTER — Encounter: Payer: Self-pay | Admitting: Family Medicine

## 2022-07-11 VITALS — BP 139/86 | HR 76 | Temp 97.6°F | Resp 16 | Wt 237.4 lb

## 2022-07-11 DIAGNOSIS — E1140 Type 2 diabetes mellitus with diabetic neuropathy, unspecified: Secondary | ICD-10-CM

## 2022-07-11 DIAGNOSIS — Z794 Long term (current) use of insulin: Secondary | ICD-10-CM | POA: Diagnosis not present

## 2022-07-11 DIAGNOSIS — I10 Essential (primary) hypertension: Secondary | ICD-10-CM | POA: Diagnosis not present

## 2022-07-11 DIAGNOSIS — E785 Hyperlipidemia, unspecified: Secondary | ICD-10-CM

## 2022-07-11 DIAGNOSIS — K219 Gastro-esophageal reflux disease without esophagitis: Secondary | ICD-10-CM

## 2022-07-11 LAB — POCT GLYCOSYLATED HEMOGLOBIN (HGB A1C): Hemoglobin A1C: 6.9 % — AB (ref 4.0–5.6)

## 2022-07-11 MED ORDER — TRULICITY 1.5 MG/0.5ML ~~LOC~~ SOAJ
1.5000 mg | SUBCUTANEOUS | 2 refills | Status: DC
  Filled 2022-07-11 – 2022-07-24 (×2): qty 2, 28d supply, fill #0
  Filled 2022-08-13 – 2022-08-14 (×2): qty 2, 28d supply, fill #1
  Filled 2022-09-15: qty 2, 28d supply, fill #2

## 2022-07-11 NOTE — Progress Notes (Unsigned)
New Patient Office Visit  Subjective    Patient ID: Bradley Howe, male    DOB: 01-11-1966  Age: 57 y.o. MRN: [id number]  CC:  Chief Complaint  Patient presents with   Follow-up   Diabetes    HPI Bradley Howe presents to establish care ***  Outpatient Encounter Medications as of 07/11/2022  Medication Sig   Continuous Blood Gluc Sensor (FREESTYLE LIBRE 2 SENSOR) MISC Utilize as recommended to monitor blood glucose   Dulaglutide (TRULICITY) 1.5 0000000 SOPN Inject 1.5 mg into the skin once a week. Start once you have completed 4 weeks of the 0.75 mg dose.   gabapentin (NEURONTIN) 300 MG capsule Take 1 capsule (300 mg total) by mouth 3 (three) times daily.   ibuprofen (ADVIL) 600 MG tablet Take 1 tablet (600 mg total) by mouth every 8 (eight) hours as needed.   insulin NPH-regular Human (HUMULIN 70/30) (70-30) 100 UNIT/ML injection Inject 46 Units into the skin 2 (two) times daily with a meal.   lisinopril (ZESTRIL) 10 MG tablet Take 1 tablet by mouth once daily   lisinopril (ZESTRIL) 20 MG tablet Take 1 tablet (20 mg total) by mouth daily.   omeprazole (PRILOSEC) 20 MG capsule Take 1 capsule by mouth daily.   omeprazole (PRILOSEC) 20 MG capsule Take 1 capsule (20 mg total) by mouth daily.   ondansetron (ZOFRAN-ODT) 4 MG disintegrating tablet Take 1 tablet (4 mg total) by mouth every 8 (eight) hours as needed for nausea or vomiting.   rosuvastatin (CRESTOR) 20 MG tablet Take 1 tablet (20 mg total) by mouth daily.   sildenafil (VIAGRA) 100 MG tablet Take 0.5-1 tablets (50-100 mg total) by mouth daily as needed for erectile dysfunction.   Facility-Administered Encounter Medications as of 07/11/2022  Medication   0.9 %  sodium chloride infusion    Past Medical History:  Diagnosis Date   GERD (gastroesophageal reflux disease)    High cholesterol    Hypertension    Type II diabetes mellitus ([place])     Past Surgical History:  Procedure Laterality Date   INGUINAL HERNIA REPAIR  Left 2013    Family History  Problem Relation Age of Onset   Hypertension Mother    Hypertension Father    Diabetes Paternal Grandmother    Heart disease Neg Hx    Colon cancer Neg Hx    Pancreatic cancer Neg Hx    Esophageal cancer Neg Hx    Stomach cancer Neg Hx    Rectal cancer Neg Hx     Social History   Socioeconomic History   Marital status: Married    Spouse name: Not on file   Number of children: Not on file   Years of education: Not on file   Highest education level: Not on file  Occupational History   Not on file  Tobacco Use   Smoking status: Former    Packs/day: 0.10    Years: 25.00    Total pack years: 2.50    Types: Cigarettes   Smokeless tobacco: Never   Tobacco comments:    "quit smoking cigarettes in 2013"  Substance and Sexual Activity   Alcohol use: Yes    Comment: occ   Drug use: No   Sexual activity: Yes  Other Topics Concern   Not on file  Social History Narrative   Not on file   Social Determinants of Health   Financial Resource Strain: Not on file  Food Insecurity: Not on file  Transportation Needs: Not  on file  Physical Activity: Not on file  Stress: Not on file  Social Connections: Not on file  Intimate Partner Violence: Not on file    ROS      Objective    BP 139/86   Pulse 76   Temp 97.6 F (36.4 C) (Oral)   Resp 16   Wt 237 lb 6.4 oz (107.7 kg)   SpO2 96%   BMI 37.18 kg/m   Physical Exam  {Labs (Optional):23779}    Assessment & Plan:   Problem List Items Addressed This Visit   None Visit Diagnoses     Type 2 diabetes mellitus with diabetic neuropathy, with long-term current use of insulin (HCC)    -  Primary   Relevant Orders   POCT glycosylated hemoglobin (Hb A1C) (Completed)   HM Diabetes Foot Exam (Completed)       No follow-ups on file.   Becky Sax, MD

## 2022-07-13 ENCOUNTER — Encounter: Payer: Self-pay | Admitting: Family Medicine

## 2022-07-16 ENCOUNTER — Other Ambulatory Visit: Payer: Self-pay | Admitting: Family Medicine

## 2022-07-16 DIAGNOSIS — E1165 Type 2 diabetes mellitus with hyperglycemia: Secondary | ICD-10-CM

## 2022-07-17 ENCOUNTER — Other Ambulatory Visit: Payer: Self-pay

## 2022-07-20 ENCOUNTER — Other Ambulatory Visit: Payer: Self-pay

## 2022-07-20 MED ORDER — HUMULIN 70/30 (70-30) 100 UNIT/ML ~~LOC~~ SUSP
46.0000 [IU] | Freq: Two times a day (BID) | SUBCUTANEOUS | 1 refills | Status: DC
  Filled 2022-07-20: qty 30, 30d supply, fill #0
  Filled 2022-08-22: qty 30, 30d supply, fill #1

## 2022-07-24 ENCOUNTER — Ambulatory Visit (INDEPENDENT_AMBULATORY_CARE_PROVIDER_SITE_OTHER): Payer: Commercial Managed Care - HMO

## 2022-07-24 ENCOUNTER — Encounter (HOSPITAL_COMMUNITY): Payer: Self-pay

## 2022-07-24 ENCOUNTER — Other Ambulatory Visit: Payer: Self-pay

## 2022-07-24 ENCOUNTER — Ambulatory Visit (HOSPITAL_COMMUNITY)
Admission: EM | Admit: 2022-07-24 | Discharge: 2022-07-24 | Disposition: A | Discharge status: Home / Self Care | Payer: Commercial Managed Care - HMO | Attending: Family Medicine | Admitting: Family Medicine

## 2022-07-24 DIAGNOSIS — M25561 Pain in right knee: Secondary | ICD-10-CM

## 2022-07-24 DIAGNOSIS — M79672 Pain in left foot: Secondary | ICD-10-CM

## 2022-07-24 DIAGNOSIS — M25562 Pain in left knee: Secondary | ICD-10-CM | POA: Diagnosis not present

## 2022-07-24 DIAGNOSIS — M545 Low back pain, unspecified: Secondary | ICD-10-CM

## 2022-07-24 DIAGNOSIS — R2242 Localized swelling, mass and lump, left lower limb: Secondary | ICD-10-CM

## 2022-07-24 DIAGNOSIS — G8929 Other chronic pain: Secondary | ICD-10-CM | POA: Diagnosis not present

## 2022-07-24 MED ORDER — TIZANIDINE HCL 4 MG PO TABS
4.0000 mg | ORAL_TABLET | Freq: Four times a day (QID) | ORAL | 0 refills | Status: DC | PRN
  Filled 2022-07-24: qty 30, 8d supply, fill #0

## 2022-07-24 NOTE — Discharge Instructions (Addendum)
You were seen today for various issues.  I have sent out a muscle relaxer for your back pain.  This could make you tired/sleepy so please take when home and not driving.  Your right knee xray was normal.  The left knee shows some arthritis and a possible foreign body.  Your food xray was normal.  I recommend you take tylenol for pain, and use ice on the areas of pain.  You may need to follow up with an orthopedist if not improving.

## 2022-07-24 NOTE — ED Provider Notes (Signed)
[place]    CSN: [id number] Arrival date & time: 07/24/22  1002      History   Chief Complaint Chief Complaint  Patient presents with   Back Pain   Knee Pain    HPI Bradley Howe is a 57 y.o. male.   Patient is here for several issues.  He has bilateral knee pain.  The right knee has been swollen and painful x 1 month, last week the left knee started with pain and swelling.  Also having some lower back pain x years.   He had left foot swelling x 1 month.  He has talked with his pcp in regards to his back and foot issues.  He did start a new job at work, going up/down a lot.  Since then having a lot of knee pain.  No otc meds used.       Past Medical History:  Diagnosis Date   GERD (gastroesophageal reflux disease)    High cholesterol    Hypertension    Type II diabetes mellitus ([place])     Patient Active Problem List   Diagnosis Date Noted   Erectile dysfunction 04/21/2021   Obesity, unspecified 08/20/2019   Ex-smoker 08/20/2019   GERD (gastroesophageal reflux disease) 08/20/2019   Elevated LFTs 08/20/2019   Angina pectoris ([place])    Chest pain 09/17/2015   Type 2 diabetes mellitus with hyperglycemia, with long-term current use of insulin ([place]) 01/15/2013   Hypercholesterolemia 01/15/2013   Renal insufficiency 07/26/2012   Hyponatremia 07/26/2012   Essential (primary) hypertension 07/26/2012   Left inguinal hernia 05/19/2011    Past Surgical History:  Procedure Laterality Date   INGUINAL HERNIA REPAIR Left 2013       Home Medications    Prior to Admission medications   Medication Sig Start Date End Date Taking? Authorizing Provider  Continuous Blood Gluc Sensor (FREESTYLE LIBRE 2 SENSOR) MISC Utilize as recommended to monitor blood glucose 06/14/22  Yes Dorna Mai, MD  gabapentin (NEURONTIN) 300 MG capsule Take 1 capsule (300 mg total) by mouth 3 (three) times daily. 12/23/21  Yes Dorna Mai, MD  ibuprofen (ADVIL) 600 MG tablet  Take 1 tablet (600 mg total) by mouth every 8 (eight) hours as needed. 04/02/21  Yes Gildardo Pounds, NP  insulin NPH-regular Human (HUMULIN 70/30) (70-30) 100 UNIT/ML injection Inject 46 Units into the skin 2 (two) times daily with a meal. 07/20/22  Yes Dorna Mai, MD  lisinopril (ZESTRIL) 10 MG tablet Take 1 tablet by mouth once daily 05/24/22  Yes Dorna Mai, MD  lisinopril (ZESTRIL) 20 MG tablet Take 1 tablet (20 mg total) by mouth daily. 04/12/22  Yes Dorna Mai, MD  omeprazole (PRILOSEC) 20 MG capsule Take 1 capsule by mouth daily. 05/25/11  Yes [provider]  omeprazole (PRILOSEC) 20 MG capsule Take 1 capsule (20 mg total) by mouth daily. 04/28/22  Yes Dorna Mai, MD  ondansetron (ZOFRAN-ODT) 4 MG disintegrating tablet Take 1 tablet (4 mg total) by mouth every 8 (eight) hours as needed for nausea or vomiting. 09/27/21  Yes Brunetta Jeans, PA-C  rosuvastatin (CRESTOR) 20 MG tablet Take 1 tablet (20 mg total) by mouth daily. 01/11/22  Yes Dorna Mai, MD  sildenafil (VIAGRA) 100 MG tablet Take 0.5-1 tablets (50-100 mg total) by mouth daily as needed for erectile dysfunction. 04/04/22  Yes Dorna Mai, MD  Dulaglutide (TRULICITY) 1.5 0000000 SOPN Inject 1.5 mg into the skin once a week. 07/11/22   Dorna Mai, MD  Family History Family History  Problem Relation Age of Onset   Hypertension Mother    Hypertension Father    Diabetes Paternal Grandmother    Heart disease Neg Hx    Colon cancer Neg Hx    Pancreatic cancer Neg Hx    Esophageal cancer Neg Hx    Stomach cancer Neg Hx    Rectal cancer Neg Hx     Social History Social History   Tobacco Use   Smoking status: Former    Packs/day: 0.10    Years: 25.00    Total pack years: 2.50    Types: Cigarettes   Smokeless tobacco: Never   Tobacco comments:    "quit smoking cigarettes in 2013"  Substance Use Topics   Alcohol use: Yes    Comment: occ   Drug use: No     Allergies   Metformin  and related   Review of Systems Review of Systems  Constitutional: Negative.   HENT: Negative.    Respiratory: Negative.    Cardiovascular: Negative.   Gastrointestinal: Negative.   Musculoskeletal:  Positive for arthralgias and back pain.  Psychiatric/Behavioral: Negative.       Physical Exam Triage Vital Signs ED Triage Vitals [07/24/22 1043]  Enc Vitals Group     BP 131/74     Pulse Rate 97     Resp 12     Temp 98 F (36.7 C)     Temp Source Oral     SpO2 96 %     Weight      Height      Head Circumference      Peak Flow      Pain Score      Pain Loc      Pain Edu?      Excl. in [place]?    No data found.  Updated Vital Signs BP 131/74 (BP Location: Right Arm)   Pulse 97   Temp 98 F (36.7 C) (Oral)   Resp 12   SpO2 96%   Visual Acuity Right Eye Distance:   Left Eye Distance:   Bilateral Distance:    Right Eye Near:   Left Eye Near:    Bilateral Near:     Physical Exam Constitutional:      Appearance: Normal appearance.  Cardiovascular:     Rate and Rhythm: Normal rate.  Pulmonary:     Effort: Pulmonary effort is normal.  Musculoskeletal:     Comments: No spinous tenderness;  slight TTP to the right and left low back;  No swelling to the knees;  he has TTP to the patellar tendon bilaterally, as well as tenderness around the knee caps;  full rom without pain/limitation No swelling to the left foot;  TTP to the top of the mid foot diffusely.   Skin:    General: Skin is warm.  Neurological:     General: No focal deficit present.     Mental Status: He is alert.  Psychiatric:        Mood and Affect: Mood normal.      UC Treatments / Results  Labs (all labs ordered are listed, but only abnormal results are displayed) Labs Reviewed - No data to display  EKG   Radiology DG Knee Complete 4 Views Right  Result Date: 07/24/2022 CLINICAL DATA:  Knee pain EXAM: RIGHT KNEE - COMPLETE 4+ VIEW COMPARISON:  None Available. FINDINGS: Small knee joint  effusion is present. No medial compartment joint space narrowing or osteophyte  formation. No other focal bone finding. IMPRESSION: Small knee joint effusion. Electronically Signed   By: Nelson Chimes M.D.   On: 07/24/2022 11:26   DG Knee Complete 4 Views Left  Result Date: 07/24/2022 CLINICAL DATA:  Knee pain EXAM: LEFT KNEE - COMPLETE 4+ VIEW COMPARISON:  None Available. FINDINGS: No visible joint effusion. No weight-bearing compartment joint space narrowing. Very small marginal medial compartment osteophytes. Tiny metallic density foreign object in the region of the anterior infrapatellar fat pad. Has the patient had previous arthroscopy? If so, this is probably not significant. If not, this is a foreign object of unknown etiology. No other focal bone finding. IMPRESSION: 1. Very mild medial compartment osteoarthritis. No joint effusion. No weight-bearing compartment joint space narrowing. 2. Tiny metallic density foreign object in the region of the infrapatellar fat pad. Electronically Signed   By: Nelson Chimes M.D.   On: 07/24/2022 11:26   DG Foot Complete Left  Result Date: 07/24/2022 CLINICAL DATA:  Swelling of the left foot over the last month EXAM: LEFT FOOT - COMPLETE 3+ VIEW COMPARISON:  None Available. FINDINGS: Regional arterial calcification is noted. No evidence of acute fracture. No sign of osteomyelitis. Some degenerative change at the ankle joint with small anterior osteophytes and possibly a small loose body. No other focal finding. IMPRESSION: 1. No acute or traumatic finding. No sign of osteomyelitis. 2. Regional arterial calcification. 3. Mild/moderate degenerative change at the ankle joint. Electronically Signed   By: Nelson Chimes M.D.   On: 07/24/2022 11:24    Procedures Procedures (including critical care time)  Medications Ordered in UC Medications - No data to display  Initial Impression / Assessment and Plan / UC Course  I have reviewed the triage vital signs and the  nursing notes.  Pertinent labs & imaging results that were available during my care of the patient were reviewed by me and considered in my medical decision making (see chart for details).   Final Clinical Impressions(s) / UC Diagnoses   Final diagnoses:  Chronic bilateral low back pain without sciatica  Acute pain of both knees  Left foot pain     Discharge Instructions      You were seen today for various issues.  I have sent out a muscle relaxer for your back pain.  This could make you tired/sleepy so please take when home and not driving.  Your right knee xray was normal.  The left knee shows some arthritis and a possible foreign body.  Your food xray was normal.  I recommend you take tylenol for pain, and use ice on the areas of pain.  You may need to follow up with an orthopedist if not improving.     ED Prescriptions     Medication Sig Dispense Auth. Provider   tiZANidine (ZANAFLEX) 4 MG tablet Take 1 tablet (4 mg total) by mouth every 6 (six) hours as needed for muscle spasms. 30 tablet Rondel Oh, MD      PDMP not reviewed this encounter.   Rondel Oh, MD 07/24/22 [phone]

## 2022-07-24 NOTE — ED Triage Notes (Signed)
Pt is here for back pain, bilateral knee pain, swollen left foot x 1 month

## 2022-07-25 ENCOUNTER — Other Ambulatory Visit: Payer: Self-pay

## 2022-08-13 ENCOUNTER — Other Ambulatory Visit: Payer: Self-pay | Admitting: Family Medicine

## 2022-08-13 MED ORDER — LISINOPRIL 20 MG PO TABS
20.0000 mg | ORAL_TABLET | Freq: Every day | ORAL | 0 refills | Status: DC
  Filled 2022-08-13: qty 30, 30d supply, fill #0

## 2022-08-14 ENCOUNTER — Other Ambulatory Visit: Payer: Self-pay

## 2022-08-16 ENCOUNTER — Other Ambulatory Visit: Payer: Self-pay

## 2022-08-17 ENCOUNTER — Other Ambulatory Visit: Payer: Self-pay

## 2022-08-18 ENCOUNTER — Telehealth: Payer: Self-pay | Admitting: Pharmacist

## 2022-08-18 NOTE — Progress Notes (Signed)
Patient attempted to be outreached by Brianna Yarborough, PharmD Candidate on 4/5 to discuss hypertension. Left voicemail for patient to return our call at their convenience at [phone].   Brianna Yarborough, PharmD Candidate   Catie T. Elicia Lui, PharmD, BCACP, CPP [facility] [phone]  

## 2022-08-23 ENCOUNTER — Other Ambulatory Visit: Payer: Self-pay

## 2022-08-24 ENCOUNTER — Other Ambulatory Visit: Payer: Self-pay

## 2022-08-28 ENCOUNTER — Other Ambulatory Visit: Payer: Self-pay

## 2022-08-28 ENCOUNTER — Encounter: Payer: Self-pay | Admitting: Family Medicine

## 2022-08-28 ENCOUNTER — Ambulatory Visit (INDEPENDENT_AMBULATORY_CARE_PROVIDER_SITE_OTHER): Payer: Commercial Managed Care - HMO | Admitting: Family Medicine

## 2022-08-28 VITALS — BP 142/79 | HR 79 | Temp 98.2°F | Resp 16 | Wt 237.6 lb

## 2022-08-28 DIAGNOSIS — R058 Other specified cough: Secondary | ICD-10-CM | POA: Diagnosis not present

## 2022-08-28 DIAGNOSIS — Z6837 Body mass index (BMI) 37.0-37.9, adult: Secondary | ICD-10-CM

## 2022-08-28 DIAGNOSIS — M1712 Unilateral primary osteoarthritis, left knee: Secondary | ICD-10-CM | POA: Diagnosis not present

## 2022-08-28 DIAGNOSIS — Z7689 Persons encountering health services in other specified circumstances: Secondary | ICD-10-CM

## 2022-08-28 DIAGNOSIS — T464X5A Adverse effect of angiotensin-converting-enzyme inhibitors, initial encounter: Secondary | ICD-10-CM

## 2022-08-28 DIAGNOSIS — E1140 Type 2 diabetes mellitus with diabetic neuropathy, unspecified: Secondary | ICD-10-CM | POA: Diagnosis not present

## 2022-08-28 DIAGNOSIS — Z794 Long term (current) use of insulin: Secondary | ICD-10-CM

## 2022-08-28 MED ORDER — MELOXICAM 15 MG PO TABS
15.0000 mg | ORAL_TABLET | Freq: Every day | ORAL | 1 refills | Status: DC
  Filled 2022-08-28: qty 30, 30d supply, fill #0

## 2022-08-28 MED ORDER — PHENTERMINE HCL 37.5 MG PO CAPS
37.5000 mg | ORAL_CAPSULE | ORAL | 0 refills | Status: DC
  Filled 2022-08-28: qty 30, 30d supply, fill #0

## 2022-08-28 MED ORDER — LOSARTAN POTASSIUM 50 MG PO TABS
50.0000 mg | ORAL_TABLET | Freq: Every day | ORAL | 0 refills | Status: DC
  Filled 2022-08-28: qty 30, 30d supply, fill #0
  Filled 2022-10-25 – 2022-10-27 (×3): qty 30, 30d supply, fill #1

## 2022-08-28 NOTE — Progress Notes (Signed)
-  weight loss -patient has cough  for x1 month that will not go away - patient c/o arthritis in knee

## 2022-08-30 ENCOUNTER — Encounter: Payer: Self-pay | Admitting: Family Medicine

## 2022-08-30 DIAGNOSIS — E785 Hyperlipidemia, unspecified: Secondary | ICD-10-CM

## 2022-08-30 MED ORDER — ROSUVASTATIN CALCIUM 20 MG PO TABS: 20.0000 mg | ORAL_TABLET | Freq: Every day | ORAL | 1 refills | Status: DC

## 2022-08-30 NOTE — Telephone Encounter (Signed)
Ok to reorder

## 2022-09-04 ENCOUNTER — Encounter: Payer: Self-pay | Admitting: Family Medicine

## 2022-09-04 NOTE — Progress Notes (Signed)
Established Patient Office Visit  Subjective    Patient ID: Bradley Howe, male    DOB: 1965/08/01  Age: 57 y.o. MRN: [id number]  CC:  Chief Complaint  Patient presents with   Cough   Knee Pain    HPI Camron Monday presents for routine follow up of chronic med issues. Patient also reports a dry cough for at least a month. Patient denies fever/chills or viral sx.    Outpatient Encounter Medications as of 08/28/2022  Medication Sig   Continuous Blood Gluc Sensor (FREESTYLE LIBRE 2 SENSOR) MISC Utilize as recommended to monitor blood glucose   Dulaglutide (TRULICITY) 1.5 MG/0.5ML SOPN Inject 1.5 mg into the skin once a week.   gabapentin (NEURONTIN) 300 MG capsule Take 1 capsule (300 mg total) by mouth 3 (three) times daily.   ibuprofen (ADVIL) 600 MG tablet Take 1 tablet (600 mg total) by mouth every 8 (eight) hours as needed.   insulin NPH-regular Human (HUMULIN 70/30) (70-30) 100 UNIT/ML injection Inject 46 Units into the skin 2 (two) times daily with a meal.   lisinopril (ZESTRIL) 20 MG tablet Take 1 tablet (20 mg total) by mouth daily.   losartan (COZAAR) 50 MG tablet Take 1 tablet (50 mg total) by mouth daily.   meloxicam (MOBIC) 15 MG tablet Take 1 tablet (15 mg total) by mouth daily.   omeprazole (PRILOSEC) 20 MG capsule Take 1 capsule by mouth daily.   omeprazole (PRILOSEC) 20 MG capsule Take 1 capsule (20 mg total) by mouth daily.   ondansetron (ZOFRAN-ODT) 4 MG disintegrating tablet Take 1 tablet (4 mg total) by mouth every 8 (eight) hours as needed for nausea or vomiting.   phentermine 37.5 MG capsule Take 1 capsule (37.5 mg total) by mouth every morning.   sildenafil (VIAGRA) 100 MG tablet Take 0.5-1 tablets (50-100 mg total) by mouth daily as needed for erectile dysfunction.   tiZANidine (ZANAFLEX) 4 MG tablet Take 1 tablet (4 mg total) by mouth every 6 (six) hours as needed for muscle spasms.   [DISCONTINUED] rosuvastatin (CRESTOR) 20 MG tablet Take 1 tablet (20 mg total)  by mouth daily.   No facility-administered encounter medications on file as of 08/28/2022.    Past Medical History:  Diagnosis Date   GERD (gastroesophageal reflux disease)    High cholesterol    Hypertension    Type II diabetes mellitus     Past Surgical History:  Procedure Laterality Date   INGUINAL HERNIA REPAIR Left 2013    Family History  Problem Relation Age of Onset   Hypertension Mother    Hypertension Father    Diabetes Paternal Grandmother    Heart disease Neg Hx    Colon cancer Neg Hx    Pancreatic cancer Neg Hx    Esophageal cancer Neg Hx    Stomach cancer Neg Hx    Rectal cancer Neg Hx     Social History   Socioeconomic History   Marital status: Married    Spouse name: Not on file   Number of children: Not on file   Years of education: Not on file   Highest education level: 12th grade  Occupational History   Not on file  Tobacco Use   Smoking status: Former    Packs/day: 0.10    Years: 25.00    Additional pack years: 0.00    Total pack years: 2.50    Types: Cigarettes   Smokeless tobacco: Never   Tobacco comments:    "quit smoking cigarettes  in 2013"  Substance and Sexual Activity   Alcohol use: Yes    Comment: occ   Drug use: No   Sexual activity: Yes  Other Topics Concern   Not on file  Social History Narrative   Not on file   Social Determinants of Health   Financial Resource Strain: Medium Risk (08/27/2022)   Overall Financial Resource Strain (CARDIA)    Difficulty of Paying Living Expenses: Somewhat hard  Food Insecurity: No Food Insecurity (08/27/2022)   Hunger Vital Sign    Worried About Running Out of Food in the Last Year: Never true    Ran Out of Food in the Last Year: Never true  Transportation Needs: No Transportation Needs (08/27/2022)   PRAPARE - Administrator, Civil Service (Medical): No    Lack of Transportation (Non-Medical): No  Physical Activity: Insufficiently Active (08/27/2022)   Exercise Vital Sign     Days of Exercise per Week: 5 days    Minutes of Exercise per Session: 10 min  Stress: No Stress Concern Present (08/27/2022)   [employer] of Occupational Health - Occupational Stress Questionnaire    Feeling of Stress : Not at all  Social Connections: Unknown (08/27/2022)   Social Connection and Isolation Panel [NHANES]    Frequency of Communication with Friends and Family: Three times a week    Frequency of Social Gatherings with Friends and Family: Once a week    Attends Religious Services: Patient declined    Database administrator or Organizations: No    Attends Engineer, structural: Not on file    Marital Status: Married  Catering manager Violence: Not on file    Review of Systems  All other systems reviewed and are negative.       Objective    BP (!) 142/79   Pulse 79   Temp 98.2 F (36.8 C) (Oral)   Resp 16   Wt 237 lb 9.6 oz (107.8 kg)   SpO2 98%   BMI 37.21 kg/m   Physical Exam Vitals and nursing note reviewed.  Constitutional:      General: He is not in acute distress.    Appearance: He is obese.  Cardiovascular:     Rate and Rhythm: Normal rate and regular rhythm.  Pulmonary:     Effort: Pulmonary effort is normal.     Breath sounds: Normal breath sounds.  Abdominal:     Palpations: Abdomen is soft.     Tenderness: There is no abdominal tenderness.  Musculoskeletal:     Right lower leg: No edema.     Left lower leg: No edema.  Neurological:     General: No focal deficit present.     Mental Status: He is alert and oriented to person, place, and time.         Assessment & Plan:   1. Type 2 diabetes mellitus with diabetic neuropathy, with long-term current use of insulin A1c is stable and at goal. Continue  - Microalbumin / creatinine urine ratio  2. Encounter for weight management Phentermine prescribed. Goal is 4-6lbs/mo wt loss. Discussed dietary and activity options.   3. Class 2 severe obesity due to excess calories  with serious comorbidity and body mass index (BMI) of 37.0 to 37.9 in adult As above  4. ACE-inhibitor cough Most probable cause of cough. Switched to losartan 50 mg and monitor   5. Osteoarthritis of left knee, unspecified osteoarthritis type Meloxicam prescribed.     No follow-ups  on file.   Becky Sax, MD

## 2022-09-11 LAB — MICROALBUMIN / CREATININE URINE RATIO

## 2022-09-15 ENCOUNTER — Other Ambulatory Visit: Payer: Self-pay

## 2022-10-02 ENCOUNTER — Ambulatory Visit: Payer: Commercial Managed Care - HMO | Admitting: Family Medicine

## 2022-10-10 ENCOUNTER — Other Ambulatory Visit: Payer: Self-pay | Admitting: Family Medicine

## 2022-10-10 DIAGNOSIS — E1165 Type 2 diabetes mellitus with hyperglycemia: Secondary | ICD-10-CM

## 2022-10-11 ENCOUNTER — Telehealth: Payer: Self-pay | Admitting: *Deleted

## 2022-10-11 ENCOUNTER — Other Ambulatory Visit: Payer: Self-pay

## 2022-10-11 MED ORDER — HUMULIN 70/30 (70-30) 100 UNIT/ML ~~LOC~~ SUSP
46.0000 [IU] | Freq: Two times a day (BID) | SUBCUTANEOUS | 1 refills | Status: DC
  Filled 2022-10-11: qty 30, 30d supply, fill #0

## 2022-10-11 NOTE — Telephone Encounter (Signed)
I called patient to let him aware that we will  get a urine specimen when he come in around.

## 2022-10-13 ENCOUNTER — Other Ambulatory Visit: Payer: Self-pay

## 2022-10-16 ENCOUNTER — Other Ambulatory Visit: Payer: Self-pay | Admitting: Family Medicine

## 2022-10-17 ENCOUNTER — Other Ambulatory Visit: Payer: Self-pay

## 2022-10-17 MED ORDER — TRULICITY 1.5 MG/0.5ML ~~LOC~~ SOAJ
1.5000 mg | SUBCUTANEOUS | 2 refills | Status: DC
  Filled 2022-10-17: qty 2, 28d supply, fill #0

## 2022-10-19 ENCOUNTER — Other Ambulatory Visit: Payer: Self-pay

## 2022-10-24 ENCOUNTER — Other Ambulatory Visit: Payer: Self-pay | Admitting: Family Medicine

## 2022-10-26 ENCOUNTER — Other Ambulatory Visit: Payer: Self-pay

## 2022-10-26 ENCOUNTER — Other Ambulatory Visit (HOSPITAL_COMMUNITY): Payer: Self-pay

## 2022-10-27 ENCOUNTER — Other Ambulatory Visit: Payer: Self-pay

## 2022-10-28 NOTE — Progress Notes (Unsigned)
Patient ID: Bradley Howe, male    DOB: 07/30/1965  MRN: [id number]  CC: Diabetes Follow-Up  Subjective: Bradley Howe is a 57 y.o. male who presents for diabetes follow-up.   His concerns today include:  Trulicity  Insulin 70/30 Gabapentin     Patient Active Problem List   Diagnosis Date Noted   Erectile dysfunction 04/21/2021   Obesity, unspecified 08/20/2019   Ex-smoker 08/20/2019   GERD (gastroesophageal reflux disease) 08/20/2019   Elevated LFTs 08/20/2019   Angina pectoris (HCC)    Chest pain 09/17/2015   Type 2 diabetes mellitus with hyperglycemia, with long-term current use of insulin (HCC) 01/15/2013   Hypercholesterolemia 01/15/2013   Renal insufficiency 07/26/2012   Hyponatremia 07/26/2012   Essential (primary) hypertension 07/26/2012   Left inguinal hernia 05/19/2011     Current Outpatient Medications on File Prior to Visit  Medication Sig Dispense Refill   Continuous Glucose Sensor (FREESTYLE LIBRE 2 SENSOR) MISC Utilize as recommended to monitor blood glucose 2 each 5   Dulaglutide (TRULICITY) 1.5 MG/0.5ML SOPN Inject 1.5 mg into the skin once a week. 2 mL 2   gabapentin (NEURONTIN) 300 MG capsule TAKE 1 CAPSULE THREE TIMES A DAY 270 capsule 3   ibuprofen (ADVIL) 600 MG tablet Take 1 tablet (600 mg total) by mouth every 8 (eight) hours as needed. 30 tablet 0   insulin NPH-regular Human (HUMULIN 70/30) (70-30) 100 UNIT/ML injection Inject 46 Units into the skin 2 (two) times daily with a meal. 30 mL 1   lisinopril (ZESTRIL) 20 MG tablet Take 1 tablet (20 mg total) by mouth daily. 90 tablet 0   losartan (COZAAR) 50 MG tablet Take 1 tablet (50 mg total) by mouth daily. 90 tablet 0   meloxicam (MOBIC) 15 MG tablet Take 1 tablet (15 mg total) by mouth daily. 90 tablet 1   omeprazole (PRILOSEC) 20 MG capsule Take 1 capsule by mouth daily.     omeprazole (PRILOSEC) 20 MG capsule Take 1 capsule (20 mg total) by mouth daily. 90 capsule 0   ondansetron (ZOFRAN-ODT) 4  MG disintegrating tablet Take 1 tablet (4 mg total) by mouth every 8 (eight) hours as needed for nausea or vomiting. 20 tablet 0   phentermine 37.5 MG capsule Take 1 capsule (37.5 mg total) by mouth every morning. 30 capsule 0   rosuvastatin (CRESTOR) 20 MG tablet Take 1 tablet (20 mg total) by mouth daily. 90 tablet 1   sildenafil (VIAGRA) 100 MG tablet Take 0.5-1 tablets (50-100 mg total) by mouth daily as needed for erectile dysfunction. 10 tablet 0   tiZANidine (ZANAFLEX) 4 MG tablet Take 1 tablet (4 mg total) by mouth every 6 (six) hours as needed for muscle spasms. 30 tablet 0   No current facility-administered medications on file prior to visit.    Allergies  Allergen Reactions   Metformin And Related Diarrhea    Social History   Socioeconomic History   Marital status: Married    Spouse name: Not on file   Number of children: Not on file   Years of education: Not on file   Highest education level: 12th grade  Occupational History   Not on file  Tobacco Use   Smoking status: Former    Packs/day: 0.10    Years: 25.00    Additional pack years: 0.00    Total pack years: 2.50    Types: Cigarettes   Smokeless tobacco: Never   Tobacco comments:    "quit smoking cigarettes in  2013"  Substance and Sexual Activity   Alcohol use: Yes    Comment: occ   Drug use: No   Sexual activity: Yes  Other Topics Concern   Not on file  Social History Narrative   Not on file   Social Determinants of Health   Financial Resource Strain: Medium Risk (08/27/2022)   Overall Financial Resource Strain (CARDIA)    Difficulty of Paying Living Expenses: Somewhat hard  Food Insecurity: No Food Insecurity (08/27/2022)   Hunger Vital Sign    Worried About Running Out of Food in the Last Year: Never true    Ran Out of Food in the Last Year: Never true  Transportation Needs: No Transportation Needs (08/27/2022)   PRAPARE - Administrator, Civil Service (Medical): No    Lack of  Transportation (Non-Medical): No  Physical Activity: Insufficiently Active (08/27/2022)   Exercise Vital Sign    Days of Exercise per Week: 5 days    Minutes of Exercise per Session: 10 min  Stress: No Stress Concern Present (08/27/2022)   [employer] of Occupational Health - Occupational Stress Questionnaire    Feeling of Stress : Not at all  Social Connections: Unknown (08/27/2022)   Social Connection and Isolation Panel [NHANES]    Frequency of Communication with Friends and Family: Three times a week    Frequency of Social Gatherings with Friends and Family: Once a week    Attends Religious Services: Patient declined    Database administrator or Organizations: No    Attends Engineer, structural: Not on file    Marital Status: Married  Catering manager Violence: Not on file    Family History  Problem Relation Age of Onset   Hypertension Mother    Hypertension Father    Diabetes Paternal Grandmother    Heart disease Neg Hx    Colon cancer Neg Hx    Pancreatic cancer Neg Hx    Esophageal cancer Neg Hx    Stomach cancer Neg Hx    Rectal cancer Neg Hx     Past Surgical History:  Procedure Laterality Date   INGUINAL HERNIA REPAIR Left 2013    ROS: Review of Systems Negative except as stated above  PHYSICAL EXAM: There were no vitals taken for this visit.  Physical Exam  {male adult master:310786} {male adult master:310785}     Latest Ref Rng & Units 12/23/2021   10:48 AM 07/25/2021    9:23 AM 01/10/2021    5:24 PM  CMP  Glucose 70 - 99 mg/dL 782  956  213   BUN 6 - 24 mg/dL 9  7  8    Creatinine 0.76 - 1.27 mg/dL 0.86  5.78  4.69   Sodium 134 - 144 mmol/L 139  139  138   Potassium 3.5 - 5.2 mmol/L 3.9  3.7  3.8   Chloride 96 - 106 mmol/L 102  101  103   CO2 20 - 29 mmol/L 22  24  20    Calcium 8.7 - 10.2 mg/dL 9.0  9.1  9.5   Total Protein 6.0 - 8.5 g/dL 7.0  6.8  7.0   Total Bilirubin 0.0 - 1.2 mg/dL 0.6  0.5  0.5   Alkaline Phos 44 - 121 IU/L  69  63  71   AST 0 - 40 IU/L 35  32  22   ALT 0 - 44 IU/L 34  38  27    Lipid Panel  Component Value Date/Time   CHOL 177 12/23/2021 1048   TRIG 159 (H) 12/23/2021 1048   HDL 38 (L) 12/23/2021 1048   CHOLHDL 4.7 12/23/2021 1048   CHOLHDL 6.0 09/17/2015 0906   VLDL 64 (H) 09/17/2015 0906   LDLCALC 111 (H) 12/23/2021 1048    CBC    Component Value Date/Time   WBC 7.2 12/23/2021 1048   WBC 8.5 10/03/2019 0913   RBC 4.82 12/23/2021 1048   RBC 5.20 10/03/2019 0913   HGB 14.1 12/23/2021 1048   HCT 40.9 12/23/2021 1048   PLT 254 12/23/2021 1048   MCV 85 12/23/2021 1048   MCH 29.3 12/23/2021 1048   MCH 29.0 10/03/2019 0913   MCHC 34.5 12/23/2021 1048   MCHC 33.4 10/03/2019 0913   RDW 13.1 12/23/2021 1048   LYMPHSABS 2.2 12/23/2021 1048   MONOABS 0.5 01/11/2013 1741   EOSABS 0.2 12/23/2021 1048   BASOSABS 0.0 12/23/2021 1048    ASSESSMENT AND PLAN:  There are no diagnoses linked to this encounter.   Patient was given the opportunity to ask questions.  Patient verbalized understanding of the plan and was able to repeat key elements of the plan. Patient was given clear instructions to go to Emergency Department or return to medical center if symptoms don't improve, worsen, or new problems develop.The patient verbalized understanding.   No orders of the defined types were placed in this encounter.    Requested Prescriptions    No prescriptions requested or ordered in this encounter    No follow-ups on file.  Rema Fendt, NP

## 2022-10-30 ENCOUNTER — Encounter: Payer: Self-pay | Admitting: Family

## 2022-10-30 ENCOUNTER — Other Ambulatory Visit (HOSPITAL_COMMUNITY): Payer: Self-pay

## 2022-10-30 ENCOUNTER — Ambulatory Visit (INDEPENDENT_AMBULATORY_CARE_PROVIDER_SITE_OTHER): Payer: Commercial Managed Care - HMO | Admitting: Family

## 2022-10-30 VITALS — BP 143/87 | HR 86 | Temp 98.3°F | Ht 67.0 in | Wt 237.8 lb

## 2022-10-30 DIAGNOSIS — Z794 Long term (current) use of insulin: Secondary | ICD-10-CM | POA: Diagnosis not present

## 2022-10-30 DIAGNOSIS — E1165 Type 2 diabetes mellitus with hyperglycemia: Secondary | ICD-10-CM

## 2022-10-30 LAB — POCT GLYCOSYLATED HEMOGLOBIN (HGB A1C): HbA1c, POC (controlled diabetic range): 7 % (ref 0.0–7.0)

## 2022-10-30 MED ORDER — HUMULIN 70/30 (70-30) 100 UNIT/ML ~~LOC~~ SUSP
46.0000 [IU] | Freq: Two times a day (BID) | SUBCUTANEOUS | 2 refills | Status: DC
  Filled 2022-10-30: qty 20, 22d supply, fill #0
  Filled 2022-11-19: qty 30, 33d supply, fill #0
  Filled 2022-11-22: qty 30, 30d supply, fill #0
  Filled 2022-12-31: qty 30, 30d supply, fill #1
  Filled 2023-02-07: qty 30, 30d supply, fill #2

## 2022-10-30 MED ORDER — GABAPENTIN 300 MG PO CAPS
300.0000 mg | ORAL_CAPSULE | Freq: Three times a day (TID) | ORAL | 0 refills | Status: DC
  Filled 2022-10-30: qty 270, 90d supply, fill #0
  Filled 2023-06-01: qty 90, 30d supply, fill #0
  Filled 2023-07-11 – 2023-07-29 (×2): qty 90, 30d supply, fill #1
  Filled 2023-08-24: qty 90, 30d supply, fill #2

## 2022-10-30 MED ORDER — TRULICITY 1.5 MG/0.5ML ~~LOC~~ SOAJ
1.5000 mg | SUBCUTANEOUS | 2 refills | Status: DC
  Filled 2022-10-30 – 2022-11-19 (×2): qty 2, 28d supply, fill #0
  Filled 2022-12-19: qty 2, 28d supply, fill #1
  Filled 2023-01-12: qty 2, 28d supply, fill #2

## 2022-10-30 NOTE — Progress Notes (Signed)
PT states pain in toes

## 2022-10-31 ENCOUNTER — Other Ambulatory Visit: Payer: Self-pay

## 2022-11-07 ENCOUNTER — Other Ambulatory Visit: Payer: Self-pay

## 2022-11-08 ENCOUNTER — Other Ambulatory Visit: Payer: Self-pay | Admitting: *Deleted

## 2022-11-08 MED ORDER — LOSARTAN POTASSIUM 50 MG PO TABS: 50.0000 mg | ORAL_TABLET | Freq: Every day | ORAL | 0 refills | Status: DC

## 2022-11-09 ENCOUNTER — Telehealth: Payer: Self-pay | Admitting: *Deleted

## 2022-11-09 NOTE — Telephone Encounter (Signed)
Patient was identified Patient was called and informed paperwork was filled out and ready for pickup 

## 2022-11-19 ENCOUNTER — Other Ambulatory Visit: Payer: Self-pay | Admitting: Nurse Practitioner

## 2022-11-19 DIAGNOSIS — K0889 Other specified disorders of teeth and supporting structures: Secondary | ICD-10-CM

## 2022-11-20 ENCOUNTER — Other Ambulatory Visit (HOSPITAL_COMMUNITY): Payer: Self-pay

## 2022-11-20 ENCOUNTER — Other Ambulatory Visit: Payer: Self-pay

## 2022-11-21 ENCOUNTER — Other Ambulatory Visit: Payer: Self-pay

## 2022-11-22 ENCOUNTER — Other Ambulatory Visit: Payer: Self-pay

## 2022-11-23 ENCOUNTER — Other Ambulatory Visit (HOSPITAL_COMMUNITY): Payer: Self-pay

## 2022-12-17 ENCOUNTER — Other Ambulatory Visit: Payer: Self-pay | Admitting: Family Medicine

## 2023-01-01 ENCOUNTER — Encounter: Payer: Self-pay | Admitting: Internal Medicine

## 2023-01-01 ENCOUNTER — Other Ambulatory Visit: Payer: Self-pay

## 2023-01-12 ENCOUNTER — Other Ambulatory Visit: Payer: Self-pay

## 2023-01-17 ENCOUNTER — Other Ambulatory Visit: Payer: Self-pay

## 2023-01-25 ENCOUNTER — Other Ambulatory Visit: Payer: Self-pay

## 2023-01-25 NOTE — Progress Notes (Signed)
Patient attempted to be outreached by Sofie Rower, PharmD to discuss hypertension. Left voicemail for patient to return our call at their convenience at [phone]  Sofie Rower, PharmD [facility] PGY1

## 2023-01-26 ENCOUNTER — Encounter: Payer: Self-pay | Admitting: Pharmacist

## 2023-02-02 ENCOUNTER — Telehealth: Payer: Self-pay

## 2023-02-02 NOTE — Progress Notes (Signed)
Patient attempted to be outreached by Sofie Rower, PharmD to discuss hypertension. Left voicemail for patient to return our call at their convenience at [phone]  Sofie Rower, PharmD, [facility] PGY1

## 2023-02-05 ENCOUNTER — Ambulatory Visit (INDEPENDENT_AMBULATORY_CARE_PROVIDER_SITE_OTHER): Payer: Commercial Managed Care - HMO | Admitting: Family Medicine

## 2023-02-05 ENCOUNTER — Encounter: Payer: Self-pay | Admitting: Family Medicine

## 2023-02-05 VITALS — BP 134/84 | HR 80 | Temp 98.0°F | Resp 16 | Wt 234.0 lb

## 2023-02-05 DIAGNOSIS — E1165 Type 2 diabetes mellitus with hyperglycemia: Secondary | ICD-10-CM | POA: Diagnosis not present

## 2023-02-05 DIAGNOSIS — Z794 Long term (current) use of insulin: Secondary | ICD-10-CM

## 2023-02-05 DIAGNOSIS — Z23 Encounter for immunization: Secondary | ICD-10-CM | POA: Diagnosis not present

## 2023-02-05 DIAGNOSIS — Z6836 Body mass index (BMI) 36.0-36.9, adult: Secondary | ICD-10-CM

## 2023-02-05 LAB — POCT GLYCOSYLATED HEMOGLOBIN (HGB A1C): Hemoglobin A1C: 6.4 % — AB (ref 4.0–5.6)

## 2023-02-06 NOTE — Progress Notes (Signed)
Established Patient Office Visit  Subjective    Patient ID: Bradley Howe, male    DOB: 1966-04-01  Age: 57 y.o. MRN: [id number]  CC:  Chief Complaint  Patient presents with   Medical Management of Chronic Issues    HPI Bradley Howe presents for follow up of chronic med issues. Patient denies acute complaints or concerns.   Outpatient Encounter Medications as of 02/05/2023  Medication Sig   Continuous Glucose Sensor (FREESTYLE LIBRE 2 SENSOR) MISC Utilize as recommended to monitor blood glucose.Change every 14 days.   Dulaglutide (TRULICITY) 1.5 MG/0.5ML SOPN Inject 1.5 mg into the skin once a week.   gabapentin (NEURONTIN) 300 MG capsule Take 1 capsule (300 mg total) by mouth 3 (three) times daily.   ibuprofen (ADVIL) 600 MG tablet Take 1 tablet (600 mg total) by mouth every 8 (eight) hours as needed.   insulin NPH-regular Human (HUMULIN 70/30) (70-30) 100 UNIT/ML injection Inject 46 Units into the skin 2 (two) times daily with a meal.   losartan (COZAAR) 50 MG tablet Take 1 tablet (50 mg total) by mouth daily.   omeprazole (PRILOSEC) 20 MG capsule Take 1 capsule by mouth daily.   omeprazole (PRILOSEC) 20 MG capsule Take 1 capsule (20 mg total) by mouth daily.   ondansetron (ZOFRAN-ODT) 4 MG disintegrating tablet Take 1 tablet (4 mg total) by mouth every 8 (eight) hours as needed for nausea or vomiting.   rosuvastatin (CRESTOR) 20 MG tablet Take 1 tablet (20 mg total) by mouth daily.   [DISCONTINUED] lisinopril (ZESTRIL) 20 MG tablet Take 1 tablet (20 mg total) by mouth daily. (Patient not taking: Reported on 10/30/2022)   [DISCONTINUED] meloxicam (MOBIC) 15 MG tablet Take 1 tablet (15 mg total) by mouth daily. (Patient not taking: Reported on 10/30/2022)   [DISCONTINUED] phentermine 37.5 MG capsule Take 1 capsule (37.5 mg total) by mouth every morning. (Patient not taking: Reported on 10/30/2022)   [DISCONTINUED] sildenafil (VIAGRA) 100 MG tablet Take 0.5-1 tablets (50-100 mg total) by  mouth daily as needed for erectile dysfunction. (Patient not taking: Reported on 10/30/2022)   [DISCONTINUED] tiZANidine (ZANAFLEX) 4 MG tablet Take 1 tablet (4 mg total) by mouth every 6 (six) hours as needed for muscle spasms. (Patient not taking: Reported on 10/30/2022)   No facility-administered encounter medications on file as of 02/05/2023.    Past Medical History:  Diagnosis Date   GERD (gastroesophageal reflux disease)    High cholesterol    Hypertension    Type II diabetes mellitus (HCC)     Past Surgical History:  Procedure Laterality Date   INGUINAL HERNIA REPAIR Left 2013    Family History  Problem Relation Age of Onset   Hypertension Mother    Hypertension Father    Diabetes Paternal Grandmother    Heart disease Neg Hx    Colon cancer Neg Hx    Pancreatic cancer Neg Hx    Esophageal cancer Neg Hx    Stomach cancer Neg Hx    Rectal cancer Neg Hx     Social History   Socioeconomic History   Marital status: Married    Spouse name: Not on file   Number of children: Not on file   Years of education: Not on file   Highest education level: 12th grade  Occupational History   Not on file  Tobacco Use   Smoking status: Former    Current packs/day: 0.10    Average packs/day: 0.1 packs/day for 25.0 years (2.5 ttl pk-yrs)  Types: Cigarettes   Smokeless tobacco: Never   Tobacco comments:    "quit smoking cigarettes in 2013"  Substance and Sexual Activity   Alcohol use: Yes    Comment: occ   Drug use: No   Sexual activity: Yes  Other Topics Concern   Not on file  Social History Narrative   Not on file   Social Determinants of Health   Financial Resource Strain: Medium Risk (08/27/2022)   Overall Financial Resource Strain (CARDIA)    Difficulty of Paying Living Expenses: Somewhat hard  Food Insecurity: No Food Insecurity (08/27/2022)   Hunger Vital Sign    Worried About Running Out of Food in the Last Year: Never true    Ran Out of Food in the Last Year:  Never true  Transportation Needs: No Transportation Needs (08/27/2022)   PRAPARE - Administrator, Civil Service (Medical): No    Lack of Transportation (Non-Medical): No  Physical Activity: Insufficiently Active (08/27/2022)   Exercise Vital Sign    Days of Exercise per Week: 5 days    Minutes of Exercise per Session: 10 min  Stress: No Stress Concern Present (08/27/2022)   [employer] of Occupational Health - Occupational Stress Questionnaire    Feeling of Stress : Not at all  Social Connections: Unknown (08/27/2022)   Social Connection and Isolation Panel [NHANES]    Frequency of Communication with Friends and Family: Three times a week    Frequency of Social Gatherings with Friends and Family: Once a week    Attends Religious Services: Patient declined    Database administrator or Organizations: No    Attends Engineer, structural: Not on file    Marital Status: Married  Intimate Partner Violence: Unknown (10/20/2021)   Received from [employer]   HITS    Physically Hurt: Not on file    Insult or Talk Down To: Not on file    Threaten Physical Harm: Not on file    Scream or Curse: Not on file    Review of Systems  All other systems reviewed and are negative.       Objective    BP 134/84   Pulse 80   Temp 98 F (36.7 C) (Oral)   Resp 16   Wt 234 lb (106.1 kg)   SpO2 94%   BMI 36.65 kg/m   Physical Exam Vitals and nursing note reviewed.  Constitutional:      General: He is not in acute distress.    Appearance: He is obese.  Cardiovascular:     Rate and Rhythm: Normal rate and regular rhythm.  Pulmonary:     Effort: Pulmonary effort is normal.     Breath sounds: Normal breath sounds.  Abdominal:     Palpations: Abdomen is soft.     Tenderness: There is no abdominal tenderness.  Musculoskeletal:     Right lower leg: No edema.     Left lower leg: No edema.  Neurological:     General: No focal deficit present.      Mental Status: He is alert and oriented to person, place, and time.         Assessment & Plan:   Type 2 diabetes mellitus with hyperglycemia, with long-term current use of insulin (HCC) -     POCT glycosylated hemoglobin (Hb A1C)  Class 2 severe obesity due to excess calories with serious comorbidity and body mass index (BMI) of 36.0 to 36.9 in adult (  HCC)  Encounter for immunization -     Flu Vaccine Trivalent High Dose (Fluad)     Return in about 3 months (around 05/07/2023) for follow up.   Tommie Raymond, MD

## 2023-02-07 ENCOUNTER — Other Ambulatory Visit: Payer: Self-pay | Admitting: Family

## 2023-02-07 DIAGNOSIS — E1165 Type 2 diabetes mellitus with hyperglycemia: Secondary | ICD-10-CM

## 2023-02-08 ENCOUNTER — Other Ambulatory Visit: Payer: Self-pay

## 2023-02-08 DIAGNOSIS — E1165 Type 2 diabetes mellitus with hyperglycemia: Secondary | ICD-10-CM

## 2023-02-08 MED ORDER — TRULICITY 1.5 MG/0.5ML ~~LOC~~ SOAJ: 1.5000 mg | SUBCUTANEOUS | 2 refills | Status: DC

## 2023-02-12 ENCOUNTER — Other Ambulatory Visit: Payer: Self-pay

## 2023-02-23 ENCOUNTER — Ambulatory Visit: Payer: Managed Care, Other (non HMO)

## 2023-02-23 VITALS — Ht 67.0 in | Wt 240.0 lb

## 2023-02-23 DIAGNOSIS — Z860100 Personal history of colon polyps, unspecified: Secondary | ICD-10-CM

## 2023-02-23 MED ORDER — NA SULFATE-K SULFATE-MG SULF 17.5-3.13-1.6 GM/177ML PO SOLN: 1.0000 | Freq: Once | ORAL | 0 refills | Status: AC

## 2023-02-23 NOTE — Progress Notes (Signed)
No egg or soy allergy known to patient  No issues known to pt with past sedation with any surgeries or procedures Patient denies ever being told they had issues or difficulty with intubation  No FH of Malignant Hyperthermia Pt is not on diet pills Pt is not on  home 02  Pt is not on blood thinners  Pt denies issues with constipation  No A fib or A flutter Have any cardiac testing pending--no  LOA: independent  Prep: suprep   Patient's chart reviewed by Cathlyn Parsons CNRA prior to previsit and patient appropriate for the [facility].  Previsit completed and red dot placed by patient's name on their procedure day (on provider's schedule).     PV competed with patient. Prep instructions sent via mychart and home address. Goodrx coupon for provided to use for price reduction if needed.

## 2023-03-12 ENCOUNTER — Encounter: Payer: Self-pay | Admitting: Internal Medicine

## 2023-03-23 ENCOUNTER — Encounter: Payer: Commercial Managed Care - HMO | Admitting: Internal Medicine

## 2023-03-23 ENCOUNTER — Telehealth: Payer: Self-pay | Admitting: Internal Medicine

## 2023-03-23 NOTE — Telephone Encounter (Signed)
Good Morning Dr. Marina Goodell  I called this patient at 8:15 am to see if he was comeing for his procedure.  He stated he did not have a care partner.    I will NO SHOW him    [place]

## 2023-04-01 ENCOUNTER — Other Ambulatory Visit: Payer: Self-pay | Admitting: Family

## 2023-04-01 DIAGNOSIS — E1165 Type 2 diabetes mellitus with hyperglycemia: Secondary | ICD-10-CM

## 2023-04-03 ENCOUNTER — Telehealth: Payer: Self-pay | Admitting: Family Medicine

## 2023-04-03 NOTE — Telephone Encounter (Signed)
Copied from CRM [id number]. Topic: General - Other >> Apr 03, 2023  1:48 PM Dondra Prader E wrote: Reason for CRM: Pt called following up on request for insulin NPH-regular Human (HUMULIN 70/30) (70-30) 100 UNIT/ML injection he says he is completely out of his current supply.

## 2023-04-04 ENCOUNTER — Other Ambulatory Visit: Payer: Self-pay | Admitting: Emergency Medicine

## 2023-04-04 DIAGNOSIS — E1165 Type 2 diabetes mellitus with hyperglycemia: Secondary | ICD-10-CM

## 2023-04-04 MED ORDER — HUMULIN 70/30 (70-30) 100 UNIT/ML ~~LOC~~ SUSP: 46.0000 [IU] | Freq: Two times a day (BID) | SUBCUTANEOUS | 2 refills | Status: DC

## 2023-04-05 ENCOUNTER — Ambulatory Visit: Payer: Self-pay | Admitting: *Deleted

## 2023-04-05 ENCOUNTER — Telehealth: Payer: Self-pay | Admitting: Family Medicine

## 2023-04-05 ENCOUNTER — Other Ambulatory Visit: Payer: Self-pay

## 2023-04-05 ENCOUNTER — Other Ambulatory Visit: Payer: Self-pay | Admitting: Family Medicine

## 2023-04-05 DIAGNOSIS — E1165 Type 2 diabetes mellitus with hyperglycemia: Secondary | ICD-10-CM

## 2023-04-05 MED ORDER — HUMULIN 70/30 (70-30) 100 UNIT/ML ~~LOC~~ SUSP
46.0000 [IU] | Freq: Two times a day (BID) | SUBCUTANEOUS | 2 refills | Status: DC
  Filled 2023-04-05 – 2023-05-16 (×2): qty 30, 33d supply, fill #0
  Filled 2023-06-18: qty 30, 33d supply, fill #1

## 2023-04-05 NOTE — Telephone Encounter (Signed)
done

## 2023-04-05 NOTE — Telephone Encounter (Signed)
Medication already sent to [facility] per request of previous encounter.

## 2023-04-05 NOTE — Telephone Encounter (Signed)
Reason for Disposition . [1] Blood glucose > 300 mg/dL (82.9 mmol/L) AND [5] uses insulin (e.g., insulin-dependent, all people with type 1 diabetes)  Answer Assessment - Initial Assessment Questions 1. BLOOD GLUCOSE: "What is your blood glucose level?"      320 2. ONSET: "When did you check the blood glucose?"     5:30 3. USUAL RANGE: "What is your glucose level usually?" (e.g., usual fasting morning value, usual evening value)     115-126 4. KETONES: "Do you check for ketones (urine or blood test strips)?" If Yes, ask: "What does the test show now?"      na 5. TYPE 1 or 2:  "Do you know what type of diabetes you have?"  (e.g., Type 1, Type 2, Gestational; doesn't know)      Type 2 6. INSULIN: "Do you take insulin?" "What type of insulin(s) do you use? What is the mode of delivery? (syringe, pen; injection or pump)?"      humulin 7. DIABETES PILLS: "Do you take any pills for your diabetes?" If Yes, ask: "Have you missed taking any pills recently?"     no 8. OTHER SYMPTOMS: "Do you have any symptoms?" (e.g., fever, frequent urination, difficulty breathing, dizziness, weakness, vomiting)     fatigue  Protocols used: Diabetes - High Blood Sugar-A-AH

## 2023-04-05 NOTE — Telephone Encounter (Signed)
Pt is calling in because he wants to cancel the refill request of insulin NPH-regular Human (HUMULIN 70/30) (70-30) 100 UNIT/ML injection because it has already been filled at [facility] and he needs the medication.

## 2023-04-05 NOTE — Telephone Encounter (Signed)
[facility] called and spoke to [place], Pensions consultant about the refill(s) insulin 70/30 requested. Advised it was sent on 04/04/23 #30 ml/2 refill(s). She says they have it and it's out of stock and supposed to be in today. If it arrives, the refill will be ready today after 3pm.

## 2023-04-05 NOTE — Telephone Encounter (Signed)
Medication Refill -  Most Recent Primary Care Visit:  Provider: Georganna Skeans  Department: [facility]  Visit Type: OFFICE VISIT  Date: 02/05/2023  Medication: insulin NPH-regular Human (HUMULIN 70/30) (70-30) 100 UNIT/ML injection  Medication sent to wrong pharmacy. Can it be sent to pharmacy below.   Has the patient contacted their pharmacy? Yes (Agent: If yes, when and what did the pharmacy advise?) Did not have prescription, reach out to office.  Is this the correct pharmacy for this prescription? Yes If no, delete pharmacy and type the correct one.  This is the patient's preferred pharmacy:   [facility] [address] [place] [address] Phone: [phone] Fax: [phone]  Has the prescription been filled recently? Yes  Is the patient out of the medication? Yes Pt has been out of insulin for 3 days.   Has the patient been seen for an appointment in the last year OR does the patient have an upcoming appointment? Yes  Can we respond through MyChart? Yes  Agent: Please be advised that Rx refills may take up to 3 business days. We ask that you follow-up with your pharmacy.

## 2023-04-05 NOTE — Telephone Encounter (Signed)
Second attempt to contact patient- no answer- left message to call office. See note below for Rx info.

## 2023-04-05 NOTE — Telephone Encounter (Signed)
  Chief Complaint: Patient was not aware that his medication had been refilled Symptoms: fatigue, elevated glucose Frequency: 2 days Pertinent Negatives: Patient denies dizziness, headache blurred vision, confusion Disposition: [] ED /[] Urgent Care (no appt availability in office) / [] Appointment(In office/virtual)/ []  [facility]/ [x] Home Care/ [] Refused Recommended Disposition /[] [place] [facility]/ []  Follow-up with PCP Additional Notes: Patient is going to contact pharmacy now- he will contact office if Rx is not at pharmacy- it should be ready for his pick up. Patient will pick up and restart as soon as possible.

## 2023-04-05 NOTE — Telephone Encounter (Signed)
Third attempt to call patient- no answer- left message to call office.

## 2023-04-05 NOTE — Telephone Encounter (Signed)
Summary: BS reading 320, no insulin in two days   Pt. Albarracin called and stated he has been out of insulin for two days. His Blood Sugar is reading 320.     Attempted to call patient- no answer- left message to call office- looks like medication was called to pharmacy yesterday:   insulin NPH-regular Human (HUMULIN 70/30) (70-30) 100 UNIT/ML injection 30 mL 2 04/04/2023 --   Sig - Route: Inject 46 Units into the skin 2 (two) times daily with a meal. - Subcutaneous   Sent to pharmacy as: insulin NPH-regular Human (HUMULIN 70/30) (70-30) 100 UNIT/ML injection   E-Prescribing Status: Receipt confirmed by pharmacy (04/04/2023 10:54 AM EST)

## 2023-04-05 NOTE — Telephone Encounter (Signed)
Medication Refill -  Most Recent Primary Care Visit:  Provider: Georganna Skeans  Department: [facility]  Visit Type: OFFICE VISIT  Date: 02/05/2023  Medication: insulin NPH-regular Human (HUMULIN 70/30) (70-30) 100 UNIT/ML injection [[id number]]   Has the patient contacted their pharmacy? Yes (Agent: If no, request that the patient contact the pharmacy for the refill. If patient does not wish to contact the pharmacy document the reason why and proceed with request.) (Agent: If yes, when and what did the pharmacy advise?)  Is this the correct pharmacy for this prescription? Yes If no, delete pharmacy and type the correct one.  This is the patient's preferred pharmacy:  [facility] [address], [place] - [address] [place] [address] Phone: [phone] Fax: [phone]   Has the prescription been filled recently? Yes  Is the patient out of the medication? Yes  Has the patient been seen for an appointment in the last year OR does the patient have an upcoming appointment? Yes  Can we respond through MyChart? Yes  Agent: Please be advised that Rx refills may take up to 3 business days. We ask that you follow-up with your pharmacy.

## 2023-04-05 NOTE — Telephone Encounter (Signed)
Requested Prescriptions  Pending Prescriptions Disp Refills   insulin NPH-regular Human (HUMULIN 70/30) (70-30) 100 UNIT/ML injection 30 mL 2    Sig: Inject 46 Units into the skin 2 (two) times daily with a meal.     Endocrinology:  Diabetes - Insulins Passed - 04/05/2023  3:40 PM      Passed - HBA1C is between 0 and 7.9 and within 180 days    Hemoglobin A1C  Date Value Ref Range Status  02/05/2023 6.4 (A) 4.0 - 5.6 % Final   HbA1c, POC (controlled diabetic range)  Date Value Ref Range Status  10/30/2022 7.0 0.0 - 7.0 % Final         Passed - Valid encounter within last 6 months    Recent Outpatient Visits           1 month ago Type 2 diabetes mellitus with hyperglycemia, with long-term current use of insulin (HCC)   [name] [facility], Lauris Poag, MD   5 months ago Type 2 diabetes mellitus with hyperglycemia, with long-term current use of insulin (HCC)   [place] [facility], Amy J, NP   7 months ago Type 2 diabetes mellitus with diabetic neuropathy, with long-term current use of insulin (HCC)   [place] [facility], Lauris Poag, MD   8 months ago Type 2 diabetes mellitus with diabetic neuropathy, with long-term current use of insulin (HCC)   [place] [facility], Lauris Poag, MD   11 months ago Type 2 diabetes mellitus with diabetic neuropathy, with long-term current use of insulin [facility])   [place] [facility], MD       Future Appointments             In 1 month Georganna Skeans, MD [facility]

## 2023-04-25 ENCOUNTER — Other Ambulatory Visit: Payer: Self-pay | Admitting: Family

## 2023-04-25 DIAGNOSIS — E1165 Type 2 diabetes mellitus with hyperglycemia: Secondary | ICD-10-CM

## 2023-05-16 ENCOUNTER — Other Ambulatory Visit: Payer: Self-pay | Admitting: Family

## 2023-05-16 DIAGNOSIS — E1165 Type 2 diabetes mellitus with hyperglycemia: Secondary | ICD-10-CM

## 2023-05-17 ENCOUNTER — Other Ambulatory Visit: Payer: Self-pay

## 2023-05-18 ENCOUNTER — Other Ambulatory Visit: Payer: Self-pay

## 2023-05-20 ENCOUNTER — Other Ambulatory Visit: Payer: Self-pay | Admitting: Family Medicine

## 2023-05-20 DIAGNOSIS — E1165 Type 2 diabetes mellitus with hyperglycemia: Secondary | ICD-10-CM

## 2023-05-21 ENCOUNTER — Other Ambulatory Visit: Payer: Self-pay

## 2023-05-21 NOTE — Telephone Encounter (Signed)
 Complete

## 2023-05-22 ENCOUNTER — Encounter: Payer: Commercial Managed Care - HMO | Admitting: Family Medicine

## 2023-05-27 ENCOUNTER — Other Ambulatory Visit: Payer: Self-pay | Admitting: Family Medicine

## 2023-05-27 DIAGNOSIS — E785 Hyperlipidemia, unspecified: Secondary | ICD-10-CM

## 2023-05-29 ENCOUNTER — Other Ambulatory Visit: Payer: Self-pay

## 2023-06-01 ENCOUNTER — Telehealth: Payer: Self-pay | Admitting: Family Medicine

## 2023-06-01 ENCOUNTER — Other Ambulatory Visit: Payer: Self-pay

## 2023-06-01 ENCOUNTER — Other Ambulatory Visit: Payer: Self-pay | Admitting: Family Medicine

## 2023-06-01 DIAGNOSIS — E1165 Type 2 diabetes mellitus with hyperglycemia: Secondary | ICD-10-CM

## 2023-06-01 NOTE — Telephone Encounter (Signed)
Copied from CRM [id number]. Topic: General - Other >> Jun 01, 2023  8:25 AM Phill Myron wrote: Pt . Orpah Greek calling stating his new insurance is   [employer]   id# [id number]     Group# [id number]  He also stated he needs authorization for the medication:TRULICITY 1.5 MG/0.5ML SOAJ [[id number]   Please advise

## 2023-06-01 NOTE — Telephone Encounter (Signed)
Pharmacy Patient Advocate Encounter   Received notification from Physician's Office that prior authorization for TRULICITY is required/requested.   Insurance verification completed.   The patient is insured through [facility] MEDICAID.   Per test claim: PA required; PA submitted to above mentioned insurance via CoverMyMeds Key/confirmation #/EOC [id number] Status is pending

## 2023-06-01 NOTE — Telephone Encounter (Signed)
I have added insurance plan for pt. Please advise on authorization for medication.

## 2023-06-04 ENCOUNTER — Other Ambulatory Visit: Payer: Self-pay

## 2023-06-07 ENCOUNTER — Other Ambulatory Visit: Payer: Self-pay

## 2023-06-08 ENCOUNTER — Other Ambulatory Visit: Payer: Self-pay

## 2023-06-18 ENCOUNTER — Other Ambulatory Visit: Payer: Self-pay

## 2023-06-27 ENCOUNTER — Other Ambulatory Visit: Payer: Self-pay | Admitting: Family Medicine

## 2023-06-27 ENCOUNTER — Other Ambulatory Visit: Payer: Self-pay

## 2023-06-27 DIAGNOSIS — E1165 Type 2 diabetes mellitus with hyperglycemia: Secondary | ICD-10-CM

## 2023-06-27 DIAGNOSIS — Z794 Long term (current) use of insulin: Secondary | ICD-10-CM

## 2023-06-27 NOTE — Telephone Encounter (Signed)
Copied from CRM [id number]. Topic: Clinical - Medication Refill >> Jun 27, 2023  2:08 PM Yolanda T wrote: Most Recent Primary Care Visit:  Provider: Georganna Skeans  Department: [facility]  Visit Type: OFFICE VISIT  Date: 02/05/2023  Medication:  Dulaglutide (TRULICITY) 1.5 MG/0.5ML SOAJ   Has the patient contacted their pharmacy? No  Is this the correct pharmacy for this prescription? Yes  This is the patient's preferred pharmacy:  [facility] [address], [place] - [address] [place] [address] Phone: [phone] Fax: [phone]  Has the prescription been filled recently? Yes  Is the patient out of the medication? Yes  Has the patient been seen for an appointment in the last year OR does the patient have an upcoming appointment? Yes  Can we respond through MyChart? Yes  Agent: Please be advised that Rx refills may take up to 3 business days. We ask that you follow-up with your pharmacy.

## 2023-06-27 NOTE — Telephone Encounter (Signed)
Last Fill: 05/21/23  Last OV: 02/05/23 Next OV: 07/23/23  Routing to provider for review/authorization.

## 2023-06-29 ENCOUNTER — Telehealth: Payer: Self-pay | Admitting: *Deleted

## 2023-06-29 ENCOUNTER — Other Ambulatory Visit: Payer: Self-pay | Admitting: *Deleted

## 2023-06-29 MED ORDER — TRULICITY 1.5 MG/0.5ML ~~LOC~~ SOAJ: 1.5000 mg | SUBCUTANEOUS | 0 refills | Status: DC

## 2023-06-29 NOTE — Telephone Encounter (Signed)
Medication refill request LOV 01/2023

## 2023-07-02 ENCOUNTER — Other Ambulatory Visit: Payer: Self-pay | Admitting: Family Medicine

## 2023-07-03 ENCOUNTER — Other Ambulatory Visit: Payer: Self-pay

## 2023-07-03 MED ORDER — FREESTYLE LIBRE 2 SENSOR MISC
1 refills | Status: DC
  Filled 2023-07-03: qty 2, 28d supply, fill #0
  Filled 2023-07-29: qty 2, 28d supply, fill #1
  Filled 2023-08-24: qty 2, 28d supply, fill #2
  Filled 2023-10-11: qty 2, 28d supply, fill #3

## 2023-07-03 NOTE — Telephone Encounter (Signed)
 Requested Prescriptions  Pending Prescriptions Disp Refills   Continuous Glucose Sensor (FREESTYLE LIBRE 2 SENSOR) MISC 6 each 1    Sig: Utilize as recommended to monitor blood glucose.Change every 14 days.     Endocrinology: Diabetes - Testing Supplies Passed - 07/03/2023  8:41 AM      Passed - Valid encounter within last 12 months    Recent Outpatient Visits           4 months ago Type 2 diabetes mellitus with hyperglycemia, with long-term current use of insulin (HCC)   [place] [facility], Lauris Poag, MD   8 months ago Type 2 diabetes mellitus with hyperglycemia, with long-term current use of insulin [facility])   [place] [facility], Amy J, NP   10 months ago Type 2 diabetes mellitus with diabetic neuropathy, with long-term current use of insulin (HCC)   [place] [facility], Lauris Poag, MD   11 months ago Type 2 diabetes mellitus with diabetic neuropathy, with long-term current use of insulin (HCC)   [place] [facility] at [facility], Lauris Poag, MD   1 year ago Type 2 diabetes mellitus with diabetic neuropathy, with long-term current use of insulin [facility])   [place] [facility] at [facility], MD       Future Appointments             In 2 weeks Georganna Skeans, MD [facility]

## 2023-07-04 ENCOUNTER — Encounter: Payer: Self-pay | Admitting: Internal Medicine

## 2023-07-08 ENCOUNTER — Other Ambulatory Visit: Payer: Self-pay

## 2023-07-08 ENCOUNTER — Emergency Department (HOSPITAL_COMMUNITY): Payer: No Typology Code available for payment source

## 2023-07-08 ENCOUNTER — Inpatient Hospital Stay (HOSPITAL_COMMUNITY)
Admission: EM | Admit: 2023-07-08 | Discharge: 2023-07-10 | DRG: 638 | Disposition: A | Discharge status: Home / Self Care | Payer: No Typology Code available for payment source | Attending: Internal Medicine | Admitting: Internal Medicine

## 2023-07-08 ENCOUNTER — Encounter (HOSPITAL_COMMUNITY): Payer: Self-pay

## 2023-07-08 DIAGNOSIS — Z8249 Family history of ischemic heart disease and other diseases of the circulatory system: Secondary | ICD-10-CM

## 2023-07-08 DIAGNOSIS — F1411 Cocaine abuse, in remission: Secondary | ICD-10-CM | POA: Diagnosis present

## 2023-07-08 DIAGNOSIS — Z79899 Other long term (current) drug therapy: Secondary | ICD-10-CM

## 2023-07-08 DIAGNOSIS — N179 Acute kidney failure, unspecified: Secondary | ICD-10-CM | POA: Diagnosis present

## 2023-07-08 DIAGNOSIS — Z888 Allergy status to other drugs, medicaments and biological substances status: Secondary | ICD-10-CM

## 2023-07-08 DIAGNOSIS — Z6837 Body mass index (BMI) 37.0-37.9, adult: Secondary | ICD-10-CM

## 2023-07-08 DIAGNOSIS — K219 Gastro-esophageal reflux disease without esophagitis: Secondary | ICD-10-CM | POA: Diagnosis present

## 2023-07-08 DIAGNOSIS — E162 Hypoglycemia, unspecified: Principal | ICD-10-CM

## 2023-07-08 DIAGNOSIS — Z7985 Long-term (current) use of injectable non-insulin antidiabetic drugs: Secondary | ICD-10-CM

## 2023-07-08 DIAGNOSIS — N289 Disorder of kidney and ureter, unspecified: Secondary | ICD-10-CM

## 2023-07-08 DIAGNOSIS — E871 Hypo-osmolality and hyponatremia: Secondary | ICD-10-CM | POA: Diagnosis present

## 2023-07-08 DIAGNOSIS — Z5986 Financial insecurity: Secondary | ICD-10-CM

## 2023-07-08 DIAGNOSIS — E7800 Pure hypercholesterolemia, unspecified: Secondary | ICD-10-CM | POA: Diagnosis present

## 2023-07-08 DIAGNOSIS — I10 Essential (primary) hypertension: Secondary | ICD-10-CM | POA: Diagnosis present

## 2023-07-08 DIAGNOSIS — E11649 Type 2 diabetes mellitus with hypoglycemia without coma: Secondary | ICD-10-CM | POA: Diagnosis not present

## 2023-07-08 DIAGNOSIS — I252 Old myocardial infarction: Secondary | ICD-10-CM

## 2023-07-08 DIAGNOSIS — F17210 Nicotine dependence, cigarettes, uncomplicated: Secondary | ICD-10-CM | POA: Diagnosis present

## 2023-07-08 DIAGNOSIS — Z833 Family history of diabetes mellitus: Secondary | ICD-10-CM

## 2023-07-08 DIAGNOSIS — R079 Chest pain, unspecified: Secondary | ICD-10-CM | POA: Diagnosis present

## 2023-07-08 DIAGNOSIS — R072 Precordial pain: Secondary | ICD-10-CM | POA: Diagnosis present

## 2023-07-08 DIAGNOSIS — E66812 Obesity, class 2: Secondary | ICD-10-CM | POA: Diagnosis present

## 2023-07-08 DIAGNOSIS — R197 Diarrhea, unspecified: Secondary | ICD-10-CM | POA: Diagnosis present

## 2023-07-08 DIAGNOSIS — F1410 Cocaine abuse, uncomplicated: Secondary | ICD-10-CM | POA: Diagnosis present

## 2023-07-08 LAB — CBC
HCT: 42.3 % (ref 39.0–52.0)
Hemoglobin: 14.6 g/dL (ref 13.0–17.0)
MCH: 28.4 pg (ref 26.0–34.0)
MCHC: 34.5 g/dL (ref 30.0–36.0)
MCV: 82.3 fL (ref 80.0–100.0)
Platelets: 328 10*3/uL (ref 150–400)
RBC: 5.14 MIL/uL (ref 4.22–5.81)
RDW: 13.2 % (ref 11.5–15.5)
WBC: 9.4 10*3/uL (ref 4.0–10.5)
nRBC: 0 % (ref 0.0–0.2)

## 2023-07-08 LAB — I-STAT CHEM 8, ED
BUN: 13 mg/dL (ref 6–20)
Calcium, Ion: 1.1 mmol/L — ABNORMAL LOW (ref 1.15–1.40)
Chloride: 99 mmol/L (ref 98–111)
Creatinine, Ser: 1.5 mg/dL — ABNORMAL HIGH (ref 0.61–1.24)
Glucose, Bld: 95 mg/dL (ref 70–99)
HCT: 44 % (ref 39.0–52.0)
Hemoglobin: 15 g/dL (ref 13.0–17.0)
Potassium: 3.7 mmol/L (ref 3.5–5.1)
Sodium: 133 mmol/L — ABNORMAL LOW (ref 135–145)
TCO2: 20 mmol/L — ABNORMAL LOW (ref 22–32)

## 2023-07-08 LAB — CBG MONITORING, ED
Glucose-Capillary: 124 mg/dL — ABNORMAL HIGH (ref 70–99)
Glucose-Capillary: 44 mg/dL — CL (ref 70–99)
Glucose-Capillary: 94 mg/dL (ref 70–99)
Glucose-Capillary: 51 mg/dL — ABNORMAL LOW (ref 70–99)
Glucose-Capillary: 70 mg/dL (ref 70–99)
Glucose-Capillary: 84 mg/dL (ref 70–99)

## 2023-07-08 LAB — TROPONIN I (HIGH SENSITIVITY)
Troponin I (High Sensitivity): 12 ng/L (ref <18)
Troponin I (High Sensitivity): 14 ng/L (ref <18)

## 2023-07-08 LAB — HEPATIC FUNCTION PANEL
ALT: 27 U/L (ref 0–44)
AST: 35 U/L (ref 15–41)
Albumin: 3.7 g/dL (ref 3.5–5.0)
Alkaline Phosphatase: 55 U/L (ref 38–126)
Bilirubin, Direct: 0.2 mg/dL (ref 0.0–0.2)
Indirect Bilirubin: 0.6 mg/dL (ref 0.3–0.9)
Total Bilirubin: 0.8 mg/dL (ref 0.0–1.2)
Total Protein: 6.8 g/dL (ref 6.5–8.1)

## 2023-07-08 LAB — BASIC METABOLIC PANEL
Anion gap: 12 (ref 5–15)
BUN: 12 mg/dL (ref 6–20)
CO2: 21 mmol/L — ABNORMAL LOW (ref 22–32)
Calcium: 8.9 mg/dL (ref 8.9–10.3)
Chloride: 99 mmol/L (ref 98–111)
Creatinine, Ser: 1.4 mg/dL — ABNORMAL HIGH (ref 0.61–1.24)
GFR, Estimated: 58 mL/min — ABNORMAL LOW (ref >60)
Glucose, Bld: 97 mg/dL (ref 70–99)
Potassium: 3.6 mmol/L (ref 3.5–5.1)
Sodium: 132 mmol/L — ABNORMAL LOW (ref 135–145)

## 2023-07-08 LAB — RAPID URINE DRUG SCREEN, HOSP PERFORMED
Amphetamines: NOT DETECTED
Barbiturates: NOT DETECTED
Benzodiazepines: NOT DETECTED
Cocaine: POSITIVE — AB
Opiates: NOT DETECTED
Tetrahydrocannabinol: NOT DETECTED

## 2023-07-08 LAB — GLUCOSE, CAPILLARY
Glucose-Capillary: 45 mg/dL — ABNORMAL LOW (ref 70–99)
Glucose-Capillary: 59 mg/dL — ABNORMAL LOW (ref 70–99)
Glucose-Capillary: 96 mg/dL (ref 70–99)

## 2023-07-08 LAB — LIPASE, BLOOD: Lipase: 49 U/L (ref 11–51)

## 2023-07-08 LAB — ETHANOL: Alcohol, Ethyl (B): <10 mg/dL (ref <10)

## 2023-07-08 MED ORDER — SODIUM CHLORIDE 0.9% FLUSH
3.0000 mL | Freq: Two times a day (BID) | INTRAVENOUS | Status: DC
  Administered 2023-07-08 – 2023-07-09 (×3): 3 mL via INTRAVENOUS

## 2023-07-08 MED ORDER — DEXTROSE-SODIUM CHLORIDE 5-0.45 % IV SOLN: INTRAVENOUS | Status: DC

## 2023-07-08 MED ORDER — IOHEXOL 350 MG/ML SOLN
100.0000 mL | Freq: Once | INTRAVENOUS | Status: AC | PRN
  Administered 2023-07-08: 100 mL via INTRAVENOUS

## 2023-07-08 MED ORDER — ACETAMINOPHEN 650 MG RE SUPP: 650.0000 mg | Freq: Four times a day (QID) | RECTAL | Status: DC | PRN

## 2023-07-08 MED ORDER — DEXTROSE 10 % IV SOLN: INTRAVENOUS | Status: DC

## 2023-07-08 MED ORDER — DEXTROSE 5 % IV SOLN: Freq: Once | INTRAVENOUS | Status: AC

## 2023-07-08 MED ORDER — HYDRALAZINE HCL 20 MG/ML IJ SOLN: 10.0000 mg | INTRAMUSCULAR | Status: DC | PRN

## 2023-07-08 MED ORDER — LORAZEPAM 2 MG/ML IJ SOLN: 1.0000 mg | INTRAMUSCULAR | Status: DC | PRN

## 2023-07-08 MED ORDER — THIAMINE HCL 100 MG/ML IJ SOLN: 100.0000 mg | Freq: Every day | INTRAMUSCULAR | Status: DC

## 2023-07-08 MED ORDER — DEXTROSE 50 % IV SOLN
1.0000 | Freq: Once | INTRAVENOUS | Status: AC
  Administered 2023-07-08: 50 mL via INTRAVENOUS
  Filled 2023-07-08: qty 50

## 2023-07-08 MED ORDER — ACETAMINOPHEN 325 MG PO TABS: 650.0000 mg | ORAL_TABLET | Freq: Four times a day (QID) | ORAL | Status: DC | PRN

## 2023-07-08 MED ORDER — FOLIC ACID 1 MG PO TABS
1.0000 mg | ORAL_TABLET | Freq: Every day | ORAL | Status: DC
  Administered 2023-07-08 – 2023-07-10 (×2): 1 mg via ORAL
  Filled 2023-07-08 (×3): qty 1

## 2023-07-08 MED ORDER — LORAZEPAM 1 MG PO TABS: 1.0000 mg | ORAL_TABLET | ORAL | Status: DC | PRN

## 2023-07-08 MED ORDER — THIAMINE MONONITRATE 100 MG PO TABS
100.0000 mg | ORAL_TABLET | Freq: Every day | ORAL | Status: DC
  Administered 2023-07-08 – 2023-07-10 (×3): 100 mg via ORAL
  Filled 2023-07-08 (×3): qty 1

## 2023-07-08 MED ORDER — PANTOPRAZOLE SODIUM 40 MG IV SOLR
40.0000 mg | Freq: Two times a day (BID) | INTRAVENOUS | Status: DC
  Administered 2023-07-08 – 2023-07-10 (×4): 40 mg via INTRAVENOUS
  Filled 2023-07-08 (×4): qty 10

## 2023-07-08 MED ORDER — CHLORDIAZEPOXIDE HCL 5 MG PO CAPS
5.0000 mg | ORAL_CAPSULE | Freq: Four times a day (QID) | ORAL | Status: AC
  Administered 2023-07-08 – 2023-07-09 (×3): 5 mg via ORAL
  Filled 2023-07-08 (×4): qty 1

## 2023-07-08 MED ORDER — HEPARIN SODIUM (PORCINE) 5000 UNIT/ML IJ SOLN
5000.0000 [IU] | Freq: Two times a day (BID) | INTRAMUSCULAR | Status: DC
  Administered 2023-07-08 – 2023-07-10 (×4): 5000 [IU] via SUBCUTANEOUS
  Filled 2023-07-08 (×4): qty 1

## 2023-07-08 MED ORDER — ADULT MULTIVITAMIN W/MINERALS CH
1.0000 | ORAL_TABLET | Freq: Every day | ORAL | Status: DC
  Administered 2023-07-08 – 2023-07-10 (×2): 1 via ORAL
  Filled 2023-07-08 (×2): qty 1

## 2023-07-08 MED ORDER — MORPHINE SULFATE (PF) 2 MG/ML IV SOLN: 2.0000 mg | INTRAVENOUS | Status: DC | PRN

## 2023-07-08 MED ORDER — NICOTINE 7 MG/24HR TD PT24
7.0000 mg | MEDICATED_PATCH | Freq: Every day | TRANSDERMAL | Status: DC
Start: 2023-07-08 — End: 2023-07-10
  Administered 2023-07-08: 7 mg via TRANSDERMAL
  Filled 2023-07-08 (×3): qty 1

## 2023-07-08 NOTE — ED Provider Triage Note (Signed)
 Emergency Medicine Provider Triage Evaluation Note  Bradley Howe , a 58 y.o. male  was evaluated in triage.  Pt complains of chest pain.  Review of Systems  Positive:  Negative:   Physical Exam  BP 110/61 (BP Location: Left Arm)   Pulse 88   Temp 98.1 F (36.7 C) (Oral)   Resp 14   SpO2 94%  Gen:   Awake, no distress   Resp:  Normal effort  MSK:   Moves extremities without difficulty  Other:    Medical Decision Making  Medically screening exam initiated at 12:42 PM.  Appropriate orders placed.  [name] was informed that the remainder of the evaluation will be completed by another provider, this initial triage assessment does not replace that evaluation, and the importance of remaining in the ED until their evaluation is complete.  Chest pain radiating to the back since yesterday. Patients blood sugar monitor also frequently alarming him last night stating that his BG was low (39). Patient got shaky and had vision changes when his blood glucose was low.    Dorthy Cooler, [place] 07/08/23 1244

## 2023-07-08 NOTE — ED Provider Notes (Signed)
 Bradley Howe EMERGENCY DEPARTMENT AT [facility] Provider Note   CSN: [id number] Arrival date & time: 07/08/23  1215    History  Chief Complaint  Patient presents with   Hypoglycemia   Chest Pain   Back Pain   Diarrhea    Bradley Howe is a 58 y.o. male history of diabetes, hypercholesterolemia, GERD, hypertension here for evaluation of hypoglycemia.  Patient states she has been having low blood sugars her last 3 to 4 days.  Wears a continuous glucose monitor.  Blood sugars will dip down to low 40s.  Improved with eating however subsequently trended back down.  He has had no recent medication changes.  Takes Trulicity as well as insulin twice daily.  He is not on a sliding scale.  He does note he has had some. States he has had some diet changes over the last few weeks, has been intermittently working out.  He states he is still eating 3 meals a day.  He is doing things like switching out his white bread for wheat bread and not eating as much fast food.   CP yesterday while sitting, goes into mid back. Constant. SOB, everything resolved. Pain reproducible. 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 No pain or swelling to lower legs.  Pain not exertional, pleuritic.  Started at rest.  No abdominal pain, nausea, vomiting, diarrhea.  Currently asymptomatic.  No tobacco, no illicet substance use     HPI     Home Medications Prior to Admission medications   Medication Sig Start Date End Date Taking? Authorizing Provider  Continuous Glucose Sensor (FREESTYLE LIBRE 2 SENSOR) MISC Utilize as recommended to monitor blood glucose.Change every 14 days. 07/03/23   Georganna Skeans, MD  Dulaglutide (TRULICITY) 1.5 MG/0.5ML SOAJ Inject 1.5 mg into the skin once a week. 06/29/23 09/27/23  Georganna Skeans, MD  gabapentin (NEURONTIN) 300 MG capsule Take 1 capsule (300 mg total) by mouth 3 (three) times daily. 10/30/22   Rema Fendt, NP  ibuprofen (ADVIL) 600 MG tablet  Take 1 tablet (600 mg total) by mouth every 8 (eight) hours as needed. 04/02/21   Claiborne Rigg, NP  insulin NPH-regular Human (HUMULIN 70/30) (70-30) 100 UNIT/ML injection Inject 46 Units into the skin 2 (two) times daily with a meal. 04/05/23   Georganna Skeans, MD  losartan (COZAAR) 50 MG tablet Take 1 tablet (50 mg total) by mouth daily. 11/08/22   Georganna Skeans, MD  omeprazole (PRILOSEC) 20 MG capsule Take 1 capsule by mouth daily. Patient not taking: Reported on 02/23/2023 05/25/11   [provider]  omeprazole (PRILOSEC) 20 MG capsule Take 1 capsule (20 mg total) by mouth daily. Patient taking differently: Take 20 mg by mouth daily as needed. 04/28/22   Georganna Skeans, MD  ondansetron (ZOFRAN-ODT) 4 MG disintegrating tablet Take 1 tablet (4 mg total) by mouth every 8 (eight) hours as needed for nausea or vomiting. Patient not taking: Reported on 02/23/2023 09/27/21   Waldon Merl, PA-C  rosuvastatin (CRESTOR) 20 MG tablet Take 1 tablet by mouth once daily 05/28/23   Georganna Skeans, MD      Allergies    Metformin and related    Review of Systems   Review of Systems  Constitutional: Negative.   HENT: Negative.    Respiratory:  Positive for shortness of breath. Negative for cough.   Cardiovascular:  Positive for chest pain. Negative for palpitations and leg swelling.  Genitourinary: Negative.  Musculoskeletal: Negative.   Skin: Negative.   Neurological:        Shakiness with hypoglycemia  All other systems reviewed and are negative.   Physical Exam Updated Vital Signs BP (!) 114/58 (BP Location: Left Arm)   Pulse 89   Temp 97.8 F (36.6 C) (Oral)   Resp 19   Ht 5\' 7"  (1.702 m)   Wt 108.9 kg   SpO2 96%   BMI 37.59 kg/m  Physical Exam Vitals and nursing note reviewed.  Constitutional:      General: He is not in acute distress.    Appearance: He is well-developed. He is not ill-appearing, toxic-appearing or diaphoretic.  HENT:     Head: Normocephalic and  atraumatic.  Eyes:     Pupils: Pupils are equal, round, and reactive to light.  Cardiovascular:     Rate and Rhythm: Normal rate and regular rhythm.     Pulses:          Radial pulses are 2+ on the right side and 2+ on the left side.     Heart sounds: Normal heart sounds.  Pulmonary:     Effort: Pulmonary effort is normal. No respiratory distress.     Breath sounds: Normal breath sounds.     Comments: Clear bilaterally, speaks in full sentences without difficulty Chest:     Comments: Reproducible tenderness to central chest wall without overlying skin changes.  No crepitus Abdominal:     General: Bowel sounds are normal. There is no distension.     Palpations: Abdomen is soft.     Comments: Soft, nontender.  No midline pulsatile abdominal mass  Musculoskeletal:        General: Normal range of motion.     Cervical back: Normal range of motion and neck supple.     Right lower leg: No tenderness. No edema.     Left lower leg: No tenderness. No edema.     Comments: No bony tenderness, full range of motion, compartment soft.  No lower extremity edema  Skin:    General: Skin is warm and dry.     Capillary Refill: Capillary refill takes less than 2 seconds.  Neurological:     General: No focal deficit present.     Mental Status: He is alert and oriented to person, place, and time.     Cranial Nerves: No cranial nerve deficit.     Motor: No weakness.    ED Results / Procedures / Treatments   Labs (all labs ordered are listed, but only abnormal results are displayed) Labs Reviewed  BASIC METABOLIC PANEL - Abnormal; Notable for the following components:      Result Value   Sodium 132 (*)    CO2 21 (*)    Creatinine, Ser 1.40 (*)    GFR, Estimated 58 (*)    All other components within normal limits  RAPID URINE DRUG SCREEN, HOSP PERFORMED - Abnormal; Notable for the following components:   Cocaine POSITIVE (*)    All other components within normal limits  GLUCOSE, CAPILLARY -  Abnormal; Notable for the following components:   Glucose-Capillary 45 (*)    All other components within normal limits  GLUCOSE, CAPILLARY - Abnormal; Notable for the following components:   Glucose-Capillary 59 (*)    All other components within normal limits  CBG MONITORING, ED - Abnormal; Notable for the following components:   Glucose-Capillary 124 (*)    All other components within normal limits  I-STAT CHEM 8,  ED - Abnormal; Notable for the following components:   Sodium 133 (*)    Creatinine, Ser 1.50 (*)    Calcium, Ion 1.10 (*)    TCO2 20 (*)    All other components within normal limits  CBG MONITORING, ED - Abnormal; Notable for the following components:   Glucose-Capillary 44 (*)    All other components within normal limits  CBG MONITORING, ED - Abnormal; Notable for the following components:   Glucose-Capillary 51 (*)    All other components within normal limits  CBC  HEPATIC FUNCTION PANEL  ETHANOL  LIPASE, BLOOD  GLUCOSE, CAPILLARY  COMPREHENSIVE METABOLIC PANEL  CBC  HIV ANTIBODY (ROUTINE TESTING W REFLEX)  VITAMIN B12  HEMOGLOBIN A1C  CBG MONITORING, ED  CBG MONITORING, ED  CBG MONITORING, ED  TROPONIN I (HIGH SENSITIVITY)  TROPONIN I (HIGH SENSITIVITY)    EKG EKG Interpretation Date/Time:  Sunday July 08 2023 12:50:07 EST Ventricular Rate:  86 PR Interval:  152 QRS Duration:  74 QT Interval:  398 QTC Calculation: 476 R Axis:   52  Text Interpretation: Normal sinus rhythm Septal infarct , age undetermined Abnormal ECG  T-wave flattening otherwise no significant change from last EKG Confirmed by Elayne Snare ([id number]) on 07/08/2023 3:34:51 PM  Radiology CT Angio Chest/Abd/Pel for Dissection W and/or W/WO Result Date: 07/08/2023 CLINICAL DATA:  Aortic syndrome suspected. Chest pain. Hyperglycemia. EXAM: CT ANGIOGRAPHY CHEST, ABDOMEN AND PELVIS TECHNIQUE: Non-contrast CT of the chest was initially obtained. Multidetector CT imaging through the  chest, abdomen and pelvis was performed using the standard protocol during bolus administration of intravenous contrast. Multiplanar reconstructed images and MIPs were obtained and reviewed to evaluate the vascular anatomy. RADIATION DOSE REDUCTION: This exam was performed according to the departmental dose-optimization program which includes automated exposure control, adjustment of the mA and/or kV according to patient size and/or use of iterative reconstruction technique. CONTRAST:  OMNIPAQUE IOHEXOL 350 MG/ML SOLN COMPARISON:  None Available. FINDINGS: CTA CHEST FINDINGS Cardiovascular: Non IV contrast imaging demonstrates no intramural hematoma within the thoracic aorta. Contrast enhanced imaging demonstrates no acute findings of the aorta or great vessels. No pulmonary embolism. No pericardial fluid. No mediastinal hematoma. Mediastinum/Nodes: No axillary or supraclavicular adenopathy. No mediastinal or hilar adenopathy. No pericardial fluid. Esophagus normal. Lungs/Pleura: No pulmonary infarction. No pneumonia. No pleural fluid. No pneumothorax Musculoskeletal: No fracture. Review of the MIP images confirms the above findings. CTA ABDOMEN AND PELVIS FINDINGS VASCULAR Aorta: Normal caliber aorta without aneurysm, dissection, vasculitis or significant stenosis. Celiac: Patent without evidence of aneurysm, dissection, vasculitis or significant stenosis. SMA: Patent without evidence of aneurysm, dissection, vasculitis or significant stenosis. Renals: Both renal arteries are patent without evidence of aneurysm, dissection, vasculitis, fibromuscular dysplasia or significant stenosis. IMA: Patent without evidence of aneurysm, dissection, vasculitis or significant stenosis. Inflow: Patent without evidence of aneurysm, dissection, vasculitis or significant stenosis. Veins: No obvious venous abnormality within the limitations of this arterial phase study. Review of the MIP images confirms the above findings.  NON-VASCULAR Hepatobiliary: No focal hepatic lesion. Normal gallbladder. No biliary duct dilatation. Common bile duct is normal. Pancreas: Pancreas is normal. No ductal dilatation. No pancreatic inflammation. Spleen: Normal spleen Adrenals/urinary tract: Adrenal glands and kidneys are normal. The ureters and bladder normal. Stomach/Bowel: Stomach, small bowel, appendix, and cecum are normal. The colon and rectosigmoid colon are normal. Vascular/Lymphatic: Abdominal aorta is normal caliber. No periportal or retroperitoneal adenopathy. No pelvic adenopathy. Reproductive: Prostate unremarkable Other: No free fluid. Musculoskeletal: No aggressive osseous  lesion. Review of the MIP images confirms the above findings. IMPRESSION: CHEST: 1. No acute aortic syndrome. 2. No pulmonary embolism. 3. No acute pulmonary findings. PELVIS: 1. No acute aortic syndrome. 2. No acute findings in the abdomen pelvis. Electronically Signed   By: Genevive Bi M.D.   On: 07/08/2023 16:38    Procedures .Critical Care  Performed by: Linwood Dibbles, PA-C Authorized by: Linwood Dibbles, PA-C   Critical care provider statement:    Critical care time (minutes):  35   Critical care was necessary to treat or prevent imminent or life-threatening deterioration of the following conditions:  Endocrine crisis (Persistent hypoglycemia requiring IV infusion)   Critical care was time spent personally by me on the following activities:  Development of treatment plan with patient or surrogate, discussions with consultants, evaluation of patient's response to treatment, examination of patient, ordering and review of laboratory studies, ordering and review of radiographic studies, ordering and performing treatments and interventions, pulse oximetry, re-evaluation of patient's condition and review of old charts     Medications Ordered in ED Medications  pantoprazole (PROTONIX) injection 40 mg (40 mg Intravenous Given 07/08/23 1943)   heparin injection 5,000 Units (5,000 Units Subcutaneous Given 07/08/23 1943)  sodium chloride flush (NS) 0.9 % injection 3 mL (has no administration in time range)  acetaminophen (TYLENOL) tablet 650 mg (has no administration in time range)    Or  acetaminophen (TYLENOL) suppository 650 mg (has no administration in time range)  morphine (PF) 2 MG/ML injection 2 mg (has no administration in time range)  hydrALAZINE (APRESOLINE) injection 10 mg (has no administration in time range)  nicotine (NICODERM CQ - dosed in mg/24 hr) patch 7 mg (7 mg Transdermal Patch Applied 07/08/23 1940)  dextrose 5 % and 0.45 % NaCl infusion ( Intravenous New Bag/Given 07/08/23 2025)  LORazepam (ATIVAN) tablet 1-4 mg (has no administration in time range)    Or  LORazepam (ATIVAN) injection 1-4 mg (has no administration in time range)  thiamine (VITAMIN B1) tablet 100 mg (100 mg Oral Given 07/08/23 1939)    Or  thiamine (VITAMIN B1) injection 100 mg ( Intravenous See Alternative 07/08/23 1939)  folic acid (FOLVITE) tablet 1 mg (1 mg Oral Given 07/08/23 1939)  multivitamin with minerals tablet 1 tablet (1 tablet Oral Given 07/08/23 1938)  chlordiazePOXIDE (LIBRIUM) capsule 5 mg (5 mg Oral Given 07/08/23 1939)  dextrose 50 % solution 50 mL (50 mLs Intravenous Given 07/08/23 1551)  iohexol (OMNIPAQUE) 350 MG/ML injection 100 mL (100 mLs Intravenous Contrast Given 07/08/23 1610)  dextrose 50 % solution 50 mL (50 mLs Intravenous Given 07/08/23 1719)  dextrose 5 % solution (0 mLs Intravenous Stopped 07/08/23 2028)   ED Course/ Medical Decision Making/ A&P  58 year old here for regular hypoglycemia and chest pain.  Sounds like he takes Trulicity- weekly and insulin twice daily for his diabetes.  Has noted his continuous glucose monitor has been showing blood sugars in the 40s.  He does note he started working out and has been trying to eat better.  Blood sugar on arrival in the 40s here.  He also notes some chest pain rating to his  back.  Nonexertional, nonpleuritic in nature.  He is neurovascular intact.  Workup started from.  Workup started from triage today personally viewed and interpreted:  UDS positive for cocaine Lipase 49 Ethanol negative Troponin flat negative Metabolic panel creatinine 1.4>> baseline 0.9 CBC without leukocytosis CTA dissection study negative  Patient with multiple repeat blood  sugars found to be hypoglycemic despite multiple rounds of D50 as well as a sandwich, juice.  Patient placed on continuous glucose infusion.  Will admit for further workup and management  With regards to his chest pain he is currently asymptomatic.  Will suspicion for acute ACS, PE, dissection.  Currently can get chest pain as his UDS is positive for cocaine.  Could have had coronary artery vasospasm however reassuring troponin.  Discussed with Dr. Allena Katz with medicine who is agreeable to evaluate patient for admission for persistent hypoglycemia.  The patient has been appropriately medically screened and/or stabilized in the ED. I have low suspicion for any other emergent medical condition which would require further screening, evaluation or treatment in the ED or require inpatient management.  Patient is hemodynamically stable and in no acute distress.  Patient able to ambulate in department prior to ED.  Evaluation does not show acute pathology that would require ongoing or additional emergent interventions while in the emergency department or further inpatient treatment.  I have discussed the diagnosis with the patient and answered all questions.  Pain is been managed while in the emergency department and patient has no further complaints prior to discharge.  Patient is comfortable with plan discussed in room and is stable for discharge at this time.  I have discussed strict return precautions for returning to the emergency department.  Patient was encouraged to follow-up with PCP/specialist refer to at discharge.             HEART Score: 3                    Medical Decision Making Amount and/or Complexity of Data Reviewed External Data Reviewed: labs, radiology, ECG and notes. Labs: ordered. Decision-making details documented in ED Course. Radiology: ordered and independent interpretation performed. Decision-making details documented in ED Course. ECG/medicine tests: ordered and independent interpretation performed. Decision-making details documented in ED Course.  Risk OTC drugs. Prescription drug management. Parenteral controlled substances. Decision regarding hospitalization. Diagnosis or treatment significantly limited by social determinants of health.           Final Clinical Impression(s) / ED Diagnoses Final diagnoses:  Hypoglycemia  Precordial pain    Rx / DC Orders ED Discharge Orders     None         Kingslee Mairena A, PA-C 07/08/23 2337    Rexford Maus, DO 07/08/23 2351

## 2023-07-08 NOTE — ED Notes (Signed)
 Pt called X3 to go to a bed. Pt could not be found.

## 2023-07-08 NOTE — Progress Notes (Signed)
 Hypoglycemic Event  CBG: 45  Treatment: {Hypoglycemic Treatment (must also place order and document administration):3049002}  Symptoms: {Hypoglycemic Symptoms:3049003}  Follow-up CBG: Time:*** CBG Result:***  Possible Reasons for Event: {Possible Reasons for Event:3049004}  Comments/MD notified:***    Bradley Howe Bradley Howe

## 2023-07-08 NOTE — ED Triage Notes (Signed)
 Pt c/o hypoglycemia, central chest pain radiating into mid back, and diarrhea starting yesterday.  Pain score 6/10.  Hx of DM.

## 2023-07-08 NOTE — Assessment & Plan Note (Addendum)
 Patient's chest pain is atypical however his risk factors put him in a moderate range for coronary ischemic related chest pain.  Patient's risk factors include gender diabetes mellitus type 2, history of cocaine abuse, tobacco abuse, dyslipidemia. Will order stress test for further evaluation of his ischemic heart disease.

## 2023-07-08 NOTE — Progress Notes (Signed)
 Hypoglycemic Event  CBG: 64  Treatment: 4 oz juice/soda  Symptoms: None  Follow-up CBG: Time:0142 CBG Result:52  Possible Reasons for Event: Unknown  Comments/MD notified:Per hypoglycemia protocol    Bradley Howe

## 2023-07-08 NOTE — ED Notes (Signed)
 Pt eating dinner tray

## 2023-07-08 NOTE — Assessment & Plan Note (Addendum)
 Discussed with patient that I suspect he does not need his 70/30 possibly optional for him to continue Trulicity and or insulin and probably changed to long-acting we will recheck an A1c monitor him overnight and tailor a plan for discharge discontinue 70/30 and possibly considering other p.o. options i.e. Januvia or glimepiride or glipizide. POCT q 2 till AM.

## 2023-07-08 NOTE — Assessment & Plan Note (Signed)
 Avoid contrast and renally dose meds.

## 2023-07-08 NOTE — Assessment & Plan Note (Signed)
 IV PPI

## 2023-07-08 NOTE — Progress Notes (Signed)
New Admission Note:   Arrival Method:  Mental Orientation: Telemetry:  Assessment: Completed Skin: IV: Pain:  Tubes: Safety Measures: Safety Fall Prevention Plan has been discussed.  Admission: Completed 5MW Orientation: Patient has been oriented to the room, unit and staff.  Family:  Orders have been reviewed and implemented. Will continue to monitor the patient. Call light has been placed within reach and bed alarm has been activated.   Charito Bokiagon BSN, RN-BC Phone number: [phone] 

## 2023-07-08 NOTE — Assessment & Plan Note (Signed)
 UDS and ethanol level pending.

## 2023-07-08 NOTE — Assessment & Plan Note (Signed)
 Vitals:   07/08/23 1224 07/08/23 1646  BP: 110/61 127/77  Patient continued on hydralazine, losartan held due to abnormal kidney function.

## 2023-07-08 NOTE — H&P (Signed)
 History and Physical    Patient: Bradley Howe ZOX:[id number] DOB: 03/23/1966 DOA: 07/08/2023 DOS: the patient was seen and examined on 07/08/2023 PCP: Georganna Skeans, MD  Patient coming from: Home Chief complaint: Chief Complaint  Patient presents with   Hypoglycemia   Chest Pain   Back Pain   Diarrhea   HPI:  Bradley Howe is a 58 y.o. male with past medical history  of hypertension, diabetes mellitus type 2, hypoglycemia, dyslipidemia, GERD, history of cocaine abuse presenting with hypoglycemia, multiple complaints of chest pain-midsternal radiating to the back along with diarrhea.  Patient on arrival to the emergency room had a stat CT angio dissection study which was negative.  Patient was given dextrose for his hypoglycemia. Diarrhea since yesterday no blood in stools.  Patient did also have shortness of breath that has resolved. Heart score of 4.  >>ED Course: In emergency room no distress, afebrile, O2 sats 96% on room air.  At bedside patient is pointing to the epigastric region midsternal region for the chest pain states that he has never had chest pain before and it is now gone.  States he is cutting back on smoking and he only drinks occasionally. Vitals:   07/08/23 1224 07/08/23 1242 07/08/23 1646  BP: 110/61  127/77  Pulse: 88  88  Temp: 98.1 F (36.7 C)  98 F (36.7 C)  Resp: 14  16  Height:  5\' 7"  (1.702 m)   Weight:  108.9 kg   SpO2: 94%  96%  TempSrc: Oral    BMI (Calculated):  37.58   EKG sinus rhythm-86 , QTc of 476 septal infarct,  HStroponin of 12 and repeat troponin of 14. ED evaluation  so far shows: BMP showing sodium 133 creatinine 1.5 calcium 1.8 GFR 58 . CBC within normal limits. UDS ordered and pending. Ethanol level ordered and pending. Magnesium level ordered and pending. LFTs ordered and pending. Lipase ordered and pending.  In the emergency room  pt has received the following treatment thus far:  Review of Systems  Cardiovascular:  Positive  for chest pain.  Endo/Heme/Allergies:        Hypoglycemia    Past Medical History:  Diagnosis Date   GERD (gastroesophageal reflux disease)    High cholesterol    Hypertension    Type II diabetes mellitus (HCC)    Past Surgical History:  Procedure Laterality Date   INGUINAL HERNIA REPAIR Left 2013    reports that he has quit smoking. His smoking use included cigarettes. He has a 2.5 pack-year smoking history. He has never used smokeless tobacco. He reports current alcohol use. He reports that he does not use drugs.  Allergies  Allergen Reactions   Metformin And Related Diarrhea    Family History  Problem Relation Age of Onset   Hypertension Mother    Hypertension Father    Diabetes Paternal Grandmother    Heart disease Neg Hx    Colon cancer Neg Hx    Pancreatic cancer Neg Hx    Esophageal cancer Neg Hx    Stomach cancer Neg Hx    Rectal cancer Neg Hx     Prior to Admission medications   Medication Sig Start Date End Date Taking? Authorizing Provider  Continuous Glucose Sensor (FREESTYLE LIBRE 2 SENSOR) MISC Utilize as recommended to monitor blood glucose.Change every 14 days. 07/03/23   Georganna Skeans, MD  Dulaglutide (TRULICITY) 1.5 MG/0.5ML SOAJ Inject 1.5 mg into the skin once a week. 06/29/23 09/27/23  Georganna Skeans, MD  gabapentin (NEURONTIN) 300 MG capsule Take 1 capsule (300 mg total) by mouth 3 (three) times daily. 10/30/22   Rema Fendt, NP  ibuprofen (ADVIL) 600 MG tablet Take 1 tablet (600 mg total) by mouth every 8 (eight) hours as needed. 04/02/21   Claiborne Rigg, NP  insulin NPH-regular Human (HUMULIN 70/30) (70-30) 100 UNIT/ML injection Inject 46 Units into the skin 2 (two) times daily with a meal. 04/05/23   Georganna Skeans, MD  losartan (COZAAR) 50 MG tablet Take 1 tablet (50 mg total) by mouth daily. 11/08/22   Georganna Skeans, MD  omeprazole (PRILOSEC) 20 MG capsule Take 1 capsule by mouth daily. Patient not taking: Reported on 02/23/2023 05/25/11    [provider]  omeprazole (PRILOSEC) 20 MG capsule Take 1 capsule (20 mg total) by mouth daily. Patient taking differently: Take 20 mg by mouth daily as needed. 04/28/22   Georganna Skeans, MD  ondansetron (ZOFRAN-ODT) 4 MG disintegrating tablet Take 1 tablet (4 mg total) by mouth every 8 (eight) hours as needed for nausea or vomiting. Patient not taking: Reported on 02/23/2023 09/27/21   Waldon Merl, PA-C  rosuvastatin (CRESTOR) 20 MG tablet Take 1 tablet by mouth once daily 05/28/23   Georganna Skeans, MD     Vitals:   07/08/23 1224 07/08/23 1242 07/08/23 1646  BP: 110/61  127/77  Pulse: 88  88  Resp: 14  16  Temp: 98.1 F (36.7 C)  98 F (36.7 C)  TempSrc: Oral    SpO2: 94%  96%  Weight:  108.9 kg   Height:  5\' 7"  (1.702 m)    Physical Exam Vitals and nursing note reviewed.  Constitutional:      General: He is not in acute distress. HENT:     Head: Normocephalic and atraumatic.     Right Ear: Hearing normal.     Left Ear: Hearing normal.     Nose: Nose normal. No nasal deformity.     Mouth/Throat:     Lips: Pink.     Tongue: No lesions.     Pharynx: Oropharynx is clear.  Eyes:     General: Lids are normal.     Extraocular Movements: Extraocular movements intact.  Cardiovascular:     Rate and Rhythm: Normal rate and regular rhythm.     Heart sounds: Normal heart sounds.  Pulmonary:     Effort: Pulmonary effort is normal.     Breath sounds: Normal breath sounds.  Abdominal:     General: Bowel sounds are normal. There is no distension.     Palpations: Abdomen is soft. There is no mass.     Tenderness: There is no abdominal tenderness.  Musculoskeletal:     Right lower leg: No edema.     Left lower leg: No edema.  Skin:    General: Skin is warm.  Neurological:     General: No focal deficit present.     Mental Status: He is alert and oriented to person, place, and time.     Cranial Nerves: Cranial nerves 2-12 are intact.  Psychiatric:         Attention and Perception: Attention normal.        Mood and Affect: Mood normal.        Speech: Speech normal.        Behavior: Behavior normal. Behavior is cooperative.      Labs on Admission: I have personally reviewed following labs and imaging studies Results for orders placed or  performed during the hospital encounter of 07/08/23 (from the past 24 hours)  CBG monitoring, ED     Status: Abnormal   Collection Time: 07/08/23 12:39 PM  Result Value Ref Range   Glucose-Capillary 124 (H) 70 - 99 mg/dL  Basic metabolic panel     Status: Abnormal   Collection Time: 07/08/23 12:43 PM  Result Value Ref Range   Sodium 132 (L) 135 - 145 mmol/L   Potassium 3.6 3.5 - 5.1 mmol/L   Chloride 99 98 - 111 mmol/L   CO2 21 (L) 22 - 32 mmol/L   Glucose, Bld 97 70 - 99 mg/dL   BUN 12 6 - 20 mg/dL   Creatinine, Ser 2.54 (H) 0.61 - 1.24 mg/dL   Calcium 8.9 8.9 - 27.0 mg/dL   GFR, Estimated 58 (L) >60 mL/min   Anion gap 12 5 - 15  CBC     Status: None   Collection Time: 07/08/23 12:43 PM  Result Value Ref Range   WBC 9.4 4.0 - 10.5 K/uL   RBC 5.14 4.22 - 5.81 MIL/uL   Hemoglobin 14.6 13.0 - 17.0 g/dL   HCT 62.3 76.2 - 83.1 %   MCV 82.3 80.0 - 100.0 fL   MCH 28.4 26.0 - 34.0 pg   MCHC 34.5 30.0 - 36.0 g/dL   RDW 51.7 61.6 - 07.3 %   Platelets 328 150 - 400 K/uL   nRBC 0.0 0.0 - 0.2 %  Troponin I (High Sensitivity)     Status: None   Collection Time: 07/08/23 12:43 PM  Result Value Ref Range   Troponin I (High Sensitivity) 12 <18 ng/L  I-stat chem 8, ED (not at [facility], [facility] or [facility])     Status: Abnormal   Collection Time: 07/08/23  1:03 PM  Result Value Ref Range   Sodium 133 (L) 135 - 145 mmol/L   Potassium 3.7 3.5 - 5.1 mmol/L   Chloride 99 98 - 111 mmol/L   BUN 13 6 - 20 mg/dL   Creatinine, Ser 7.10 (H) 0.61 - 1.24 mg/dL   Glucose, Bld 95 70 - 99 mg/dL   Calcium, Ion 6.26 (L) 1.15 - 1.40 mmol/L   TCO2 20 (L) 22 - 32 mmol/L   Hemoglobin 15.0 13.0 - 17.0 g/dL   HCT 94.8 54.6 - 27.0 %   CBG monitoring, ED     Status: Abnormal   Collection Time: 07/08/23  3:42 PM  Result Value Ref Range   Glucose-Capillary 44 (LL) 70 - 99 mg/dL   Comment 1 Notify RN    Comment 2 Call MD NNP PA CNM    Comment 3 Document in Chart   Troponin I (High Sensitivity)     Status: None   Collection Time: 07/08/23  3:44 PM  Result Value Ref Range   Troponin I (High Sensitivity) 14 <18 ng/L  POC CBG, ED     Status: None   Collection Time: 07/08/23  4:27 PM  Result Value Ref Range   Glucose-Capillary 94 70 - 99 mg/dL  POC CBG, ED     Status: Abnormal   Collection Time: 07/08/23  4:56 PM  Result Value Ref Range   Glucose-Capillary 51 (L) 70 - 99 mg/dL  CBG monitoring, ED     Status: None   Collection Time: 07/08/23  6:06 PM  Result Value Ref Range   Glucose-Capillary 70 70 - 99 mg/dL  Hepatic function panel     Status: None   Collection Time: 07/08/23  6:42 PM  Result Value Ref Range   Total Protein 6.8 6.5 - 8.1 g/dL   Albumin 3.7 3.5 - 5.0 g/dL   AST 35 15 - 41 U/L   ALT 27 0 - 44 U/L   Alkaline Phosphatase 55 38 - 126 U/L   Total Bilirubin 0.8 0.0 - 1.2 mg/dL   Bilirubin, Direct 0.2 0.0 - 0.2 mg/dL   Indirect Bilirubin 0.6 0.3 - 0.9 mg/dL  Ethanol     Status: None   Collection Time: 07/08/23  6:42 PM  Result Value Ref Range   Alcohol, Ethyl (B) <10 <10 mg/dL  Lipase, blood     Status: None   Collection Time: 07/08/23  6:42 PM  Result Value Ref Range   Lipase 49 11 - 51 U/L   No results found for this or any previous visit (from the past 720 hours). CBC:    Latest Ref Rng & Units 07/08/2023    1:03 PM 07/08/2023   12:43 PM 12/23/2021   10:48 AM  CBC  WBC 4.0 - 10.5 K/uL  9.4  7.2   Hemoglobin 13.0 - 17.0 g/dL 69.6  29.5  28.4   Hematocrit 39.0 - 52.0 % 44.0  42.3  40.9   Platelets 150 - 400 K/uL  328  254    Basic Metabolic Panel: Recent Labs  Lab 07/08/23 1243 07/08/23 1303  NA 132* 133*  K 3.6 3.7  CL 99 99  CO2 21*  --   GLUCOSE 97 95  BUN 12 13  CREATININE  1.40* 1.50*  CALCIUM 8.9  --    Creatinine: Lab Results  Component Value Date   CREATININE 1.50 (H) 07/08/2023   CREATININE 1.40 (H) 07/08/2023   CREATININE 0.95 12/23/2021   Liver Function Tests:    Latest Ref Rng & Units 07/08/2023    6:42 PM 12/23/2021   10:48 AM 07/25/2021    9:23 AM  Hepatic Function  Total Protein 6.5 - 8.1 g/dL 6.8  7.0  6.8   Albumin 3.5 - 5.0 g/dL 3.7  4.7  4.3   AST 15 - 41 U/L 35  35  32   ALT 0 - 44 U/L 27  34  38   Alk Phosphatase 38 - 126 U/L 55  69  63   Total Bilirubin 0.0 - 1.2 mg/dL 0.8  0.6  0.5   Bilirubin, Direct 0.0 - 0.2 mg/dL 0.2      Coagulation Profile: No results for input(s): "INR", "PROTIME" in the last 168 hours. Cardiac Enzymes: No results for input(s): "CKTOTAL", "CKMB", "CKMBINDEX", "TROPONINI" in the last 168 hours. BNP (last 3 results) No results for input(s): "PROBNP" in the last 8760 hours. HbA1C: No results for input(s): "HGBA1C" in the last 72 hours. Lipid Profile: No results for input(s): "CHOL", "HDL", "LDLCALC", "TRIG", "CHOLHDL", "LDLDIRECT" in the last 72 hours.  Radiological Exams on Admission: CT Angio Chest/Abd/Pel for Dissection W and/or W/WO Result Date: 07/08/2023 CLINICAL DATA:  Aortic syndrome suspected. Chest pain. Hyperglycemia. EXAM: CT ANGIOGRAPHY CHEST, ABDOMEN AND PELVIS TECHNIQUE: Non-contrast CT of the chest was initially obtained. Multidetector CT imaging through the chest, abdomen and pelvis was performed using the standard protocol during bolus administration of intravenous contrast. Multiplanar reconstructed images and MIPs were obtained and reviewed to evaluate the vascular anatomy. RADIATION DOSE REDUCTION: This exam was performed according to the departmental dose-optimization program which includes automated exposure control, adjustment of the mA and/or kV according to patient size and/or use of iterative reconstruction  technique. CONTRAST:  OMNIPAQUE IOHEXOL 350 MG/ML SOLN COMPARISON:  None  Available. FINDINGS: CTA CHEST FINDINGS Cardiovascular: Non IV contrast imaging demonstrates no intramural hematoma within the thoracic aorta. Contrast enhanced imaging demonstrates no acute findings of the aorta or great vessels. No pulmonary embolism. No pericardial fluid. No mediastinal hematoma. Mediastinum/Nodes: No axillary or supraclavicular adenopathy. No mediastinal or hilar adenopathy. No pericardial fluid. Esophagus normal. Lungs/Pleura: No pulmonary infarction. No pneumonia. No pleural fluid. No pneumothorax Musculoskeletal: No fracture. Review of the MIP images confirms the above findings. CTA ABDOMEN AND PELVIS FINDINGS VASCULAR Aorta: Normal caliber aorta without aneurysm, dissection, vasculitis or significant stenosis. Celiac: Patent without evidence of aneurysm, dissection, vasculitis or significant stenosis. SMA: Patent without evidence of aneurysm, dissection, vasculitis or significant stenosis. Renals: Both renal arteries are patent without evidence of aneurysm, dissection, vasculitis, fibromuscular dysplasia or significant stenosis. IMA: Patent without evidence of aneurysm, dissection, vasculitis or significant stenosis. Inflow: Patent without evidence of aneurysm, dissection, vasculitis or significant stenosis. Veins: No obvious venous abnormality within the limitations of this arterial phase study. Review of the MIP images confirms the above findings. NON-VASCULAR Hepatobiliary: No focal hepatic lesion. Normal gallbladder. No biliary duct dilatation. Common bile duct is normal. Pancreas: Pancreas is normal. No ductal dilatation. No pancreatic inflammation. Spleen: Normal spleen Adrenals/urinary tract: Adrenal glands and kidneys are normal. The ureters and bladder normal. Stomach/Bowel: Stomach, small bowel, appendix, and cecum are normal. The colon and rectosigmoid colon are normal. Vascular/Lymphatic: Abdominal aorta is normal caliber. No periportal or retroperitoneal adenopathy. No pelvic  adenopathy. Reproductive: Prostate unremarkable Other: No free fluid. Musculoskeletal: No aggressive osseous lesion. Review of the MIP images confirms the above findings. IMPRESSION: CHEST: 1. No acute aortic syndrome. 2. No pulmonary embolism. 3. No acute pulmonary findings. PELVIS: 1. No acute aortic syndrome. 2. No acute findings in the abdomen pelvis. Electronically Signed   By: Genevive Bi M.D.   On: 07/08/2023 16:38    Data Reviewed: Relevant notes from primary care and specialist visits, past discharge summaries as available in EHR, including Care Everywhere. Prior diagnostic testing as pertinent to current admission diagnoses, Updated medications and problem lists for reconciliation ED course, including vitals, labs, imaging, treatment and response to treatment,Triage notes, nursing and pharmacy notes and ED provider's notes Notable results as noted in HPI.Discussed case with EDMD/ ED APP/ or Specialty MD on call and as needed.  >>Assessment and Plan: * Hypoglycemia due to type 2 diabetes mellitus (HCC) Discussed with patient that I suspect he does not need his 70/30 possibly optional for him to continue Trulicity and or insulin and probably changed to long-acting we will recheck an A1c monitor him overnight and tailor a plan for discharge discontinue 70/30 and possibly considering other p.o. options i.e. Januvia or glimepiride or glipizide. POCT q 2 till AM.   History of cocaine abuse (HCC) UDS and ethanol level pending.  Essential (primary) hypertension Vitals:   07/08/23 1224 07/08/23 1646  BP: 110/61 127/77  Patient continued on hydralazine, losartan held due to abnormal kidney function.   GERD (gastroesophageal reflux disease) IV PPI.   Renal insufficiency Avoid contrast and renally dose meds.   Chest pain Patient's chest pain is atypical however his risk factors put him in a moderate range for coronary ischemic related chest pain.  Patient's risk factors include  gender diabetes mellitus type 2, history of cocaine abuse, tobacco abuse, dyslipidemia. Will order stress test for further evaluation of his ischemic heart disease.    DVT prophylaxis:  Heparin  Consults:  None  Advance Care Planning:    Code Status: Full Code   Family Communication:  Wife  Disposition Plan:  Home  Severity of Illness: The appropriate patient status for this patient is OBSERVATION. Observation status is judged to be reasonable and necessary in order to provide the required intensity of service to ensure the patient's safety. The patient's presenting symptoms, physical exam findings, and initial radiographic and laboratory data in the context of their medical condition is felt to place them at decreased risk for further clinical deterioration. Furthermore, it is anticipated that the patient will be medically stable for discharge from the hospital within 2 midnights of admission.   Author: Gertha Calkin, MD 07/08/2023 7:40 PM  For on call review [email or web address].   Unresulted Labs (From admission, onward)     Start     Ordered   07/09/23 0500  Comprehensive metabolic panel  Tomorrow morning,   R        07/08/23 1845   07/09/23 0500  CBC  Tomorrow morning,   R        07/08/23 1845   07/08/23 1932  Hemoglobin A1c  Add-on,   AD        07/08/23 1931   07/08/23 1915  HIV Antibody (routine testing w rflx)  Once,   R        07/08/23 1915   07/08/23 1841  Vitamin B12  Add-on,   AD        07/08/23 1840   07/08/23 1827  Rapid urine drug screen (hospital performed)  ONCE - STAT,   STAT        07/08/23 1826           Medications  pantoprazole (PROTONIX) injection 40 mg (has no administration in time range)  thiamine (VITAMIN B1) injection 100 mg (has no administration in time range)  heparin injection 5,000 Units (has no administration in time range)  sodium chloride flush (NS) 0.9 % injection 3 mL (has no administration in time range)  acetaminophen (TYLENOL) tablet  650 mg (has no administration in time range)    Or  acetaminophen (TYLENOL) suppository 650 mg (has no administration in time range)  morphine (PF) 2 MG/ML injection 2 mg (has no administration in time range)  hydrALAZINE (APRESOLINE) injection 10 mg (has no administration in time range)  nicotine (NICODERM CQ - dosed in mg/24 hr) patch 7 mg (has no administration in time range)  dextrose 5 % and 0.45 % NaCl infusion (has no administration in time range)  LORazepam (ATIVAN) tablet 1-4 mg (has no administration in time range)    Or  LORazepam (ATIVAN) injection 1-4 mg (has no administration in time range)  thiamine (VITAMIN B1) tablet 100 mg (has no administration in time range)    Or  thiamine (VITAMIN B1) injection 100 mg (has no administration in time range)  folic acid (FOLVITE) tablet 1 mg (has no administration in time range)  multivitamin with minerals tablet 1 tablet (has no administration in time range)  chlordiazePOXIDE (LIBRIUM) capsule 5 mg (has no administration in time range)  dextrose 50 % solution 50 mL (50 mLs Intravenous Given 07/08/23 1551)  iohexol (OMNIPAQUE) 350 MG/ML injection 100 mL (100 mLs Intravenous Contrast Given 07/08/23 1610)  dextrose 50 % solution 50 mL (50 mLs Intravenous Given 07/08/23 1719)  dextrose 5 % solution ( Intravenous New Bag/Given 07/08/23 1827)   Orders Placed This Encounter  Procedures   Critical Care  CT Angio Chest/Abd/Pel for Dissection W and/or W/WO   NM Myocar Multi W/Spect W/Wall Motion / EF   Basic metabolic panel   CBC   Hepatic function panel   Ethanol   Lipase, blood   Rapid urine drug screen (hospital performed)   Vitamin B12   Comprehensive metabolic panel   CBC   HIV Antibody (routine testing w rflx)   Hemoglobin A1c   Diet Heart Room service appropriate? Yes; Fluid consistency: Thin   Document Height and Actual Weight   Fluid/PO Challenge   Patient may eat/drink   Maintain IV access   Vital signs   Notify physician  (specify)   Mobility Protocol: No Restrictions RN to initiate protocols based on patient's level of care   Refer to Sidebar Report Refer to ICU, Med-Surg, Progressive, and Step-Down Mobility Protocol Sidebars   Initiate Adult Central Line Maintenance and Catheter Protocol for patients with central line (CVC, PICC, Port, Hemodialysis, Trialysis)   Daily weights   Intake and Output   Do not place and if present remove PureWick   Initiate Oral Care Protocol   Initiate Carrier Fluid Protocol   RN may order General Admission PRN Orders utilizing "General Admission PRN medications" (through manage orders) for the following patient needs: allergy symptoms (Claritin), cold sores (Carmex), cough (Robitussin DM), eye irritation (Liquifilm Tears), hemorrhoids (Tucks), indigestion (Maalox), minor skin irritation (Hydrocortisone Cream), muscle pain Romeo Apple Gay), nose irritation (saline nasal spray) and sore throat (Chloraseptic spray).   SCDs   Cardiac Monitoring Continuous x 48 hours Indications for use: Other; Other indications for use: chest pain   Clinical institute withdrawal assessment   Vital signs every 6 hours X 48 hours, then per unit protocol   Refer to Sidebar Report for reference: ETOH Withdrawal Guidelines   Clinical Institute Withdrawal Assessent (CIWA)   If Ativan given, reassess Clinical Institute Withdrawal Assessment (CIWA) with blood pressure and pulse rate within 1 hour of Ativan administration   Notify Pharmacy to change IV Ativan to PO if tolerating POs well.   Notify physician (specify)   Full code   Consult to hospitalist   Consult to Transition of Care Team   Inpatient consult to Cardiology Consult Timeframe: ROUTINE - requires response within 24 hours; Reason for Consult? chest pain / h/o cocaine/ DM II / HTN   Pulse oximetry check with vital signs   Oxygen therapy Mode or (Route): Nasal cannula; Liters Per Minute: 2; Keep O2 saturation between: greater than 92 %   CBG monitoring,  ED   I-stat chem 8, ED (not at [facility], [facility] or [facility])   CBG monitoring, ED   POC CBG, ED   POC CBG, ED   CBG monitoring, ED   ED EKG   Saline lock IV   Place in observation (patient's expected length of stay will be less than 2 midnights)   Aspiration precautions   Fall precautions

## 2023-07-09 DIAGNOSIS — E119 Type 2 diabetes mellitus without complications: Secondary | ICD-10-CM

## 2023-07-09 DIAGNOSIS — F1410 Cocaine abuse, uncomplicated: Secondary | ICD-10-CM | POA: Diagnosis present

## 2023-07-09 DIAGNOSIS — Z7985 Long-term (current) use of injectable non-insulin antidiabetic drugs: Secondary | ICD-10-CM | POA: Diagnosis not present

## 2023-07-09 DIAGNOSIS — E871 Hypo-osmolality and hyponatremia: Secondary | ICD-10-CM | POA: Diagnosis present

## 2023-07-09 DIAGNOSIS — N179 Acute kidney failure, unspecified: Secondary | ICD-10-CM

## 2023-07-09 DIAGNOSIS — F17210 Nicotine dependence, cigarettes, uncomplicated: Secondary | ICD-10-CM | POA: Diagnosis present

## 2023-07-09 DIAGNOSIS — F1910 Other psychoactive substance abuse, uncomplicated: Secondary | ICD-10-CM

## 2023-07-09 DIAGNOSIS — R079 Chest pain, unspecified: Secondary | ICD-10-CM

## 2023-07-09 DIAGNOSIS — Z79899 Other long term (current) drug therapy: Secondary | ICD-10-CM | POA: Diagnosis not present

## 2023-07-09 DIAGNOSIS — R197 Diarrhea, unspecified: Secondary | ICD-10-CM | POA: Diagnosis present

## 2023-07-09 DIAGNOSIS — Z888 Allergy status to other drugs, medicaments and biological substances status: Secondary | ICD-10-CM | POA: Diagnosis not present

## 2023-07-09 DIAGNOSIS — E66812 Obesity, class 2: Secondary | ICD-10-CM | POA: Diagnosis present

## 2023-07-09 DIAGNOSIS — I252 Old myocardial infarction: Secondary | ICD-10-CM | POA: Diagnosis not present

## 2023-07-09 DIAGNOSIS — I10 Essential (primary) hypertension: Secondary | ICD-10-CM

## 2023-07-09 DIAGNOSIS — Z8249 Family history of ischemic heart disease and other diseases of the circulatory system: Secondary | ICD-10-CM | POA: Diagnosis not present

## 2023-07-09 DIAGNOSIS — R072 Precordial pain: Secondary | ICD-10-CM | POA: Diagnosis present

## 2023-07-09 DIAGNOSIS — E11649 Type 2 diabetes mellitus with hypoglycemia without coma: Secondary | ICD-10-CM | POA: Diagnosis present

## 2023-07-09 DIAGNOSIS — Z833 Family history of diabetes mellitus: Secondary | ICD-10-CM | POA: Diagnosis not present

## 2023-07-09 DIAGNOSIS — K219 Gastro-esophageal reflux disease without esophagitis: Secondary | ICD-10-CM | POA: Diagnosis present

## 2023-07-09 DIAGNOSIS — Z5986 Financial insecurity: Secondary | ICD-10-CM | POA: Diagnosis not present

## 2023-07-09 DIAGNOSIS — Z6837 Body mass index (BMI) 37.0-37.9, adult: Secondary | ICD-10-CM | POA: Diagnosis not present

## 2023-07-09 DIAGNOSIS — E7800 Pure hypercholesterolemia, unspecified: Secondary | ICD-10-CM

## 2023-07-09 LAB — COMPREHENSIVE METABOLIC PANEL
ALT: 25 U/L (ref 0–44)
AST: 29 U/L (ref 15–41)
Albumin: 3.8 g/dL (ref 3.5–5.0)
Alkaline Phosphatase: 56 U/L (ref 38–126)
Anion gap: 9 (ref 5–15)
BUN: 18 mg/dL (ref 6–20)
CO2: 22 mmol/L (ref 22–32)
Calcium: 9.2 mg/dL (ref 8.9–10.3)
Chloride: 100 mmol/L (ref 98–111)
Creatinine, Ser: 1.53 mg/dL — ABNORMAL HIGH (ref 0.61–1.24)
GFR, Estimated: 52 mL/min — ABNORMAL LOW (ref >60)
Glucose, Bld: 54 mg/dL — ABNORMAL LOW (ref 70–99)
Potassium: 3.6 mmol/L (ref 3.5–5.1)
Sodium: 131 mmol/L — ABNORMAL LOW (ref 135–145)
Total Bilirubin: 0.8 mg/dL (ref 0.0–1.2)
Total Protein: 6.9 g/dL (ref 6.5–8.1)

## 2023-07-09 LAB — CBC
HCT: 40.7 % (ref 39.0–52.0)
Hemoglobin: 14 g/dL (ref 13.0–17.0)
MCH: 28.4 pg (ref 26.0–34.0)
MCHC: 34.4 g/dL (ref 30.0–36.0)
MCV: 82.6 fL (ref 80.0–100.0)
Platelets: 313 10*3/uL (ref 150–400)
RBC: 4.93 MIL/uL (ref 4.22–5.81)
RDW: 13.4 % (ref 11.5–15.5)
WBC: 10.6 10*3/uL — ABNORMAL HIGH (ref 4.0–10.5)
nRBC: 0 % (ref 0.0–0.2)

## 2023-07-09 LAB — HEMOGLOBIN A1C
Hgb A1c MFr Bld: 7.3 % — ABNORMAL HIGH (ref 4.8–5.6)
Mean Plasma Glucose: 162.81 mg/dL

## 2023-07-09 LAB — GLUCOSE, CAPILLARY
Glucose-Capillary: 110 mg/dL — ABNORMAL HIGH (ref 70–99)
Glucose-Capillary: 124 mg/dL — ABNORMAL HIGH (ref 70–99)
Glucose-Capillary: 134 mg/dL — ABNORMAL HIGH (ref 70–99)
Glucose-Capillary: 153 mg/dL — ABNORMAL HIGH (ref 70–99)
Glucose-Capillary: 224 mg/dL — ABNORMAL HIGH (ref 70–99)
Glucose-Capillary: 229 mg/dL — ABNORMAL HIGH (ref 70–99)
Glucose-Capillary: 51 mg/dL — ABNORMAL LOW (ref 70–99)
Glucose-Capillary: 52 mg/dL — ABNORMAL LOW (ref 70–99)
Glucose-Capillary: 53 mg/dL — ABNORMAL LOW (ref 70–99)
Glucose-Capillary: 64 mg/dL — ABNORMAL LOW (ref 70–99)
Glucose-Capillary: 66 mg/dL — ABNORMAL LOW (ref 70–99)

## 2023-07-09 LAB — VITAMIN B12: Vitamin B-12: 302 pg/mL (ref 180–914)

## 2023-07-09 LAB — HIV ANTIBODY (ROUTINE TESTING W REFLEX): HIV Screen 4th Generation wRfx: NONREACTIVE

## 2023-07-09 MED ORDER — IVABRADINE HCL 5 MG PO TABS
7.5000 mg | ORAL_TABLET | ORAL | Status: DC
  Filled 2023-07-09: qty 1

## 2023-07-09 MED ORDER — DEXTROSE-SODIUM CHLORIDE 10-0.45 % IV SOLN
INTRAVENOUS | Status: DC
  Filled 2023-07-09: qty 1000

## 2023-07-09 MED ORDER — DEXTROSE 50 % IV SOLN
25.0000 g | INTRAVENOUS | Status: AC
Start: 2023-07-09 — End: 2023-07-09

## 2023-07-09 MED ORDER — DEXTROSE 50 % IV SOLN
INTRAVENOUS | Status: AC
  Filled 2023-07-09: qty 50

## 2023-07-09 MED ORDER — DEXTROSE 50 % IV SOLN
25.0000 g | INTRAVENOUS | Status: AC
  Administered 2023-07-09: 25 g via INTRAVENOUS

## 2023-07-09 MED ORDER — DEXTROSE 50 % IV SOLN
INTRAVENOUS | Status: AC
Start: 2023-07-09 — End: 2023-07-09
  Filled 2023-07-09: qty 50

## 2023-07-09 MED ORDER — METOPROLOL TARTRATE 50 MG PO TABS
50.0000 mg | ORAL_TABLET | Freq: Once | ORAL | Status: AC
  Administered 2023-07-09: 50 mg via ORAL
  Filled 2023-07-09: qty 1

## 2023-07-09 MED ORDER — DEXTROSE-SODIUM CHLORIDE 5-0.9 % IV SOLN: INTRAVENOUS | Status: DC

## 2023-07-09 MED ORDER — DEXTROSE 10 % IV SOLN: INTRAVENOUS | Status: DC

## 2023-07-09 MED ORDER — IVABRADINE HCL 5 MG PO TABS: 7.5000 mg | ORAL_TABLET | ORAL | Status: DC

## 2023-07-09 MED ORDER — IVABRADINE HCL 5 MG PO TABS
7.5000 mg | ORAL_TABLET | ORAL | Status: AC
  Administered 2023-07-09: 7.5 mg via ORAL
  Filled 2023-07-09: qty 1

## 2023-07-09 MED ORDER — POTASSIUM CHLORIDE CRYS ER 20 MEQ PO TBCR
40.0000 meq | EXTENDED_RELEASE_TABLET | Freq: Once | ORAL | Status: AC
  Administered 2023-07-09: 40 meq via ORAL
  Filled 2023-07-09: qty 2

## 2023-07-09 MED ORDER — DEXTROSE 50 % IV SOLN
12.5000 g | INTRAVENOUS | Status: AC
Start: 2023-07-09 — End: 2023-07-09
  Administered 2023-07-09: 12.5 g via INTRAVENOUS
  Filled 2023-07-09: qty 50

## 2023-07-09 MED ORDER — DEXTROSE 50 % IV SOLN
50.0000 mL | Freq: Once | INTRAVENOUS | Status: AC
  Administered 2023-07-09: 50 mL via INTRAVENOUS

## 2023-07-09 NOTE — Progress Notes (Signed)
 Hypoglycemic Event  CBG: 51  Treatment: D50 50 mL (25 gm)  Symptoms: None  Follow-up CBG: Time:0326 CBG Result:66  Possible Reasons for Event: Unknown  Comments/MD notified:Crosley,MD. Order received.    Bradley Howe [employer]

## 2023-07-09 NOTE — Progress Notes (Signed)
 PROGRESS NOTE    Bradley Howe  ZOX:[id number] DOB: Jul 29, 1965 DOA: 07/08/2023 PCP: Georganna Skeans, MD   Brief Narrative:  58 y.o. male with past medical history  of hypertension, diabetes mellitus type 2, hypoglycemia, dyslipidemia, GERD, history of cocaine abuse presented with hypoglycemia, chest pain and diarrhea.  On presentation, CT angio dissection study was negative.  High-sensitivity troponins were 12 and 14.  UDS was positive for cocaine.  Creatinine was 1.5.  He had episodes of hypoglycemia requiring IV dextrose.  Assessment & Plan:   Hypoglycemia in a patient with diabetes mellitus type 2 -Continues to have episodes of hypoglycemia despite few doses of 50% dextrose.  Will start D10.  Continue CBG monitoring.  70/30 insulin on hold.  A1c 7.3  Chest pain -Possibly from cocaine abuse.  Troponins negative.  EKG nonischemic.  I will consult cardiology.  Stress test ordered by admitting hospitalist  Acute kidney injury -Possibly from dehydration.  Creatinine 1.53 today.  IV fluids as above.  Monitor  Hyponatremia -Encourage oral intake.  Repeat a.m. labs  Obesity class II -Outpatient follow-up  Cocaine abuse -Counseled regarding cessation.  TOC consult  GERD -Continue IV Protonix  Essential hypertension -Blood pressure currently on the lower side.  Hydralazine and losartan on hold  DVT prophylaxis: Subcutaneous heparin Code Status: Full Family Communication: None at bedside Disposition Plan: Status is: Observation The patient will require care spanning > 2 midnights and should be moved to inpatient because: Of severity of illness and persistent hypoglycemia    Consultants: Consult cardiology  Procedures: None  Antimicrobials: None   Subjective: Patient seen and examined at bedside.  Does not feel well and continues to have intermittent chest pain but improving.  No fever, vomiting, worsening abdominal pain reported.  Objective: Vitals:   07/08/23 2159  07/08/23 2349 07/09/23 0329 07/09/23 0329  BP: (!) 114/58 122/67 103/65 103/65  Pulse: 89 85 90 90  Resp: 19 17 17 17   Temp: 97.8 F (36.6 C) 98 F (36.7 C) 97.7 F (36.5 C) 97.7 F (36.5 C)  TempSrc: Oral Oral Oral Oral  SpO2: 96% 95% 95% 95%  Weight:      Height:        Intake/Output Summary (Last 24 hours) at 07/09/2023 0754 Last data filed at 07/09/2023 8119 Gross per 24 hour  Intake 1123.59 ml  Output 650 ml  Net 473.59 ml   Filed Weights   07/08/23 1242  Weight: 108.9 kg    Examination:  General exam: Appears calm and comfortable.  On room air. Respiratory system: Bilateral decreased breath sounds at bases Cardiovascular system: S1 & S2 heard, Rate controlled Gastrointestinal system: Abdomen is nondistended, soft and nontender. Normal bowel sounds heard. Extremities: No cyanosis, clubbing, edema  Central nervous system: Alert and oriented. No focal neurological deficits. Moving extremities Skin: No rashes, lesions or ulcers Psychiatry: Flat affect.  Not agitated.  Data Reviewed: I have personally reviewed following labs and imaging studies  CBC: Recent Labs  Lab 07/08/23 1243 07/08/23 1303 07/09/23 0341  WBC 9.4  --  10.6*  HGB 14.6 15.0 14.0  HCT 42.3 44.0 40.7  MCV 82.3  --  82.6  PLT 328  --  313   Basic Metabolic Panel: Recent Labs  Lab 07/08/23 1243 07/08/23 1303 07/09/23 0341  NA 132* 133* 131*  K 3.6 3.7 3.6  CL 99 99 100  CO2 21*  --  22  GLUCOSE 97 95 54*  BUN 12 13 18   CREATININE 1.40* 1.50* 1.53*  CALCIUM 8.9  --  9.2   GFR: Estimated Creatinine Clearance: 61.2 mL/min (A) (by C-G formula based on SCr of 1.53 mg/dL (H)). Liver Function Tests: Recent Labs  Lab 07/08/23 1842 07/09/23 0341  AST 35 29  ALT 27 25  ALKPHOS 55 56  BILITOT 0.8 0.8  PROT 6.8 6.9  ALBUMIN 3.7 3.8   Recent Labs  Lab 07/08/23 1842  LIPASE 49   No results for input(s): "AMMONIA" in the last 168 hours. Coagulation Profile: No results for  input(s): "INR", "PROTIME" in the last 168 hours. Cardiac Enzymes: No results for input(s): "CKTOTAL", "CKMB", "CKMBINDEX", "TROPONINI" in the last 168 hours. BNP (last 3 results) No results for input(s): "PROBNP" in the last 8760 hours. HbA1C: Recent Labs    07/09/23 0341  HGBA1C 7.3*   CBG: Recent Labs  Lab 07/09/23 0233 07/09/23 0326 07/09/23 0418 07/09/23 0638 07/09/23 0729  GLUCAP 51* 66* 153* 53* 134*   Lipid Profile: No results for input(s): "CHOL", "HDL", "LDLCALC", "TRIG", "CHOLHDL", "LDLDIRECT" in the last 72 hours. Thyroid Function Tests: No results for input(s): "TSH", "T4TOTAL", "FREET4", "T3FREE", "THYROIDAB" in the last 72 hours. Anemia Panel: Recent Labs    07/09/23 0341  VITAMINB12 302   Sepsis Labs: No results for input(s): "PROCALCITON", "LATICACIDVEN" in the last 168 hours.  No results found for this or any previous visit (from the past 240 hours).       Radiology Studies: CT Angio Chest/Abd/Pel for Dissection W and/or W/WO Result Date: 07/08/2023 CLINICAL DATA:  Aortic syndrome suspected. Chest pain. Hyperglycemia. EXAM: CT ANGIOGRAPHY CHEST, ABDOMEN AND PELVIS TECHNIQUE: Non-contrast CT of the chest was initially obtained. Multidetector CT imaging through the chest, abdomen and pelvis was performed using the standard protocol during bolus administration of intravenous contrast. Multiplanar reconstructed images and MIPs were obtained and reviewed to evaluate the vascular anatomy. RADIATION DOSE REDUCTION: This exam was performed according to the departmental dose-optimization program which includes automated exposure control, adjustment of the mA and/or kV according to patient size and/or use of iterative reconstruction technique. CONTRAST:  OMNIPAQUE IOHEXOL 350 MG/ML SOLN COMPARISON:  None Available. FINDINGS: CTA CHEST FINDINGS Cardiovascular: Non IV contrast imaging demonstrates no intramural hematoma within the thoracic aorta. Contrast  enhanced imaging demonstrates no acute findings of the aorta or great vessels. No pulmonary embolism. No pericardial fluid. No mediastinal hematoma. Mediastinum/Nodes: No axillary or supraclavicular adenopathy. No mediastinal or hilar adenopathy. No pericardial fluid. Esophagus normal. Lungs/Pleura: No pulmonary infarction. No pneumonia. No pleural fluid. No pneumothorax Musculoskeletal: No fracture. Review of the MIP images confirms the above findings. CTA ABDOMEN AND PELVIS FINDINGS VASCULAR Aorta: Normal caliber aorta without aneurysm, dissection, vasculitis or significant stenosis. Celiac: Patent without evidence of aneurysm, dissection, vasculitis or significant stenosis. SMA: Patent without evidence of aneurysm, dissection, vasculitis or significant stenosis. Renals: Both renal arteries are patent without evidence of aneurysm, dissection, vasculitis, fibromuscular dysplasia or significant stenosis. IMA: Patent without evidence of aneurysm, dissection, vasculitis or significant stenosis. Inflow: Patent without evidence of aneurysm, dissection, vasculitis or significant stenosis. Veins: No obvious venous abnormality within the limitations of this arterial phase study. Review of the MIP images confirms the above findings. NON-VASCULAR Hepatobiliary: No focal hepatic lesion. Normal gallbladder. No biliary duct dilatation. Common bile duct is normal. Pancreas: Pancreas is normal. No ductal dilatation. No pancreatic inflammation. Spleen: Normal spleen Adrenals/urinary tract: Adrenal glands and kidneys are normal. The ureters and bladder normal. Stomach/Bowel: Stomach, small bowel, appendix, and cecum are normal. The colon and rectosigmoid colon  are normal. Vascular/Lymphatic: Abdominal aorta is normal caliber. No periportal or retroperitoneal adenopathy. No pelvic adenopathy. Reproductive: Prostate unremarkable Other: No free fluid. Musculoskeletal: No aggressive osseous lesion. Review of the MIP images confirms the  above findings. IMPRESSION: CHEST: 1. No acute aortic syndrome. 2. No pulmonary embolism. 3. No acute pulmonary findings. PELVIS: 1. No acute aortic syndrome. 2. No acute findings in the abdomen pelvis. Electronically Signed   By: Genevive Bi M.D.   On: 07/08/2023 16:38        Scheduled Meds:  chlordiazePOXIDE  5 mg Oral QID   folic acid  1 mg Oral Daily   heparin  5,000 Units Subcutaneous Q12H   multivitamin with minerals  1 tablet Oral Daily   nicotine  7 mg Transdermal Daily   pantoprazole (PROTONIX) IV  40 mg Intravenous Q12H   sodium chloride flush  3 mL Intravenous Q12H   thiamine  100 mg Oral Daily   Or   thiamine  100 mg Intravenous Daily   Continuous Infusions:  dextrose 100 mL/hr at 07/09/23 0981          Glade Lloyd, MD [facility] 07/09/2023, 7:54 AM

## 2023-07-09 NOTE — Progress Notes (Signed)
 Hypoglycemic Event  CBG: 59  Treatment: 4 oz juice/soda  Symptoms: None  Follow-up CBG: Time:22:49 CBG Result:94  Possible Reasons for Event: Unknown  Comments/MD notified:Per hypoglycemia protocol    Bradley Howe

## 2023-07-09 NOTE — Progress Notes (Signed)
 Hypoglycemic Event  CBG: 53  Treatment: D50 50 mL (25 gm)  Symptoms: None  Follow-up CBG: Time:0729 CBG Result:134  Possible Reasons for Event: Unknown  Comments/MD notified:Per hypoglycemia protocol    Bradley Howe

## 2023-07-09 NOTE — Progress Notes (Signed)
 Pt received 7.5 mg corlanor with no drop in HR - still 89 bpm. Per pharmD, corlanor has been very difficult to obtain. Will discontinue second dose of 7.5 mg. Discussed with pharmD, given that cocaine use was Friday night and 5 half-lives is less than 12 hrs, we will give 50 mg lopressor OTO for CT coronary.   Roe Rutherford Ameria Sanjurjo, PA-C 07/09/2023, 2:02 PM [phone]

## 2023-07-09 NOTE — Progress Notes (Signed)
 Patient had 4 episodes of hypoglycemia since  21:45. Crosley,MD made aware. Order received. Will continue to monitor.

## 2023-07-09 NOTE — Progress Notes (Signed)
 Hypoglycemic Event  CBG: 52  Treatment: D50 25 mL (12.5 gm)  Symptoms: None  Follow-up CBG: Time:0233 CBG Result:51  Possible Reasons for Event: Unknown  Comments/MD notified:Per hypoglycemia protocol    Rovena Hearld Joselita

## 2023-07-09 NOTE — Progress Notes (Signed)
 Hypoglycemic Event  CBG: 66  Treatment: D50 % solution12.5 g  Symptoms: None  Follow-up CBG: Time:0418 CBG Result:153  Possible Reasons for Event: Unknown  Comments/MD notified:per hypoglycemia protocol    Bradley Howe Bradley Howe

## 2023-07-09 NOTE — Consult Note (Signed)
 Cardiology Consultation   Patient ID: Bradley Howe MRN: [id number]; DOB: 04/19/1966  Admit date: 07/08/2023 Date of Consult: 07/09/2023  PCP:  Bradley Skeans, MD   [facility] Providers Cardiologist:  Bradley Magic, MD      Patient Profile:   Bradley Howe is a 58 y.o. male with a hx of hypertension, hyperlipidemia, GERD, DM with hypoglycemia, and cocaine abuse who is being seen 07/09/2023 for the evaluation of chest pain at the request of Dr. Hanley Howe.  History of Present Illness:   Mr. Bradley Howe has no prior cardiac history. He has a history of treated hypertension with 50 mg of losartan and treated hyper lipidemia with 20 mg Crestor.  He has no history of CAD, MI, or prior PCI.  He denies history of stroke.  He presents today for evaluation of chest pain.  He initially presented to the ER for hypoglycemia x 3-4 days.  He also reports chest pain that started yesterday when sitting and radiated to his mid back.  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 He also has a history of GERD, managed with omeprazole. He states this chest pain was rated as a 5-10, constant for 2 hrs, with no radiation or associated symptoms - specifically denies nausea, vomiting, and diaphoresis. Pain subsided spontaneously.   He denies a family history of heart disease. He smokes twice per week when drinking alcohol, usually 2 beers. He has not used cocaine in a very long time, but used Friday evening. No chest pain right after cocaine use. No further chest pain since admission. He does not recall if CP was associated with hypoglycemia.   Past Medical History:  Diagnosis Date   GERD (gastroesophageal reflux disease)    High cholesterol    Hypertension    Type II diabetes mellitus (HCC)     Past Surgical History:  Procedure Laterality Date   INGUINAL HERNIA REPAIR Left 2013     Home Medications:  Prior to Admission medications    Medication Sig Start Date End Date Taking? Authorizing Provider  Continuous Glucose Sensor (FREESTYLE LIBRE 2 SENSOR) MISC Utilize as recommended to monitor blood glucose.Change every 14 days. 07/03/23   Bradley Skeans, MD  Dulaglutide (TRULICITY) 1.5 MG/0.5ML SOAJ Inject 1.5 mg into the skin once a week. 06/29/23 09/27/23  Bradley Skeans, MD  gabapentin (NEURONTIN) 300 MG capsule Take 1 capsule (300 mg total) by mouth 3 (three) times daily. 10/30/22   Rema Fendt, NP  ibuprofen (ADVIL) 600 MG tablet Take 1 tablet (600 mg total) by mouth every 8 (eight) hours as needed. 04/02/21   Claiborne Rigg, NP  insulin NPH-regular Human (HUMULIN 70/30) (70-30) 100 UNIT/ML injection Inject 46 Units into the skin 2 (two) times daily with a meal. 04/05/23   Bradley Skeans, MD  losartan (COZAAR) 50 MG tablet Take 1 tablet (50 mg total) by mouth daily. 11/08/22   Bradley Skeans, MD  omeprazole (PRILOSEC) 20 MG capsule Take 1 capsule by mouth daily. Patient not taking: Reported on 02/23/2023 05/25/11   [provider]  omeprazole (PRILOSEC) 20 MG capsule Take 1 capsule (20 mg total) by mouth daily. Patient taking differently: Take 20 mg by mouth daily as needed. 04/28/22   Bradley Skeans, MD  ondansetron (ZOFRAN-ODT) 4 MG disintegrating tablet Take 1 tablet (4 mg total) by mouth every 8 (eight) hours as needed for nausea or vomiting. Patient not taking: Reported on 02/23/2023 09/27/21  Waldon Merl, PA-C  rosuvastatin (CRESTOR) 20 MG tablet Take 1 tablet by mouth once daily 05/28/23   Bradley Skeans, MD    Inpatient Medications: Scheduled Meds:  chlordiazePOXIDE  5 mg Oral QID   folic acid  1 mg Oral Daily   heparin  5,000 Units Subcutaneous Q12H   multivitamin with minerals  1 tablet Oral Daily   nicotine  7 mg Transdermal Daily   pantoprazole (PROTONIX) IV  40 mg Intravenous Q12H   sodium chloride flush  3 mL Intravenous Q12H   thiamine  100 mg Oral Daily   Or   thiamine  100 mg  Intravenous Daily   Continuous Infusions:  dextrose 75 mL/hr at 07/09/23 1026   PRN Meds: acetaminophen **OR** acetaminophen, hydrALAZINE, LORazepam **OR** LORazepam, morphine injection  Allergies:    Allergies  Allergen Reactions   Metformin And Related Diarrhea    Social History:   Social History   Socioeconomic History   Marital status: Married    Spouse name: Not on file   Number of children: Not on file   Years of education: Not on file   Highest education level: 12th grade  Occupational History   Not on file  Tobacco Use   Smoking status: Former    Current packs/day: 0.10    Average packs/day: 0.1 packs/day for 25.0 years (2.5 ttl pk-yrs)    Types: Cigarettes   Smokeless tobacco: Never   Tobacco comments:    "quit smoking cigarettes in 2013"  Vaping Use   Vaping status: Never Used  Substance and Sexual Activity   Alcohol use: Yes    Comment: occ   Drug use: No   Sexual activity: Yes  Other Topics Concern   Not on file  Social History Narrative   Not on file   Social Drivers of Health   Financial Resource Strain: Medium Risk (08/27/2022)   Overall Financial Resource Strain (CARDIA)    Difficulty of Paying Living Expenses: Somewhat hard  Food Insecurity: No Food Insecurity (07/08/2023)   Hunger Vital Sign    Worried About Running Out of Food in the Last Year: Never true    Ran Out of Food in the Last Year: Never true  Transportation Needs: No Transportation Needs (07/08/2023)   PRAPARE - Administrator, Civil Service (Medical): No    Lack of Transportation (Non-Medical): No  Physical Activity: Insufficiently Active (08/27/2022)   Exercise Vital Sign    Days of Exercise per Week: 5 days    Minutes of Exercise per Session: 10 min  Stress: No Stress Concern Present (08/27/2022)   [employer] of Occupational Health - Occupational Stress Questionnaire    Feeling of Stress : Not at all  Social Connections: Unknown (08/27/2022)   Social  Connection and Isolation Panel [NHANES]    Frequency of Communication with Friends and Family: Three times a week    Frequency of Social Gatherings with Friends and Family: Once a week    Attends Religious Services: Patient declined    Database administrator or Organizations: No    Attends Engineer, structural: Not on file    Marital Status: Married  Catering manager Violence: Not At Risk (07/08/2023)   Humiliation, Afraid, Rape, and Kick questionnaire    Fear of Current or Ex-Partner: No    Emotionally Abused: No    Physically Abused: No    Sexually Abused: No    Family History:    Family History  Problem Relation  Age of Onset   Hypertension Mother    Hypertension Father    Diabetes Paternal Grandmother    Heart disease Neg Hx    Colon cancer Neg Hx    Pancreatic cancer Neg Hx    Esophageal cancer Neg Hx    Stomach cancer Neg Hx    Rectal cancer Neg Hx      ROS:  Please see the history of present illness.   All other ROS reviewed and negative.     Physical Exam/Data:   Vitals:   07/08/23 2159 07/08/23 2349 07/09/23 0329 07/09/23 0329  BP: (!) 114/58 122/67 103/65 103/65  Pulse: 89 85 90 90  Resp: 19 17 17 17   Temp: 97.8 F (36.6 C) 98 F (36.7 C) 97.7 F (36.5 C) 97.7 F (36.5 C)  TempSrc: Oral Oral Oral Oral  SpO2: 96% 95% 95% 95%  Weight:      Height:        Intake/Output Summary (Last 24 hours) at 07/09/2023 1117 Last data filed at 07/09/2023 1610 Gross per 24 hour  Intake 1123.59 ml  Output 650 ml  Net 473.59 ml      07/08/2023   12:42 PM 02/23/2023    4:13 PM 02/05/2023    3:54 PM  Last 3 Weights  Weight (lbs) 240 lb 240 lb 234 lb  Weight (kg) 108.863 kg 108.863 kg 106.142 kg     Body mass index is 37.59 kg/m.  General:  Well nourished, well developed, in no acute distress HEENT: normal Neck: no JVD Vascular: No carotid bruits; Distal pulses 2+ bilaterally Cardiac:  normal S1, S2; RRR; no murmur  Lungs:  clear to auscultation  bilaterally, no wheezing, rhonchi or rales  Abd: soft, nontender, no hepatomegaly  Ext: no edema Musculoskeletal:  No deformities, BUE and BLE strength normal and equal Skin: warm and dry  Neuro:  CNs 2-12 intact, no focal abnormalities noted Psych:  Normal affect   EKG:  The EKG was personally reviewed and demonstrates:  sinus rhythm with HR 86, septal Q waves Telemetry:  Telemetry was personally reviewed and demonstrates:  sinus rhythm in the 80s, short run of atrial tachycardia  Relevant CV Studies:  Echo pending  Laboratory Data:  High Sensitivity Troponin:   Recent Labs  Lab 07/08/23 1243 07/08/23 1544  TROPONINIHS 12 14     Chemistry Recent Labs  Lab 07/08/23 1243 07/08/23 1303 07/09/23 0341  NA 132* 133* 131*  K 3.6 3.7 3.6  CL 99 99 100  CO2 21*  --  22  GLUCOSE 97 95 54*  BUN 12 13 18   CREATININE 1.40* 1.50* 1.53*  CALCIUM 8.9  --  9.2  GFRNONAA 58*  --  52*  ANIONGAP 12  --  9    Recent Labs  Lab 07/08/23 1842 07/09/23 0341  PROT 6.8 6.9  ALBUMIN 3.7 3.8  AST 35 29  ALT 27 25  ALKPHOS 55 56  BILITOT 0.8 0.8   Lipids No results for input(s): "CHOL", "TRIG", "HDL", "LABVLDL", "LDLCALC", "CHOLHDL" in the last 168 hours.  Hematology Recent Labs  Lab 07/08/23 1243 07/08/23 1303 07/09/23 0341  WBC 9.4  --  10.6*  RBC 5.14  --  4.93  HGB 14.6 15.0 14.0  HCT 42.3 44.0 40.7  MCV 82.3  --  82.6  MCH 28.4  --  28.4  MCHC 34.5  --  34.4  RDW 13.2  --  13.4  PLT 328  --  313   Thyroid  No results for input(s): "TSH", "FREET4" in the last 168 hours.  BNPNo results for input(s): "BNP", "PROBNP" in the last 168 hours.  DDimer No results for input(s): "DDIMER" in the last 168 hours.   Radiology/Studies:  CT Angio Chest/Abd/Pel for Dissection W and/or W/WO Result Date: 07/08/2023 CLINICAL DATA:  Aortic syndrome suspected. Chest pain. Hyperglycemia. EXAM: CT ANGIOGRAPHY CHEST, ABDOMEN AND PELVIS TECHNIQUE: Non-contrast CT of the chest was initially  obtained. Multidetector CT imaging through the chest, abdomen and pelvis was performed using the standard protocol during bolus administration of intravenous contrast. Multiplanar reconstructed images and MIPs were obtained and reviewed to evaluate the vascular anatomy. RADIATION DOSE REDUCTION: This exam was performed according to the departmental dose-optimization program which includes automated exposure control, adjustment of the mA and/or kV according to patient size and/or use of iterative reconstruction technique. CONTRAST:  OMNIPAQUE IOHEXOL 350 MG/ML SOLN COMPARISON:  None Available. FINDINGS: CTA CHEST FINDINGS Cardiovascular: Non IV contrast imaging demonstrates no intramural hematoma within the thoracic aorta. Contrast enhanced imaging demonstrates no acute findings of the aorta or great vessels. No pulmonary embolism. No pericardial fluid. No mediastinal hematoma. Mediastinum/Nodes: No axillary or supraclavicular adenopathy. No mediastinal or hilar adenopathy. No pericardial fluid. Esophagus normal. Lungs/Pleura: No pulmonary infarction. No pneumonia. No pleural fluid. No pneumothorax Musculoskeletal: No fracture. Review of the MIP images confirms the above findings. CTA ABDOMEN AND PELVIS FINDINGS VASCULAR Aorta: Normal caliber aorta without aneurysm, dissection, vasculitis or significant stenosis. Celiac: Patent without evidence of aneurysm, dissection, vasculitis or significant stenosis. SMA: Patent without evidence of aneurysm, dissection, vasculitis or significant stenosis. Renals: Both renal arteries are patent without evidence of aneurysm, dissection, vasculitis, fibromuscular dysplasia or significant stenosis. IMA: Patent without evidence of aneurysm, dissection, vasculitis or significant stenosis. Inflow: Patent without evidence of aneurysm, dissection, vasculitis or significant stenosis. Veins: No obvious venous abnormality within the limitations of this arterial phase study. Review of  the MIP images confirms the above findings. NON-VASCULAR Hepatobiliary: No focal hepatic lesion. Normal gallbladder. No biliary duct dilatation. Common bile duct is normal. Pancreas: Pancreas is normal. No ductal dilatation. No pancreatic inflammation. Spleen: Normal spleen Adrenals/urinary tract: Adrenal glands and kidneys are normal. The ureters and bladder normal. Stomach/Bowel: Stomach, small bowel, appendix, and cecum are normal. The colon and rectosigmoid colon are normal. Vascular/Lymphatic: Abdominal aorta is normal caliber. No periportal or retroperitoneal adenopathy. No pelvic adenopathy. Reproductive: Prostate unremarkable Other: No free fluid. Musculoskeletal: No aggressive osseous lesion. Review of the MIP images confirms the above findings. IMPRESSION: CHEST: 1. No acute aortic syndrome. 2. No pulmonary embolism. 3. No acute pulmonary findings. PELVIS: 1. No acute aortic syndrome. 2. No acute findings in the abdomen pelvis. Electronically Signed   By: Genevive Bi M.D.   On: 07/08/2023 16:38     Assessment and Plan:   Chest pain of unknown etiology --Troponins have been negative - EKG nonischemic, but septal Q waves - UDS positive for cocaine - used on Friday (today is Monday)  - Chest pain etiology could be CAD  in the setting of cocaine vs GERD -- will attempt a CT coronary today -- will need 15 mg corlanor   Hypertension - PTA: on 50 mg losartan - has been compliant on medications   Hyperlipidemia with LDL goal < 70 - needs updated lipid panel - has been maintained on 20 mg crestor - would draw LPA with next labs for risk stratification   Hypoglycemia DM2 with insulin use - A1c 7.3% - has had hypoglycemia  this admission - per primary - on trulicity, insulin   Obesity Intermittent tobacco abuse   CKD - unclear baseline - sCr this admission 1.53 - baseline unclear, but was normal 1 year ago   Cocaine abuse - UDS positive for cocaine - states this is  very infrequent, recommended cessation   Risk Assessment/Risk Scores:     TIMI Risk Score for Unstable Angina or Non-ST Elevation MI:   The patient's TIMI risk score is 1, which indicates a 5% risk of all cause mortality, new or recurrent myocardial infarction or need for urgent revascularization in the next 14 days.          For questions or updates, please contact [facility] Please consult [email or web address] for contact info under    Signed, Marcelino Duster, PA  07/09/2023 11:17 AM

## 2023-07-10 ENCOUNTER — Inpatient Hospital Stay (HOSPITAL_COMMUNITY): Payer: No Typology Code available for payment source

## 2023-07-10 DIAGNOSIS — E7800 Pure hypercholesterolemia, unspecified: Secondary | ICD-10-CM | POA: Diagnosis not present

## 2023-07-10 DIAGNOSIS — R079 Chest pain, unspecified: Secondary | ICD-10-CM | POA: Diagnosis not present

## 2023-07-10 DIAGNOSIS — E11649 Type 2 diabetes mellitus with hypoglycemia without coma: Secondary | ICD-10-CM | POA: Diagnosis not present

## 2023-07-10 DIAGNOSIS — N179 Acute kidney failure, unspecified: Secondary | ICD-10-CM | POA: Diagnosis not present

## 2023-07-10 DIAGNOSIS — I10 Essential (primary) hypertension: Secondary | ICD-10-CM | POA: Diagnosis not present

## 2023-07-10 LAB — LIPID PANEL
Cholesterol: 116 mg/dL (ref 0–200)
HDL: 30 mg/dL — ABNORMAL LOW (ref >40)
LDL Cholesterol: 48 mg/dL (ref 0–99)
Total CHOL/HDL Ratio: 3.9 ratio
Triglycerides: 192 mg/dL — ABNORMAL HIGH (ref <150)
VLDL: 38 mg/dL (ref 0–40)

## 2023-07-10 LAB — ECHOCARDIOGRAM COMPLETE
AR max vel: 2.05 cm2
AV Area VTI: 2.23 cm2
AV Area mean vel: 2.19 cm2
AV Mean grad: 6 mmHg
AV Peak grad: 9.7 mmHg
Ao pk vel: 1.56 m/s
Area-P 1/2: 3.05 cm2
Calc EF: 56.8 %
Height: 67 in
MV VTI: 2.23 cm2
S' Lateral: 3.7 cm
Single Plane A2C EF: 58 %
Single Plane A4C EF: 60.4 %
Weight: 3841.3 [oz_av]

## 2023-07-10 LAB — BASIC METABOLIC PANEL
Anion gap: 9 (ref 5–15)
BUN: 13 mg/dL (ref 6–20)
CO2: 20 mmol/L — ABNORMAL LOW (ref 22–32)
Calcium: 8.4 mg/dL — ABNORMAL LOW (ref 8.9–10.3)
Chloride: 107 mmol/L (ref 98–111)
Creatinine, Ser: 1.38 mg/dL — ABNORMAL HIGH (ref 0.61–1.24)
GFR, Estimated: 59 mL/min — ABNORMAL LOW (ref >60)
Glucose, Bld: 105 mg/dL — ABNORMAL HIGH (ref 70–99)
Potassium: 4.4 mmol/L (ref 3.5–5.1)
Sodium: 136 mmol/L (ref 135–145)

## 2023-07-10 LAB — GLUCOSE, CAPILLARY
Glucose-Capillary: 106 mg/dL — ABNORMAL HIGH (ref 70–99)
Glucose-Capillary: 146 mg/dL — ABNORMAL HIGH (ref 70–99)
Glucose-Capillary: 163 mg/dL — ABNORMAL HIGH (ref 70–99)
Glucose-Capillary: 197 mg/dL — ABNORMAL HIGH (ref 70–99)
Glucose-Capillary: 197 mg/dL — ABNORMAL HIGH (ref 70–99)

## 2023-07-10 LAB — MAGNESIUM: Magnesium: 2 mg/dL (ref 1.7–2.4)

## 2023-07-10 MED ORDER — METOPROLOL TARTRATE 100 MG PO TABS
100.0000 mg | ORAL_TABLET | Freq: Once | ORAL | Status: AC
  Administered 2023-07-10: 100 mg via ORAL

## 2023-07-10 MED ORDER — ROSUVASTATIN CALCIUM 20 MG PO TABS
20.0000 mg | ORAL_TABLET | Freq: Every day | ORAL | Status: DC
  Administered 2023-07-10: 20 mg via ORAL
  Filled 2023-07-10: qty 1

## 2023-07-10 MED ORDER — PANTOPRAZOLE SODIUM 40 MG PO TBEC: 40.0000 mg | DELAYED_RELEASE_TABLET | Freq: Every day | ORAL | 0 refills | Status: AC

## 2023-07-10 MED ORDER — ASPIRIN 81 MG PO TBEC: 81.0000 mg | DELAYED_RELEASE_TABLET | Freq: Every day | ORAL | 0 refills | Status: AC

## 2023-07-10 MED ORDER — NITROGLYCERIN 0.4 MG SL SUBL
SUBLINGUAL_TABLET | SUBLINGUAL | Status: AC
  Filled 2023-07-10: qty 2

## 2023-07-10 MED ORDER — IOHEXOL 350 MG/ML SOLN
95.0000 mL | Freq: Once | INTRAVENOUS | Status: AC | PRN
  Administered 2023-07-10: 95 mL via INTRAVENOUS

## 2023-07-10 MED ORDER — SODIUM CHLORIDE 0.9 % IV SOLN: INTRAVENOUS | Status: DC

## 2023-07-10 MED ORDER — METOPROLOL TARTRATE 100 MG PO TABS
100.0000 mg | ORAL_TABLET | Freq: Once | ORAL | Status: DC
  Filled 2023-07-10: qty 1

## 2023-07-10 NOTE — Progress Notes (Signed)
 Progress Note  Patient Name: Bradley Howe Date of Encounter: 07/10/2023  [facility] Cardiologist: Armanda Magic, MD   Patient Profile 58 y.o. male with a hx of hypertension, hyperlipidemia, GERD, DM with hypoglycemia, and cocaine abuse who is being seen 07/09/2023 for the evaluation of chest pain at the request of Dr. Hanley Ben.      Subjective   Denies any chest pain this morning.  Coronary CTA canceled yesterday because heart rate was elevated.  Plan to reattempt today with 100 mg of Lopressor  Inpatient Medications    Scheduled Meds:  folic acid  1 mg Oral Daily   heparin  5,000 Units Subcutaneous Q12H   multivitamin with minerals  1 tablet Oral Daily   nicotine  7 mg Transdermal Daily   pantoprazole (PROTONIX) IV  40 mg Intravenous Q12H   sodium chloride flush  3 mL Intravenous Q12H   thiamine  100 mg Oral Daily   Or   thiamine  100 mg Intravenous Daily   Continuous Infusions:  sodium chloride 75 mL/hr at 07/10/23 0813   PRN Meds: acetaminophen **OR** acetaminophen, hydrALAZINE, LORazepam **OR** LORazepam, morphine injection   Vital Signs    Vitals:   07/09/23 1512 07/09/23 1941 07/10/23 0500 07/10/23 0525  BP: 120/69 (!) 102/59  120/78  Pulse: 81 73  72  Resp: 18 18  18   Temp:  98.4 F (36.9 C)  98.3 F (36.8 C)  TempSrc:  Oral    SpO2: 96% 99%  98%  Weight:   108.9 kg   Height:        Intake/Output Summary (Last 24 hours) at 07/10/2023 0820 Last data filed at 07/10/2023 0819 Gross per 24 hour  Intake 1543.83 ml  Output 1500 ml  Net 43.83 ml      07/10/2023    5:00 AM 07/08/2023   12:42 PM 02/23/2023    4:13 PM  Last 3 Weights  Weight (lbs) 240 lb 1.3 oz 240 lb 240 lb  Weight (kg) 108.9 kg 108.863 kg 108.863 kg      Telemetry    Normal sinus rhythm- Personally Reviewed  ECG    No new EKG to review- Personally Reviewed  Physical Exam   GEN: No acute distress.   Neck: No JVD Cardiac: RRR, no murmurs, rubs, or gallops.  Respiratory:  Clear to auscultation bilaterally. GI: Soft, nontender, non-distended  MS: No edema; No deformity. Neuro:  Nonfocal  Psych: Normal affect   Labs    High Sensitivity Troponin:   Recent Labs  Lab 07/08/23 1243 07/08/23 1544  TROPONINIHS 12 14      Chemistry Recent Labs  Lab 07/08/23 1243 07/08/23 1303 07/08/23 1842 07/09/23 0341 07/10/23 0451  NA 132* 133*  --  131* 136  K 3.6 3.7  --  3.6 4.4  CL 99 99  --  100 107  CO2 21*  --   --  22 20*  GLUCOSE 97 95  --  54* 105*  BUN 12 13  --  18 13  CREATININE 1.40* 1.50*  --  1.53* 1.38*  CALCIUM 8.9  --   --  9.2 8.4*  PROT  --   --  6.8 6.9  --   ALBUMIN  --   --  3.7 3.8  --   AST  --   --  35 29  --   ALT  --   --  27 25  --   ALKPHOS  --   --  55 56  --   BILITOT  --   --  0.8 0.8  --   GFRNONAA 58*  --   --  52* 59*  ANIONGAP 12  --   --  9 9     Hematology Recent Labs  Lab 07/08/23 1243 07/08/23 1303 07/09/23 0341  WBC 9.4  --  10.6*  RBC 5.14  --  4.93  HGB 14.6 15.0 14.0  HCT 42.3 44.0 40.7  MCV 82.3  --  82.6  MCH 28.4  --  28.4  MCHC 34.5  --  34.4  RDW 13.2  --  13.4  PLT 328  --  313    BNPNo results for input(s): "BNP", "PROBNP" in the last 168 hours.   DDimer No results for input(s): "DDIMER" in the last 168 hours.  Radiology    CT Angio Chest/Abd/Pel for Dissection W and/or W/WO Result Date: 07/08/2023 CLINICAL DATA:  Aortic syndrome suspected. Chest pain. Hyperglycemia. EXAM: CT ANGIOGRAPHY CHEST, ABDOMEN AND PELVIS TECHNIQUE: Non-contrast CT of the chest was initially obtained. Multidetector CT imaging through the chest, abdomen and pelvis was performed using the standard protocol during bolus administration of intravenous contrast. Multiplanar reconstructed images and MIPs were obtained and reviewed to evaluate the vascular anatomy. RADIATION DOSE REDUCTION: This exam was performed according to the departmental dose-optimization program which includes automated exposure control, adjustment  of the mA and/or kV according to patient size and/or use of iterative reconstruction technique. CONTRAST:  OMNIPAQUE IOHEXOL 350 MG/ML SOLN COMPARISON:  None Available. FINDINGS: CTA CHEST FINDINGS Cardiovascular: Non IV contrast imaging demonstrates no intramural hematoma within the thoracic aorta. Contrast enhanced imaging demonstrates no acute findings of the aorta or great vessels. No pulmonary embolism. No pericardial fluid. No mediastinal hematoma. Mediastinum/Nodes: No axillary or supraclavicular adenopathy. No mediastinal or hilar adenopathy. No pericardial fluid. Esophagus normal. Lungs/Pleura: No pulmonary infarction. No pneumonia. No pleural fluid. No pneumothorax Musculoskeletal: No fracture. Review of the MIP images confirms the above findings. CTA ABDOMEN AND PELVIS FINDINGS VASCULAR Aorta: Normal caliber aorta without aneurysm, dissection, vasculitis or significant stenosis. Celiac: Patent without evidence of aneurysm, dissection, vasculitis or significant stenosis. SMA: Patent without evidence of aneurysm, dissection, vasculitis or significant stenosis. Renals: Both renal arteries are patent without evidence of aneurysm, dissection, vasculitis, fibromuscular dysplasia or significant stenosis. IMA: Patent without evidence of aneurysm, dissection, vasculitis or significant stenosis. Inflow: Patent without evidence of aneurysm, dissection, vasculitis or significant stenosis. Veins: No obvious venous abnormality within the limitations of this arterial phase study. Review of the MIP images confirms the above findings. NON-VASCULAR Hepatobiliary: No focal hepatic lesion. Normal gallbladder. No biliary duct dilatation. Common bile duct is normal. Pancreas: Pancreas is normal. No ductal dilatation. No pancreatic inflammation. Spleen: Normal spleen Adrenals/urinary tract: Adrenal glands and kidneys are normal. The ureters and bladder normal. Stomach/Bowel: Stomach, small bowel, appendix, and cecum are  normal. The colon and rectosigmoid colon are normal. Vascular/Lymphatic: Abdominal aorta is normal caliber. No periportal or retroperitoneal adenopathy. No pelvic adenopathy. Reproductive: Prostate unremarkable Other: No free fluid. Musculoskeletal: No aggressive osseous lesion. Review of the MIP images confirms the above findings. IMPRESSION: CHEST: 1. No acute aortic syndrome. 2. No pulmonary embolism. 3. No acute pulmonary findings. PELVIS: 1. No acute aortic syndrome. 2. No acute findings in the abdomen pelvis. Electronically Signed   By: Genevive Bi M.D.   On: 07/08/2023 16:38    Patient Profile     58 y.o. male with a hx of hypertension, hyperlipidemia, GERD,  DM with hypoglycemia, and cocaine abuse who is being seen 07/09/2023 for the evaluation of chest pain at the request of Dr. Hanley Ben.   Assessment & Plan    Chest pain Multiple CRFs This patient presented with atypical chest pain in the setting of cocaine use the night prior and ongoing tobacco abuse.  He has history of significant alcohol use as well as hyperlipidemia and hypertension as well as diabetes and therefore at risk for CAD. -Despite 6 hours of constant chest discomfort his high-sensitivity troponin is normal at 14 and 14 -EKG is nonischemic but shows possible old septal infarct -UDS + cocaine -He has been having more GERD symptoms recently and does continue to drink alcohol -Suspect his symptoms may be GI in nature but given his significant cardiac risk factors further ischemic workup is indicated -coronary CTA attempted yesterday but could not get heart rate controlled using ivabradine and low-dose beta-blocker -Will reattempt coronary CTA today with 100 mg Lopressor -echo is pending -Continue Protonix 40 mg twice daily for GERD  HLD -LDL goal <100 and <70 if found to have CAD -He does have a history of hyperlipidemia and at home has been on Crestor 20 mg daily. -Will check fasting lipid panel, LP(a) -Restart home  dose of Crestor 20 mg daily   Hypertension -BP remains well-controlled -He was on losartan 50 mg daily at home but this is on hold due to some AKI   AKI -Suspect related to dehydration as he says he has not been taking in enough fluids -Serum creatinine 0.95 on 12/23/2021 and 1.4 this admission -Currently getting IV fluids with improvement from 1.53->>1.38 today   Type 2 diabetes mellitus -He uses insulin and Trulicity but has had some issues recently with hypoglycemia -Hemoglobin A1c 7.3%   Polysubstance abuse -UDS positive for cocaine -He admits to alcohol use as well as tobacco use ongoing      For questions or updates, please contact [facility] Please consult [email or web address] for contact info under        Signed, Armanda Magic, MD  07/10/2023, 8:20 AM

## 2023-07-10 NOTE — TOC Transition Note (Signed)
 Transition of Care [facility]) - Discharge Note   Patient Details  Name: Bradley Howe MRN: [id number] Date of Birth: June 25, 1965  [facility] of [facility]) CM/SW Contact:  Tom-Johnson, Hershal Coria, RN Phone Number: 07/10/2023, 5:40 PM   Clinical Narrative:     Patient is scheduled for discharge today.  Readmission Risk Assessment done. Outpatient f/u, hospital f/u and discharge instructions on AVS. No TOC needs or recommendations noted. Wife, [name] to transport at discharge.  No further TOC needs noted.        Patient Goals and CMS Choice            Discharge Placement                       Discharge Plan and Services Additional resources added to the After Visit Summary for                                       Social Drivers of Health (SDOH) Interventions SDOH Screenings   Food Insecurity: No Food Insecurity (07/08/2023)  Housing: Low Risk  (07/10/2023)  Transportation Needs: No Transportation Needs (07/08/2023)  Utilities: Not At Risk (07/08/2023)  Alcohol Screen: Low Risk  (08/27/2022)  Depression (PHQ2-9): Low Risk  (02/05/2023)  Financial Resource Strain: Medium Risk (08/27/2022)  Physical Activity: Insufficiently Active (08/27/2022)  Social Connections: Unknown (08/27/2022)  Stress: No Stress Concern Present (08/27/2022)  Tobacco Use: Medium Risk (07/08/2023)  Health Literacy: Adequate Health Literacy (02/05/2023)     Readmission Risk Interventions    07/10/2023    5:39 PM  Readmission Risk Prevention Plan  Post Dischage Appt Complete  Medication Screening Complete  Transportation Screening Complete

## 2023-07-10 NOTE — Progress Notes (Signed)
 PROGRESS NOTE    Jonanthan Bolender  ZOX:[id number] DOB: [birth year] DOA: 07/08/2023 PCP: Georganna Skeans, MD   Brief Narrative:  58 y.o. male with past medical history  of hypertension, diabetes mellitus type 2, hypoglycemia, dyslipidemia, GERD, history of cocaine abuse presented with hypoglycemia, chest pain and diarrhea.  On presentation, CT angio dissection study was negative.  High-sensitivity troponins were 12 and 14.  UDS was positive for cocaine.  Creatinine was 1.5.  He had episodes of hypoglycemia requiring dextrose drip.  Cardiology was consulted.  Assessment & Plan:   Hypoglycemia in a patient with diabetes mellitus type 2 -Currently on D10 drip.  Blood sugars are now intermittently elevated.  DC D10 drip.  Continue CBG monitoring.  70/30 insulin on hold.  A1c 7.3  Chest pain -Possibly from cocaine abuse.  Troponins negative.  EKG nonischemic.  -Cardiology following and has ordered coronary CT.  Echo pending.  Follow recommendations.  Acute kidney injury -Possibly from dehydration.  Creatinine improving to 1.38 today.  Switch IV fluids to normal saline at 75 cc an hour.  Monitor  Hyponatremia -Resolved  Obesity class II -Outpatient follow-up  Cocaine abuse -Counseled regarding cessation.  TOC consult  GERD -Continue IV Protonix  Essential hypertension -Blood pressure currently stable.  Hydralazine and losartan on hold  DVT prophylaxis: Subcutaneous heparin Code Status: Full Family Communication: None at bedside Disposition Plan: Status is: inpatient because: Of severity of illness    Consultants: cardiology  Procedures: None  Antimicrobials: None   Subjective: Patient seen and examined at bedside.  Denies current chest pain.  Feels slightly better.  No fever, shortness of breath, worsening abdominal pain reported. Objective: Vitals:   07/09/23 1512 07/09/23 1941 07/10/23 0500 07/10/23 0525  BP: 120/69 (!) 102/59  120/78  Pulse: 81 73  72  Resp: 18 18  18    Temp:  98.4 F (36.9 C)  98.3 F (36.8 C)  TempSrc:  Oral    SpO2: 96% 99%  98%  Weight:   108.9 kg   Height:        Intake/Output Summary (Last 24 hours) at 07/10/2023 0730 Last data filed at 07/10/2023 0719 Gross per 24 hour  Intake 733.83 ml  Output 1500 ml  Net -766.17 ml   Filed Weights   07/08/23 1242 07/10/23 0500  Weight: 108.9 kg 108.9 kg    Examination:  General: Currently on room air.  No distress. ENT/neck: No thyromegaly.  JVD is not elevated  respiratory: Decreased breath sounds at bases bilaterally with some crackles; no wheezing  CVS: S1-S2 heard, rate controlled currently Abdominal: Soft, obese, nontender, slightly distended; no organomegaly, bowel sounds are heard Extremities: Trace lower extremity edema; no cyanosis  CNS: Awake and alert.  No focal neurologic deficit.  Moves extremities Lymph: No obvious lymphadenopathy Skin: No obvious ecchymosis/lesions  psych: Mostly flat affect.  Currently not agitated.   Musculoskeletal: No obvious joint swelling/deformity   Data Reviewed: I have personally reviewed following labs and imaging studies  CBC: Recent Labs  Lab 07/08/23 1243 07/08/23 1303 07/09/23 0341  WBC 9.4  --  10.6*  HGB 14.6 15.0 14.0  HCT 42.3 44.0 40.7  MCV 82.3  --  82.6  PLT 328  --  313   Basic Metabolic Panel: Recent Labs  Lab 07/08/23 1243 07/08/23 1303 07/09/23 0341 07/10/23 0451  NA 132* 133* 131* 136  K 3.6 3.7 3.6 4.4  CL 99 99 100 107  CO2 21*  --  22 20*  GLUCOSE 97  95 54* 105*  BUN 12 13 18 13   CREATININE 1.40* 1.50* 1.53* 1.38*  CALCIUM 8.9  --  9.2 8.4*  MG  --   --   --  2.0   GFR: Estimated Creatinine Clearance: 67.8 mL/min (A) (by C-G formula based on SCr of 1.38 mg/dL (H)). Liver Function Tests: Recent Labs  Lab 07/08/23 1842 07/09/23 0341  AST 35 29  ALT 27 25  ALKPHOS 55 56  BILITOT 0.8 0.8  PROT 6.8 6.9  ALBUMIN 3.7 3.8   Recent Labs  Lab 07/08/23 1842  LIPASE 49   No results for  input(s): "AMMONIA" in the last 168 hours. Coagulation Profile: No results for input(s): "INR", "PROTIME" in the last 168 hours. Cardiac Enzymes: No results for input(s): "CKTOTAL", "CKMB", "CKMBINDEX", "TROPONINI" in the last 168 hours. BNP (last 3 results) No results for input(s): "PROBNP" in the last 8760 hours. HbA1C: Recent Labs    07/09/23 0341  HGBA1C 7.3*   CBG: Recent Labs  Lab 07/09/23 1337 07/09/23 1603 07/09/23 1945 07/10/23 0043 07/10/23 0522  GLUCAP 110* 124* 229* 197* 106*   Lipid Profile: No results for input(s): "CHOL", "HDL", "LDLCALC", "TRIG", "CHOLHDL", "LDLDIRECT" in the last 72 hours. Thyroid Function Tests: No results for input(s): "TSH", "T4TOTAL", "FREET4", "T3FREE", "THYROIDAB" in the last 72 hours. Anemia Panel: Recent Labs    07/09/23 0341  VITAMINB12 302   Sepsis Labs: No results for input(s): "PROCALCITON", "LATICACIDVEN" in the last 168 hours.  No results found for this or any previous visit (from the past 240 hours).       Radiology Studies: CT Angio Chest/Abd/Pel for Dissection W and/or W/WO Result Date: 07/08/2023 CLINICAL DATA:  Aortic syndrome suspected. Chest pain. Hyperglycemia. EXAM: CT ANGIOGRAPHY CHEST, ABDOMEN AND PELVIS TECHNIQUE: Non-contrast CT of the chest was initially obtained. Multidetector CT imaging through the chest, abdomen and pelvis was performed using the standard protocol during bolus administration of intravenous contrast. Multiplanar reconstructed images and MIPs were obtained and reviewed to evaluate the vascular anatomy. RADIATION DOSE REDUCTION: This exam was performed according to the departmental dose-optimization program which includes automated exposure control, adjustment of the mA and/or kV according to patient size and/or use of iterative reconstruction technique. CONTRAST:  OMNIPAQUE IOHEXOL 350 MG/ML SOLN COMPARISON:  None Available. FINDINGS: CTA CHEST FINDINGS Cardiovascular: Non IV contrast  imaging demonstrates no intramural hematoma within the thoracic aorta. Contrast enhanced imaging demonstrates no acute findings of the aorta or great vessels. No pulmonary embolism. No pericardial fluid. No mediastinal hematoma. Mediastinum/Nodes: No axillary or supraclavicular adenopathy. No mediastinal or hilar adenopathy. No pericardial fluid. Esophagus normal. Lungs/Pleura: No pulmonary infarction. No pneumonia. No pleural fluid. No pneumothorax Musculoskeletal: No fracture. Review of the MIP images confirms the above findings. CTA ABDOMEN AND PELVIS FINDINGS VASCULAR Aorta: Normal caliber aorta without aneurysm, dissection, vasculitis or significant stenosis. Celiac: Patent without evidence of aneurysm, dissection, vasculitis or significant stenosis. SMA: Patent without evidence of aneurysm, dissection, vasculitis or significant stenosis. Renals: Both renal arteries are patent without evidence of aneurysm, dissection, vasculitis, fibromuscular dysplasia or significant stenosis. IMA: Patent without evidence of aneurysm, dissection, vasculitis or significant stenosis. Inflow: Patent without evidence of aneurysm, dissection, vasculitis or significant stenosis. Veins: No obvious venous abnormality within the limitations of this arterial phase study. Review of the MIP images confirms the above findings. NON-VASCULAR Hepatobiliary: No focal hepatic lesion. Normal gallbladder. No biliary duct dilatation. Common bile duct is normal. Pancreas: Pancreas is normal. No ductal dilatation. No pancreatic inflammation. Spleen:  Normal spleen Adrenals/urinary tract: Adrenal glands and kidneys are normal. The ureters and bladder normal. Stomach/Bowel: Stomach, small bowel, appendix, and cecum are normal. The colon and rectosigmoid colon are normal. Vascular/Lymphatic: Abdominal aorta is normal caliber. No periportal or retroperitoneal adenopathy. No pelvic adenopathy. Reproductive: Prostate unremarkable Other: No free fluid.  Musculoskeletal: No aggressive osseous lesion. Review of the MIP images confirms the above findings. IMPRESSION: CHEST: 1. No acute aortic syndrome. 2. No pulmonary embolism. 3. No acute pulmonary findings. PELVIS: 1. No acute aortic syndrome. 2. No acute findings in the abdomen pelvis. Electronically Signed   By: Genevive Bi M.D.   On: 07/08/2023 16:38        Scheduled Meds:  folic acid  1 mg Oral Daily   heparin  5,000 Units Subcutaneous Q12H   multivitamin with minerals  1 tablet Oral Daily   nicotine  7 mg Transdermal Daily   pantoprazole (PROTONIX) IV  40 mg Intravenous Q12H   sodium chloride flush  3 mL Intravenous Q12H   thiamine  100 mg Oral Daily   Or   thiamine  100 mg Intravenous Daily   Continuous Infusions:  dextrose 5 % and 0.9 % NaCl 75 mL/hr at 07/09/23 2148          Glade Lloyd, MD [facility] 07/10/2023, 7:30 AM

## 2023-07-10 NOTE — Progress Notes (Signed)
 Discharge instructions reviewed with pt.  Copy of instructions given to pt. Pt informed his scripts were sent to his pharmacy for pick up. Pt getting dressed at this time, family is here at the bedside now. Pt needed a note for his work, and his wife needed a note for work since she has been out with him, MD notified and work note printed and copies given to pt.  Pt will be d/c'd via wheelchair with belongings, with his family and will be         escorted by staff.   Fajr Fife,RN SWOT

## 2023-07-10 NOTE — Plan of Care (Signed)

## 2023-07-10 NOTE — Discharge Summary (Signed)
 Physician Discharge Summary  Bradley Howe ZOX:[id number] DOB: 12-22-1965 DOA: 07/08/2023  PCP: Bradley Skeans, MD  Admit date: 07/08/2023 Discharge date: 07/10/2023  Admitted From: Home Disposition: Home  Recommendations for Outpatient Follow-up:  Follow up with PCP in 1 week with repeat CBC/BMP Follow up in ED if symptoms worsen or new appear   Home Health: No Equipment/Devices: None  Discharge Condition: Stable CODE STATUS: Full Diet recommendation: Heart healthy/carb modified  Brief/Interim Summary: 58 y.o. male with past medical history  of hypertension, diabetes mellitus type 2, hypoglycemia, dyslipidemia, GERD, history of cocaine abuse presented with hypoglycemia, chest pain and diarrhea.  On presentation, CT angio dissection study was negative.  High-sensitivity troponins were 12 and 14.  UDS was positive for cocaine.  Creatinine was 1.5.  He had episodes of hypoglycemia requiring dextrose drip.  Cardiology was consulted.  During the hospitalizing, he was treated with D10 drip.  Blood sugars subsequently improved and he Denti-Pro discontinued.  He underwent coronary CTA which apparently showed minimal CAD.  Cardiology has cleared the patient for discharge on aspirin and statin.  He feels much better and is able to go home today.  Discharge patient home today with outpatient follow-up with PCP.  Discharge Diagnoses:   Hypoglycemia in a patient with diabetes mellitus type 2 -Treated with D10 drip.  Blood sugars are now intermittently elevated.  DC'd D10 drip today. 70/30 insulin to remain on hold till reevaluation by PCP.  A1c 7.3.  Continue carb modified diet.   Chest pain -Possibly from cocaine abuse with possibility of GERD.  Troponins negative.  EKG nonischemic.  -Echo showed EF of 55 to 60% with grade 2 diastolic dysfunction.   - He underwent coronary CTA which apparently showed minimal CAD.  Cardiology has cleared the patient for discharge on aspirin and statin.  - He feels  much better and is able to go home today.  Discharge patient home today with outpatient follow-up with PCP. -Continue oral Protonix once a day on discharge.   Acute kidney injury -Possibly from dehydration.  Creatinine improving to 1.38 today.  Outpatient follow-up.  Hyponatremia -Resolved   Obesity class II -Outpatient follow-up   Cocaine abuse -Counseled regarding cessation.  TOC consulted   GERD -Continue oral Protonix   Essential hypertension -Blood pressure currently stable.  Hydralazine and losartan on hold    Discharge Instructions  Discharge Instructions     Diet - low sodium heart healthy   Complete by: As directed    Diet Carb Modified   Complete by: As directed    Increase activity slowly   Complete by: As directed       Allergies as of 07/10/2023       Reactions   Metformin And Related Diarrhea        Medication List     STOP taking these medications    HumuLIN 70/30 (70-30) 100 UNIT/ML injection Generic drug: insulin NPH-regular Human   losartan 50 MG tablet Commonly known as: COZAAR   omeprazole 20 MG capsule Commonly known as: PRILOSEC       TAKE these medications    aspirin EC 81 MG tablet Take 1 tablet (81 mg total) by mouth daily. Swallow whole.   FreeStyle Libre 2 [employer] as recommended to monitor blood glucose.Change every 14 days.   gabapentin 300 MG capsule Commonly known as: NEURONTIN Take 1 capsule (300 mg total) by mouth 3 (three) times daily. What changed:  how much to take when to take this  pantoprazole 40 MG tablet Commonly known as: Protonix Take 1 tablet (40 mg total) by mouth daily.   rosuvastatin 20 MG tablet Commonly known as: CRESTOR Take 1 tablet by mouth once daily   Trulicity 1.5 MG/0.5ML Soaj Generic drug: Dulaglutide Inject 1.5 mg into the skin once a week.        Follow-up Information     Bradley Skeans, MD. Schedule an appointment as soon as possible for a visit in 1  week(s).   Specialty: Family Medicine Contact information: [address] [place] [address] [phone]                Allergies  Allergen Reactions   Metformin And Related Diarrhea    Consultations: Cardiology   Procedures/Studies: ECHOCARDIOGRAM COMPLETE Result Date: 07/10/2023    ECHOCARDIOGRAM REPORT   Patient Name:   Bradley Howe Date of Exam: 07/10/2023 Medical Rec #:  [id number]    Height:       67.0 in Accession #:    [id number]   Weight:       240.1 lb Date of Birth:  1965-11-28     BSA:          2.185 m Patient Age:    58 years     BP:           120/78 mmHg Patient Gender: M            HR:           70 bpm. Exam Location:  Inpatient Procedure: 2D Echo, Cardiac Doppler and Color Doppler (Both Spectral and Color            Flow Doppler were utilized during procedure). Indications:    Chest Pain  History:        Patient has no prior history of Echocardiogram examinations.                 Risk Factors:Hypertension and Diabetes.  Sonographer:    Amy Chionchio Referring Phys: Bradley Howe [facility] IMPRESSIONS  1. Left ventricular ejection fraction, by estimation, is 55 to 60%. The left ventricle has normal function. The left ventricle has no regional wall motion abnormalities. Left ventricular diastolic parameters are consistent with Grade II diastolic dysfunction (pseudonormalization).  2. Right ventricular systolic function is normal. The right ventricular size is normal. Tricuspid regurgitation signal is inadequate for assessing PA pressure.  3. The mitral valve is normal in structure. No evidence of mitral valve regurgitation. No evidence of mitral stenosis.  4. The aortic valve is tricuspid. Aortic valve regurgitation is not visualized. No aortic stenosis is present.  5. The inferior vena cava is normal in size with greater than 50% respiratory variability, suggesting right atrial pressure of 3 mmHg. FINDINGS  Left Ventricle: Left ventricular ejection fraction, by  estimation, is 55 to 60%. The left ventricle has normal function. The left ventricle has no regional wall motion abnormalities. Strain imaging was not performed. The left ventricular internal cavity  size was normal in size. There is no left ventricular hypertrophy. Left ventricular diastolic parameters are consistent with Grade II diastolic dysfunction (pseudonormalization). Right Ventricle: The right ventricular size is normal. No increase in right ventricular wall thickness. Right ventricular systolic function is normal. Tricuspid regurgitation signal is inadequate for assessing PA pressure. Left Atrium: Left atrial size was normal in size. Right Atrium: Right atrial size was normal in size. Pericardium: There is no evidence of pericardial effusion. Mitral Valve: The mitral valve is normal in structure.  No evidence of mitral valve regurgitation. No evidence of mitral valve stenosis. MV peak gradient, 3.6 mmHg. The mean mitral valve gradient is 2.0 mmHg. Tricuspid Valve: The tricuspid valve is normal in structure. Tricuspid valve regurgitation is not demonstrated. Aortic Valve: The aortic valve is tricuspid. Aortic valve regurgitation is not visualized. No aortic stenosis is present. Aortic valve mean gradient measures 6.0 mmHg. Aortic valve peak gradient measures 9.7 mmHg. Aortic valve area, by VTI measures 2.23 cm. Pulmonic Valve: The pulmonic valve was normal in structure. Pulmonic valve regurgitation is not visualized. Aorta: The aortic root is normal in size and structure. Venous: The inferior vena cava is normal in size with greater than 50% respiratory variability, suggesting right atrial pressure of 3 mmHg. IAS/Shunts: No atrial level shunt detected by color flow Doppler. Additional Comments: 3D imaging was not performed.  LEFT VENTRICLE PLAX 2D LVIDd:         5.20 cm      Diastology LVIDs:         3.70 cm      LV e' medial:    6.74 cm/s LV PW:         1.10 cm      LV E/e' medial:  13.0 LV IVS:         0.90 cm      LV e' lateral:   8.59 cm/s LVOT diam:     2.00 cm      LV E/e' lateral: 10.2 LV SV:         63 LV SV Index:   29 LVOT Area:     3.14 cm  LV Volumes (MOD) LV vol d, MOD A2C: 100.0 ml LV vol d, MOD A4C: 131.0 ml LV vol s, MOD A2C: 42.0 ml LV vol s, MOD A4C: 51.9 ml LV SV MOD A2C:     58.0 ml LV SV MOD A4C:     131.0 ml LV SV MOD BP:      65.3 ml RIGHT VENTRICLE             IVC RV Basal diam:  3.40 cm     IVC diam: 1.70 cm RV S prime:     11.70 cm/s TAPSE (M-mode): 2.1 cm LEFT ATRIUM             Index        RIGHT ATRIUM           Index LA Vol (A2C):   63.5 ml 29.06 ml/m  RA Area:     14.50 cm LA Vol (A4C):   59.4 ml 27.18 ml/m  RA Volume:   34.50 ml  15.79 ml/m LA Biplane Vol: 61.4 ml 28.09 ml/m  AORTIC VALVE                     PULMONIC VALVE AV Area (Vmax):    2.05 cm      PV Vmax:       0.94 m/s AV Area (Vmean):   2.19 cm      PV Peak grad:  3.5 mmHg AV Area (VTI):     2.23 cm AV Vmax:           156.00 cm/s AV Vmean:          110.000 cm/s AV VTI:            0.284 m AV Peak Grad:      9.7 mmHg AV Mean Grad:      6.0 mmHg LVOT  Vmax:         102.00 cm/s LVOT Vmean:        76.700 cm/s LVOT VTI:          0.202 m LVOT/AV VTI ratio: 0.71  AORTA Ao Root diam: 3.00 cm Ao Asc diam:  3.50 cm MITRAL VALVE MV Area (PHT): 3.05 cm    SHUNTS MV Area VTI:   2.23 cm    Systemic VTI:  0.20 m MV Peak grad:  3.6 mmHg    Systemic Diam: 2.00 cm MV Mean grad:  2.0 mmHg MV Vmax:       0.95 m/s MV Vmean:      59.5 cm/s MV Decel Time: 249 msec MV E velocity: 87.50 cm/s MV A velocity: 71.30 cm/s MV E/A ratio:  1.23 Dalton McleanMD Electronically signed by Wilfred Lacy Signature Date/Time: 07/10/2023/10:00:41 AM    Final    CT Angio Chest/Abd/Pel for Dissection W and/or W/WO Result Date: 07/08/2023 CLINICAL DATA:  Aortic syndrome suspected. Chest pain. Hyperglycemia. EXAM: CT ANGIOGRAPHY CHEST, ABDOMEN AND PELVIS TECHNIQUE: Non-contrast CT of the chest was initially obtained. Multidetector CT imaging through the  chest, abdomen and pelvis was performed using the standard protocol during bolus administration of intravenous contrast. Multiplanar reconstructed images and MIPs were obtained and reviewed to evaluate the vascular anatomy. RADIATION DOSE REDUCTION: This exam was performed according to the departmental dose-optimization program which includes automated exposure control, adjustment of the mA and/or kV according to patient size and/or use of iterative reconstruction technique. CONTRAST:  OMNIPAQUE IOHEXOL 350 MG/ML SOLN COMPARISON:  None Available. FINDINGS: CTA CHEST FINDINGS Cardiovascular: Non IV contrast imaging demonstrates no intramural hematoma within the thoracic aorta. Contrast enhanced imaging demonstrates no acute findings of the aorta or great vessels. No pulmonary embolism. No pericardial fluid. No mediastinal hematoma. Mediastinum/Nodes: No axillary or supraclavicular adenopathy. No mediastinal or hilar adenopathy. No pericardial fluid. Esophagus normal. Lungs/Pleura: No pulmonary infarction. No pneumonia. No pleural fluid. No pneumothorax Musculoskeletal: No fracture. Review of the MIP images confirms the above findings. CTA ABDOMEN AND PELVIS FINDINGS VASCULAR Aorta: Normal caliber aorta without aneurysm, dissection, vasculitis or significant stenosis. Celiac: Patent without evidence of aneurysm, dissection, vasculitis or significant stenosis. SMA: Patent without evidence of aneurysm, dissection, vasculitis or significant stenosis. Renals: Both renal arteries are patent without evidence of aneurysm, dissection, vasculitis, fibromuscular dysplasia or significant stenosis. IMA: Patent without evidence of aneurysm, dissection, vasculitis or significant stenosis. Inflow: Patent without evidence of aneurysm, dissection, vasculitis or significant stenosis. Veins: No obvious venous abnormality within the limitations of this arterial phase study. Review of the MIP images confirms the above findings.  NON-VASCULAR Hepatobiliary: No focal hepatic lesion. Normal gallbladder. No biliary duct dilatation. Common bile duct is normal. Pancreas: Pancreas is normal. No ductal dilatation. No pancreatic inflammation. Spleen: Normal spleen Adrenals/urinary tract: Adrenal glands and kidneys are normal. The ureters and bladder normal. Stomach/Bowel: Stomach, small bowel, appendix, and cecum are normal. The colon and rectosigmoid colon are normal. Vascular/Lymphatic: Abdominal aorta is normal caliber. No periportal or retroperitoneal adenopathy. No pelvic adenopathy. Reproductive: Prostate unremarkable Other: No free fluid. Musculoskeletal: No aggressive osseous lesion. Review of the MIP images confirms the above findings. IMPRESSION: CHEST: 1. No acute aortic syndrome. 2. No pulmonary embolism. 3. No acute pulmonary findings. PELVIS: 1. No acute aortic syndrome. 2. No acute findings in the abdomen pelvis. Electronically Signed   By: Genevive Bi M.D.   On: 07/08/2023 16:38      Subjective: Patient seen and examined at  bedside. Denies current chest pain. Feels slightly better. No fever, shortness of breath, worsening abdominal pain reported.   Discharge Exam: Vitals:   07/10/23 1022 07/10/23 1621  BP: 130/73 122/65  Pulse: 80 78  Resp: 18 18  Temp: 98 F (36.7 C) 98.5 F (36.9 C)  SpO2: 100% 99%    General: Currently on room air.  No distress. ENT/neck: No thyromegaly.  JVD is not elevated  respiratory: Decreased breath sounds at bases bilaterally with some crackles; no wheezing  CVS: S1-S2 heard, rate controlled currently Abdominal: Soft, obese, nontender, slightly distended; no organomegaly, bowel sounds are heard Extremities: Trace lower extremity edema; no cyanosis  CNS: Awake and alert.  No focal neurologic deficit.  Moves extremities Lymph: No obvious lymphadenopathy Skin: No obvious ecchymosis/lesions  psych: Mostly flat affect.  Currently not agitated.   Musculoskeletal: No obvious  joint swelling/deformity    The results of significant diagnostics from this hospitalization (including imaging, microbiology, ancillary and laboratory) are listed below for reference.     Microbiology: No results found for this or any previous visit (from the past 240 hours).   Labs: BNP (last 3 results) No results for input(s): "BNP" in the last 8760 hours. Basic Metabolic Panel: Recent Labs  Lab 07/08/23 1243 07/08/23 1303 07/09/23 0341 07/10/23 0451  NA 132* 133* 131* 136  K 3.6 3.7 3.6 4.4  CL 99 99 100 107  CO2 21*  --  22 20*  GLUCOSE 97 95 54* 105*  BUN 12 13 18 13   CREATININE 1.40* 1.50* 1.53* 1.38*  CALCIUM 8.9  --  9.2 8.4*  MG  --   --   --  2.0   Liver Function Tests: Recent Labs  Lab 07/08/23 1842 07/09/23 0341  AST 35 29  ALT 27 25  ALKPHOS 55 56  BILITOT 0.8 0.8  PROT 6.8 6.9  ALBUMIN 3.7 3.8   Recent Labs  Lab 07/08/23 1842  LIPASE 49   No results for input(s): "AMMONIA" in the last 168 hours. CBC: Recent Labs  Lab 07/08/23 1243 07/08/23 1303 07/09/23 0341  WBC 9.4  --  10.6*  HGB 14.6 15.0 14.0  HCT 42.3 44.0 40.7  MCV 82.3  --  82.6  PLT 328  --  313   Cardiac Enzymes: No results for input(s): "CKTOTAL", "CKMB", "CKMBINDEX", "TROPONINI" in the last 168 hours. BNP: Invalid input(s): "POCBNP" CBG: Recent Labs  Lab 07/10/23 0043 07/10/23 0522 07/10/23 0737 07/10/23 1125 07/10/23 1620  GLUCAP 197* 106* 163* 146* 197*   D-Dimer No results for input(s): "DDIMER" in the last 72 hours. Hgb A1c Recent Labs    07/09/23 0341  HGBA1C 7.3*   Lipid Profile Recent Labs    07/10/23 0839  CHOL 116  HDL 30*  LDLCALC 48  TRIG 960*  CHOLHDL 3.9   Thyroid function studies No results for input(s): "TSH", "T4TOTAL", "T3FREE", "THYROIDAB" in the last 72 hours.  Invalid input(s): "FREET3" Anemia work up Recent Labs    07/09/23 0341  VITAMINB12 302   Urinalysis    Component Value Date/Time   COLORURINE YELLOW 01/11/2013  1835   APPEARANCEUR CLEAR 01/11/2013 1835   LABSPEC >=1.030 09/07/2017 1122   PHURINE 6.0 09/07/2017 1122   GLUCOSEU 250 (A) 09/07/2017 1122   HGBUR NEGATIVE 09/07/2017 1122   BILIRUBINUR negative 02/24/2020 1550   KETONESUR negative 02/24/2020 1550   KETONESUR TRACE (A) 09/07/2017 1122   PROTEINUR 30 (A) 09/07/2017 1122   UROBILINOGEN 0.2 02/24/2020 1550   UROBILINOGEN  1.0 09/07/2017 1122   NITRITE Negative 02/24/2020 1550   NITRITE NEGATIVE 09/07/2017 1122   LEUKOCYTESUR Negative 02/24/2020 1550   Sepsis Labs Recent Labs  Lab 07/08/23 1243 07/09/23 0341  WBC 9.4 10.6*   Microbiology No results found for this or any previous visit (from the past 240 hours).   Time coordinating discharge: 35 minutes  SIGNED:   Glade Lloyd, MD  [facility] 07/10/2023, 4:44 PM

## 2023-07-10 NOTE — Progress Notes (Signed)
 Coronary CTA results discussed with Dr. Betsey Amen CAD  Ok to discharge home from cardiac standpoint. Would continue statin and start ASA 81mg  daily.   Will sign off

## 2023-07-11 ENCOUNTER — Telehealth: Payer: Self-pay

## 2023-07-11 LAB — LIPOPROTEIN A (LPA): Lipoprotein (a): 11.6 nmol/L (ref <75.0)

## 2023-07-11 NOTE — Transitions of Care (Post Inpatient/ED Visit) (Signed)
   07/11/2023  Name: Bradley Howe MRN: [id number] DOB: 1965/06/11  Today's TOC FU Call Status: Today's TOC FU Call Status:: Unsuccessful Call (1st Attempt) Unsuccessful Call (1st Attempt) Date: 07/11/23  Attempted to reach the patient regarding the most recent Inpatient/ED visit.  Follow Up Plan: Additional outreach attempts will be made to reach the patient to complete the Transitions of Care (Post Inpatient/ED visit) call.   Scott Fix A. Mliss Fritz RN, BA, [facility], CRRN [facility] RN Care Manager, Transition of Care [phone]

## 2023-07-12 ENCOUNTER — Telehealth: Payer: Self-pay

## 2023-07-12 NOTE — Transitions of Care (Post Inpatient/ED Visit) (Signed)
   07/12/2023  Name: Bradley Howe MRN: [id number] DOB: 1966-04-07  Today's TOC FU Call Status: Today's TOC FU Call Status:: Unsuccessful Call (2nd Attempt) Unsuccessful Call (2nd Attempt) Date: 07/12/23  Attempted to reach the patient regarding the most recent Inpatient/ED visit.  Follow Up Plan: Additional outreach attempts will be made to reach the patient to complete the Transitions of Care (Post Inpatient/ED visit) call.   [place] Fa[place]con A. Mliss Fritz RN, BA, [facility], CRRN [facility] RN Care Manager, Transition of Care [phone]

## 2023-07-13 ENCOUNTER — Telehealth: Payer: Self-pay | Admitting: Family Medicine

## 2023-07-13 ENCOUNTER — Telehealth: Payer: Self-pay

## 2023-07-13 NOTE — Transitions of Care (Post Inpatient/ED Visit) (Signed)
   07/13/2023  Name: Bradley Howe MRN: [id number] DOB: Apr 05, 1966  Today's TOC FU Call Status: Today's TOC FU Call Status:: Unsuccessful Call (3rd Attempt) Unsuccessful Call (3rd Attempt) Date: 07/13/23  Attempted to reach the patient regarding the most recent Inpatient/ED visit.  Follow Up Plan: No further outreach attempts will be made at this time. We have been unable to contact the patient.  Damon Baisch A. Mliss Fritz RN, BA, [facility], CRRN [facility] RN Care Manager, Transition of Care [phone]

## 2023-07-13 NOTE — Telephone Encounter (Signed)
        Paperwork in is provider box patient has appoinment on 3/10 to have paperwork filled out with provider

## 2023-07-20 ENCOUNTER — Other Ambulatory Visit: Payer: Self-pay

## 2023-07-23 ENCOUNTER — Ambulatory Visit (INDEPENDENT_AMBULATORY_CARE_PROVIDER_SITE_OTHER): Payer: Managed Care, Other (non HMO) | Admitting: Family Medicine

## 2023-07-23 ENCOUNTER — Other Ambulatory Visit: Payer: Self-pay | Admitting: Family Medicine

## 2023-07-23 ENCOUNTER — Encounter: Payer: Self-pay | Admitting: Family Medicine

## 2023-07-23 VITALS — BP 153/78 | HR 74 | Temp 98.1°F | Resp 16 | Ht 67.0 in | Wt 243.0 lb

## 2023-07-23 DIAGNOSIS — Z0289 Encounter for other administrative examinations: Secondary | ICD-10-CM

## 2023-07-23 DIAGNOSIS — Z7985 Long-term (current) use of injectable non-insulin antidiabetic drugs: Secondary | ICD-10-CM | POA: Diagnosis not present

## 2023-07-23 DIAGNOSIS — Z0000 Encounter for general adult medical examination without abnormal findings: Secondary | ICD-10-CM | POA: Diagnosis not present

## 2023-07-23 DIAGNOSIS — Z23 Encounter for immunization: Secondary | ICD-10-CM | POA: Diagnosis not present

## 2023-07-23 DIAGNOSIS — Z13220 Encounter for screening for lipoid disorders: Secondary | ICD-10-CM

## 2023-07-23 DIAGNOSIS — Z130 Encounter for screening for diseases of the blood and blood-forming organs and certain disorders involving the immune mechanism: Secondary | ICD-10-CM

## 2023-07-23 DIAGNOSIS — E1165 Type 2 diabetes mellitus with hyperglycemia: Secondary | ICD-10-CM

## 2023-07-23 DIAGNOSIS — Z794 Long term (current) use of insulin: Secondary | ICD-10-CM

## 2023-07-23 NOTE — Telephone Encounter (Signed)
 Patient came and paper work was filled out and given to him

## 2023-07-23 NOTE — Progress Notes (Unsigned)
 Established Patient Office Visit  Subjective    Patient ID: Bradley Howe, male    DOB: 12-May-1966  Age: 58 y.o. MRN: [id number]  CC: No chief complaint on file.   HPI Bradley Howe presents for routine annual exam. Patient denies acute complaints or concerns.   Outpatient Encounter Medications as of 07/23/2023  Medication Sig   aspirin EC 81 MG tablet Take 1 tablet (81 mg total) by mouth daily. Swallow whole.   Continuous Glucose Sensor (FREESTYLE LIBRE 2 SENSOR) MISC Utilize as recommended to monitor blood glucose.Change every 14 days.   Dulaglutide (TRULICITY) 1.5 MG/0.5ML SOAJ Inject 1.5 mg into the skin once a week.   gabapentin (NEURONTIN) 300 MG capsule Take 1 capsule (300 mg total) by mouth 3 (three) times daily. (Patient taking differently: Take 900 mg by mouth at bedtime.)   pantoprazole (PROTONIX) 40 MG tablet Take 1 tablet (40 mg total) by mouth daily.   rosuvastatin (CRESTOR) 20 MG tablet Take 1 tablet by mouth once daily   No facility-administered encounter medications on file as of 07/23/2023.    Past Medical History:  Diagnosis Date   GERD (gastroesophageal reflux disease)    High cholesterol    Hypertension    Type II diabetes mellitus (HCC)     Past Surgical History:  Procedure Laterality Date   INGUINAL HERNIA REPAIR Left 2013    Family History  Problem Relation Age of Onset   Hypertension Mother    Hypertension Father    Diabetes Paternal Grandmother    Heart disease Neg Hx    Colon cancer Neg Hx    Pancreatic cancer Neg Hx    Esophageal cancer Neg Hx    Stomach cancer Neg Hx    Rectal cancer Neg Hx     Social History   Socioeconomic History   Marital status: Married    Spouse name: Not on file   Number of children: Not on file   Years of education: Not on file   Highest education level: 12th grade  Occupational History   Not on file  Tobacco Use   Smoking status: Former    Current packs/day: 0.10    Average packs/day: 0.1 packs/day for  25.0 years (2.5 ttl pk-yrs)    Types: Cigarettes   Smokeless tobacco: Never   Tobacco comments:    "quit smoking cigarettes in 2013"  Vaping Use   Vaping status: Never Used  Substance and Sexual Activity   Alcohol use: Yes    Comment: occ   Drug use: No   Sexual activity: Yes  Other Topics Concern   Not on file  Social History Narrative   Not on file   Social Drivers of Health   Financial Resource Strain: Low Risk  (07/23/2023)   Overall Financial Resource Strain (CARDIA)    Difficulty of Paying Living Expenses: Not hard at all  Food Insecurity: No Food Insecurity (07/23/2023)   Hunger Vital Sign    Worried About Running Out of Food in the Last Year: Never true    Ran Out of Food in the Last Year: Never true  Transportation Needs: No Transportation Needs (07/23/2023)   PRAPARE - Administrator, Civil Service (Medical): No    Lack of Transportation (Non-Medical): No  Physical Activity: Sufficiently Active (07/23/2023)   Exercise Vital Sign    Days of Exercise per Week: 4 days    Minutes of Exercise per Session: 60 min  Stress: No Stress Concern Present (07/23/2023)  [employer] of Occupational Health - Occupational Stress Questionnaire    Feeling of Stress : Not at all  Social Connections: Unknown (07/23/2023)   Social Connection and Isolation Panel [NHANES]    Frequency of Communication with Friends and Family: Three times a week    Frequency of Social Gatherings with Friends and Family: Once a week    Attends Religious Services: Patient declined    Database administrator or Organizations: No    Attends Engineer, structural: Not on file    Marital Status: Married  Catering manager Violence: Not At Risk (07/08/2023)   Humiliation, Afraid, Rape, and Kick questionnaire    Fear of Current or Ex-Partner: No    Emotionally Abused: No    Physically Abused: No    Sexually Abused: No    Review of Systems  All other systems reviewed and are  negative.       Objective    BP (!) 153/78   Pulse 74   Temp 98.1 F (36.7 C) (Oral)   Resp 16   Ht 5\' 7"  (1.702 m)   Wt 243 lb (110.2 kg)   SpO2 93%   BMI 38.06 kg/m   Physical Exam Vitals and nursing note reviewed.  Constitutional:      General: He is not in acute distress.    Appearance: He is obese.  HENT:     Head: Normocephalic and atraumatic.     Right Ear: Tympanic membrane, ear canal and external ear normal.     Left Ear: Tympanic membrane, ear canal and external ear normal.     Nose: Nose normal.     Mouth/Throat:     Mouth: Mucous membranes are moist.     Pharynx: Oropharynx is clear.  Eyes:     Conjunctiva/sclera: Conjunctivae normal.     Pupils: Pupils are equal, round, and reactive to light.  Neck:     Thyroid: No thyromegaly.  Cardiovascular:     Rate and Rhythm: Normal rate and regular rhythm.     Heart sounds: Normal heart sounds. No murmur heard. Pulmonary:     Effort: Pulmonary effort is normal.     Breath sounds: Normal breath sounds.  Abdominal:     General: There is no distension.     Palpations: Abdomen is soft. There is no mass.     Tenderness: There is no abdominal tenderness.     Hernia: There is no hernia in the left inguinal area or right inguinal area.  Musculoskeletal:        General: Normal range of motion.     Cervical back: Normal range of motion and neck supple.     Right lower leg: No edema.     Left lower leg: No edema.  Skin:    General: Skin is warm and dry.  Neurological:     General: No focal deficit present.     Mental Status: He is alert and oriented to person, place, and time. Mental status is at baseline.  Psychiatric:        Mood and Affect: Mood normal.        Behavior: Behavior normal.         Assessment & Plan:   Annual physical exam -     CMP14+EGFR  Screening for deficiency anemia -     CBC with Differential/Platelet  Screening for lipid disorders -     Lipid panel  Type 2 diabetes mellitus  with hyperglycemia, with long-term current use  of insulin (HCC) -     Microalbumin / creatinine urine ratio  Long-term current use of injectable noninsulin antidiabetic medication  Encounter for completion of form with patient  Immunization due -     Pneumococcal conjugate vaccine 20-valent     Return in about 3 months (around 10/23/2023) for follow up, chronic med issues.   Tommie Raymond, MD

## 2023-07-23 NOTE — Progress Notes (Unsigned)
-  Patient is here to have annually  complete physical examination  -Care gap address -labs taken  Paper work fill out today

## 2023-07-24 ENCOUNTER — Encounter: Payer: Self-pay | Admitting: Family Medicine

## 2023-07-24 LAB — CMP14+EGFR
ALT: 10 IU/L (ref 0–44)
AST: 20 IU/L (ref 0–40)
Albumin: 4 g/dL (ref 3.8–4.9)
Alkaline Phosphatase: 71 IU/L (ref 44–121)
BUN/Creatinine Ratio: 10 (ref 9–20)
BUN: 10 mg/dL (ref 6–24)
Bilirubin Total: 0.3 mg/dL (ref 0.0–1.2)
CO2: 16 mmol/L — ABNORMAL LOW (ref 20–29)
Calcium: 8.6 mg/dL — ABNORMAL LOW (ref 8.7–10.2)
Chloride: 107 mmol/L — ABNORMAL HIGH (ref 96–106)
Creatinine, Ser: 0.96 mg/dL (ref 0.76–1.27)
Globulin, Total: 2.9 g/dL (ref 1.5–4.5)
Glucose: 105 mg/dL — ABNORMAL HIGH (ref 70–99)
Potassium: 3.8 mmol/L (ref 3.5–5.2)
Sodium: 140 mmol/L (ref 134–144)
Total Protein: 6.9 g/dL (ref 6.0–8.5)
eGFR: 91 mL/min/{1.73_m2} (ref >59)

## 2023-07-24 LAB — CBC WITH DIFFERENTIAL/PLATELET
Basophils Absolute: 0 10*3/uL (ref 0.0–0.2)
Basos: 0 %
EOS (ABSOLUTE): 0.2 10*3/uL (ref 0.0–0.4)
Eos: 2 %
Hematocrit: 40.3 % (ref 37.5–51.0)
Hemoglobin: 13.2 g/dL (ref 13.0–17.7)
Immature Grans (Abs): 0 10*3/uL (ref 0.0–0.1)
Immature Granulocytes: 0 %
Lymphocytes Absolute: 1.9 10*3/uL (ref 0.7–3.1)
Lymphs: 23 %
MCH: 28.4 pg (ref 26.6–33.0)
MCHC: 32.8 g/dL (ref 31.5–35.7)
MCV: 87 fL (ref 79–97)
Monocytes Absolute: 0.4 10*3/uL (ref 0.1–0.9)
Monocytes: 5 %
Neutrophils Absolute: 5.8 10*3/uL (ref 1.4–7.0)
Neutrophils: 70 %
Platelets: 265 10*3/uL (ref 150–450)
RBC: 4.65 x10E6/uL (ref 4.14–5.80)
RDW: 14.7 % (ref 11.6–15.4)
WBC: 8.3 10*3/uL (ref 3.4–10.8)

## 2023-07-24 LAB — MICROALBUMIN / CREATININE URINE RATIO
Creatinine, Urine: 332.3 mg/dL
Microalb/Creat Ratio: 9 mg/g{creat} (ref 0–29)
Microalbumin, Urine: 29.4 ug/mL

## 2023-07-24 LAB — LIPID PANEL
Chol/HDL Ratio: 4 ratio (ref 0.0–5.0)
Cholesterol, Total: 136 mg/dL (ref 100–199)
HDL: 34 mg/dL — ABNORMAL LOW (ref >39)
LDL Chol Calc (NIH): 67 mg/dL (ref 0–99)
Triglycerides: 213 mg/dL — ABNORMAL HIGH (ref 0–149)
VLDL Cholesterol Cal: 35 mg/dL (ref 5–40)

## 2023-07-30 ENCOUNTER — Telehealth: Payer: Self-pay

## 2023-07-30 ENCOUNTER — Other Ambulatory Visit: Payer: Self-pay

## 2023-07-30 NOTE — Telephone Encounter (Signed)
 Reason for CRM: pt is calling regarding his Humulin 70/30. The hospital discontinued it in Feb.  He says he needs a new prescription for it.  He needs to know if Dr. Andrey Campanile wants him to continue this .        CB#  [phone]

## 2023-07-31 ENCOUNTER — Telehealth: Payer: Self-pay | Admitting: *Deleted

## 2023-07-31 ENCOUNTER — Ambulatory Visit: Payer: Self-pay | Admitting: Family Medicine

## 2023-07-31 ENCOUNTER — Other Ambulatory Visit: Payer: Self-pay | Admitting: Family Medicine

## 2023-07-31 MED ORDER — LOSARTAN POTASSIUM 50 MG PO TABS: 50.0000 mg | ORAL_TABLET | Freq: Every day | ORAL | 0 refills | Status: AC

## 2023-07-31 MED ORDER — HUMULIN 70/30 KWIKPEN (70-30) 100 UNIT/ML ~~LOC~~ SUPN: 20.0000 [IU] | PEN_INJECTOR | Freq: Two times a day (BID) | SUBCUTANEOUS | 1 refills | Status: DC

## 2023-07-31 NOTE — Telephone Encounter (Signed)
 Source  [name] (Patient)   Subject  [name] (Patient)   Topic  Clinical - Medication Question     Communication  Reason for CRM: pt is calling regarding his Humulin 70/30. The hospital discontinued it in Feb.  He says he needs a new prescription for it.  He needs to know if Dr. Andrey Campanile wants him to continue this .        CB#  [phone]

## 2023-07-31 NOTE — Telephone Encounter (Signed)
  Chief Complaint: Patient needs refill: Humulin and Losartan  Symptoms: elevated glucose- 246- out of insulin for 2 days  Disposition: [] ED /[] Urgent Care (no appt availability in office) / [] Appointment(In office/virtual)/ []  [facility]/ [] Home Care/ [] Refused Recommended Disposition /[] [facility]/ [x]  Follow-up with PCP Additional Notes: Patient is calling to report he is unable to refill his Humulin and will need refill on Losartan soon. Patient states he was not aware that he was to stop humulin from hospital orders 07/10/23- and he has continued using. Patient states he has never stopped Humulin and only found out when his refill was denied. Patient states he has been out for 2 days and his insulin is 246 today. Patient is also concerned that the losartin is no longer listed on his current medication list- and he will need that soon. Please address his medication need. Patient notified the office is closed for lunch now but will send message for provider to review   Copied from CRM [id number]. Topic: Clinical - Red Word Triage >> Jul 31, 2023 12:10 PM Fredrich Romans wrote: Red Word that prompted transfer to Nurse Triage: out of insulin ,out 2 days Reason for Disposition  [1] Caller has URGENT medicine question about med that PCP or specialist prescribed AND [2] triager unable to answer question  Answer Assessment - Initial Assessment Questions 1. NAME of MEDICINE: "What medicine(s) are you calling about?"     Humulin refill 2. QUESTION: "What is your question?" (e.g., double dose of medicine, side effect)     Patient states he was not told by hospital provider that he should stop Humulin and so he has continued it since discharge. Patient has refills on hand. Patient was not aware of discontinuation until he tried to refill- he has been out for 2 days and his glucose is elevated to 246 today 3. PRESCRIBER: "Who prescribed the medicine?" Reason: if prescribed by specialist, call  should be referred to that group.     PCP 4. SYMPTOMS: "Do you have any symptoms?" If Yes, ask: "What symptoms are you having?"  "How bad are the symptoms (e.g., mild, moderate, severe)     Patient is using continuous monitor- he does reports he has dips during sleep- but mostly it is stable. Patient is not sure what to do now because he is completely out.  Patient states Losartan was also discontinued on his list and he has been taking that too.Patient needs to know what to do.  Protocols used: Medication Question Call-A-AH

## 2023-07-31 NOTE — Telephone Encounter (Signed)
 Source  Bradley Howe (Patient)   Subject  Bradley Howe (Patient)   Topic  Clinical - Medication Question     Communication  Reason for CRM: pt is calling regarding his Humulin 70/30. The hospital discontinued it in Feb.  He says he needs a new prescription for it.  He needs to know if Dr. Andrey Campanile wants him to continue this .        CB#  [phone]

## 2023-08-01 ENCOUNTER — Encounter: Payer: No Typology Code available for payment source | Admitting: Family Medicine

## 2023-08-01 NOTE — Telephone Encounter (Signed)
 noted

## 2023-08-01 NOTE — Telephone Encounter (Signed)
 Reason for CRM: pt is calling regarding his Humulin 70/30. The hospital discontinued it in Feb.  He says he needs a new prescription for it.  He needs to know if Dr. Andrey Campanile wants him to continue this .

## 2023-08-02 NOTE — Progress Notes (Deleted)
 Cardiology Office Note    Patient Name: Bradley Howe Date of Encounter: 08/02/2023  Primary Care Provider:  Georganna Skeans, MD Primary Cardiologist:  Armanda Magic, MD Primary Electrophysiologist: None   Past Medical History    Past Medical History:  Diagnosis Date   GERD (gastroesophageal reflux disease)    High cholesterol    Hypertension    Type II diabetes mellitus (HCC)     History of Present Illness  Bradley Howe is a 58 y.o. male with a PMH of GERD, insulin-dependent DM type II, polysubstance abuse with cocaine, HLD, HTN, who presents today for post hospital follow-up.  Bradley Howe was admitted on 07/08/2023 with complaint of chest pain and hyperglycemia.  He described the chest pain as going into his mid back and is constant.  He had troponins completed that were negative and EKG shows septal Q waves but no acute changes.  UDS that was positive for cocaine and underwent coronary CTA that showed minimal CAD.  2D echo was completed showing EF of 55 to 60% with grade 2 DD.  He was discharged on ASA and statin with instructions to follow-up with cardiology.  During today's visit the patient reports*** .  Patient denies chest pain, palpitations, dyspnea, PND, orthopnea, nausea, vomiting, dizziness, syncope, edema, weight gain, or early satiety.  ***Notes: -Last ischemic evaluation: -Last echo: -Interim ED visits: Review of Systems  Please see the history of present illness.    All other systems reviewed and are otherwise negative except as noted above.  Physical Exam    Wt Readings from Last 3 Encounters:  07/23/23 243 lb (110.2 kg)  07/10/23 240 lb 1.3 oz (108.9 kg)  02/23/23 240 lb (108.9 kg)   BM:WUXLK were no vitals filed for this visit.,There is no height or weight on file to calculate BMI. GEN: Well nourished, well developed in no acute distress Neck: No JVD; No carotid bruits Pulmonary: Clear to auscultation without rales, wheezing or rhonchi  Cardiovascular:  Normal rate. Regular rhythm. Normal S1. Normal S2.   Murmurs: There is no murmur.  ABDOMEN: Soft, non-tender, non-distended EXTREMITIES:  No edema; No deformity   EKG/LABS/ Recent Cardiac Studies   ECG personally reviewed by me today - ***  Risk Assessment/Calculations:   {Does this patient have ATRIAL FIBRILLATION?:[phone]}      Lab Results  Component Value Date   WBC 8.3 07/23/2023   HGB 13.2 07/23/2023   HCT 40.3 07/23/2023   MCV 87 07/23/2023   PLT 265 07/23/2023   Lab Results  Component Value Date   CREATININE 0.96 07/23/2023   BUN 10 07/23/2023   NA 140 07/23/2023   K 3.8 07/23/2023   CL 107 (H) 07/23/2023   CO2 16 (L) 07/23/2023   Lab Results  Component Value Date   CHOL 136 07/23/2023   HDL 34 (L) 07/23/2023   LDLCALC 67 07/23/2023   TRIG 213 (H) 07/23/2023   CHOLHDL 4.0 07/23/2023    Lab Results  Component Value Date   HGBA1C 7.3 (H) 07/09/2023   Assessment & Plan    1.  Coronary calcifications: -Coronary CTA completed showing minimal CAD -Continue***  2.  Essential hypertension  3.  Hyperlipidemia  4.  History of polysubstance abuse: -Patient's recent UDS was positive for cocaine during ED visit  5.  DM type II:      Disposition: Follow-up with Armanda Magic, MD or APP in *** months {Are you ordering a CV Procedure (e.g. stress test, cath, DCCV, TEE, etc)?  Press F2        :[id number]}   Signed, Napoleon Form, Leodis Rains, NP 08/02/2023, 5:48 PM [facility]

## 2023-08-03 ENCOUNTER — Ambulatory Visit: Payer: No Typology Code available for payment source | Attending: Nurse Practitioner | Admitting: Nurse Practitioner

## 2023-08-03 DIAGNOSIS — Z8679 Personal history of other diseases of the circulatory system: Secondary | ICD-10-CM

## 2023-08-03 DIAGNOSIS — Z794 Long term (current) use of insulin: Secondary | ICD-10-CM

## 2023-08-03 DIAGNOSIS — E785 Hyperlipidemia, unspecified: Secondary | ICD-10-CM

## 2023-08-03 DIAGNOSIS — F1910 Other psychoactive substance abuse, uncomplicated: Secondary | ICD-10-CM

## 2023-08-03 DIAGNOSIS — I10 Essential (primary) hypertension: Secondary | ICD-10-CM

## 2023-08-06 ENCOUNTER — Telehealth: Admitting: Family Medicine

## 2023-08-06 ENCOUNTER — Encounter: Payer: Self-pay | Admitting: Family Medicine

## 2023-08-06 DIAGNOSIS — M5441 Lumbago with sciatica, right side: Secondary | ICD-10-CM | POA: Diagnosis not present

## 2023-08-06 MED ORDER — CYCLOBENZAPRINE HCL 10 MG PO TABS: 5.0000 mg | ORAL_TABLET | Freq: Two times a day (BID) | ORAL | 0 refills | Status: DC | PRN

## 2023-08-06 MED ORDER — PREDNISONE 10 MG (21) PO TBPK: ORAL_TABLET | ORAL | 0 refills | Status: DC

## 2023-08-06 NOTE — Progress Notes (Signed)
 Virtual Visit Consent   Bradley Howe, you are scheduled for a virtual visit with a [facility] provider today. Just as with appointments in the office, your consent must be obtained to participate. Your consent will be active for this visit and any virtual visit you may have with one of our providers in the next 365 days. If you have a MyChart account, a copy of this consent can be sent to you electronically.  As this is a virtual visit, video technology does not allow for your provider to perform a traditional examination. This may limit your provider's ability to fully assess your condition. If your provider identifies any concerns that need to be evaluated in person or the need to arrange testing (such as labs, EKG, etc.), we will make arrangements to do so. Although advances in technology are sophisticated, we cannot ensure that it will always work on either your end or our end. If the connection with a video visit is poor, the visit may have to be switched to a telephone visit. With either a video or telephone visit, we are not always able to ensure that we have a secure connection.  By engaging in this virtual visit, you consent to the provision of healthcare and authorize for your insurance to be billed (if applicable) for the services provided during this visit. Depending on your insurance coverage, you may receive a charge related to this service.  I need to obtain your verbal consent now. Are you willing to proceed with your visit today? Dodger Sinning has provided verbal consent on 08/06/2023 for a virtual visit (video or telephone). Freddy Finner, NP  Date: 08/06/2023 11:31 AM   Virtual Visit via Video Note   I, Freddy Finner, connected with  Bradley Howe  ([id number], 1965-05-24) on 08/06/23 at 11:30 AM EDT by a video-enabled telemedicine application and verified that I am speaking with the correct person using two identifiers.  Location: Patient: Virtual Visit Location Patient:  Home Provider: Virtual Visit Location Provider: Home Office   I discussed the limitations of evaluation and management by telemedicine and the availability of in person appointments. The patient expressed understanding and agreed to proceed.    History of Present Illness: Bradley Howe is a 58 y.o. who identifies as a male who was assigned male at birth, and is being seen today for back pain  Onset of back pain for 3 days ago got worse, but has had it in past. Injury or trauma related, lifts heavy for work.  This pain is located in the lower back straight across with radiation into the right buttock and posterior thigh.  Pain score today 5/10  Hurts most when resting and laying, standing still is the worse. It has started to disturb sleep and limiting movement. It is relieved by sitting up Modifying factors have included ibuprofen some help, brief  Has used muscle relaxer in the past- at times when he could take it.   There is no associated lower extremity numbness or weakness. There is no associated incontinence of stool or urine.   Problems:  Patient Active Problem List   Diagnosis Date Noted   Hypoglycemia due to type 2 diabetes mellitus (HCC) 07/08/2023   History of cocaine abuse (HCC) 07/08/2023   Erectile dysfunction 04/21/2021   Obesity, unspecified 08/20/2019   Ex-smoker 08/20/2019   GERD (gastroesophageal reflux disease) 08/20/2019   Elevated LFTs 08/20/2019   Angina pectoris (HCC)    Chest pain 09/17/2015   Type 2 diabetes  mellitus with hyperglycemia, with long-term current use of insulin (HCC) 01/15/2013   Hypercholesterolemia 01/15/2013   Renal insufficiency 07/26/2012   Hyponatremia 07/26/2012   Essential (primary) hypertension 07/26/2012   Left inguinal hernia 05/19/2011    Allergies:  Allergies  Allergen Reactions   Metformin And Related Diarrhea   Medications:  Current Outpatient Medications:    aspirin EC 81 MG tablet, Take 1 tablet (81 mg total) by  mouth daily. Swallow whole., Disp: 30 tablet, Rfl: 0   Continuous Glucose Sensor (FREESTYLE LIBRE 2 SENSOR) MISC, Utilize as recommended to monitor blood glucose.Change every 14 days., Disp: 6 each, Rfl: 1   gabapentin (NEURONTIN) 300 MG capsule, Take 1 capsule (300 mg total) by mouth 3 (three) times daily. (Patient taking differently: Take 900 mg by mouth at bedtime.), Disp: 270 capsule, Rfl: 0   insulin isophane & regular human KwikPen (HUMULIN 70/30 KWIKPEN) (70-30) 100 UNIT/ML KwikPen, Inject 20 Units into the skin 2 (two) times daily with a meal., Disp: 15 mL, Rfl: 1   losartan (COZAAR) 50 MG tablet, Take 1 tablet (50 mg total) by mouth daily., Disp: 90 tablet, Rfl: 0   pantoprazole (PROTONIX) 40 MG tablet, Take 1 tablet (40 mg total) by mouth daily., Disp: 30 tablet, Rfl: 0   rosuvastatin (CRESTOR) 20 MG tablet, Take 1 tablet by mouth once daily, Disp: 90 tablet, Rfl: 0   TRULICITY 1.5 MG/0.5ML SOAJ, INJECT 1.5 MG SUBCUTANEOUSLY ONCE A WEEK, Disp: 4 mL, Rfl: 0  Observations/Objective: Patient is well-developed, well-nourished in no acute distress.  Resting comfortably  at home.  Head is normocephalic, atraumatic.  No labored breathing.  Speech is clear and coherent with logical content.  Patient is alert and oriented at baseline.    Assessment and Plan:    1. Right-sided low back pain with right-sided sciatica, unspecified chronicity (Primary)  - predniSONE (STERAPRED UNI-PAK 21 TAB) 10 MG (21) TBPK tablet; Take as directed  Dispense: 21 tablet; Refill: 0 - cyclobenzaprine (FLEXERIL) 10 MG tablet; Take 0.5 tablets (5 mg total) by mouth 2 (two) times daily as needed for muscle spasms.  Dispense: 30 tablet; Refill: 0  -back pain info on AVS -no red flags today -follow up in person, reviewed strict inperson needs or signs -work note for today   Reviewed side effects, risks and benefits of medication.    Patient acknowledged agreement and understanding of the plan.   Past  Medical, Surgical, Social History, Allergies, and Medications have been Reviewed.     Follow Up Instructions: I discussed the assessment and treatment plan with the patient. The patient was provided an opportunity to ask questions and all were answered. The patient agreed with the plan and demonstrated an understanding of the instructions.  A copy of instructions were sent to the patient via MyChart unless otherwise noted below.    The patient was advised to call back or seek an in-person evaluation if the symptoms worsen or if the condition fails to improve as anticipated.    Freddy Finner, NP

## 2023-08-06 NOTE — Patient Instructions (Addendum)
 Bradley Howe, thank you for joining Freddy Finner, NP for today's virtual visit.  While this provider is not your primary care provider (PCP), if your PCP is located in our provider database this encounter information will be shared with them immediately following your visit.   A [facility] MyChart account gives you access to today's visit and all your visits, tests, and labs performed at [facility] " click here if you don't have a [facility] MyChart account or go to [email or web address]  Consent: (Patient) Bradley Howe provided verbal consent for this virtual visit at the beginning of the encounter.  Current Medications:  Current Outpatient Medications:    cyclobenzaprine (FLEXERIL) 10 MG tablet, Take 0.5 tablets (5 mg total) by mouth 2 (two) times daily as needed for muscle spasms., Disp: 30 tablet, Rfl: 0   predniSONE (STERAPRED UNI-PAK 21 TAB) 10 MG (21) TBPK tablet, Take as directed, Disp: 21 tablet, Rfl: 0   aspirin EC 81 MG tablet, Take 1 tablet (81 mg total) by mouth daily. Swallow whole., Disp: 30 tablet, Rfl: 0   Continuous Glucose Sensor (FREESTYLE LIBRE 2 SENSOR) MISC, Utilize as recommended to monitor blood glucose.Change every 14 days., Disp: 6 each, Rfl: 1   gabapentin (NEURONTIN) 300 MG capsule, Take 1 capsule (300 mg total) by mouth 3 (three) times daily. (Patient taking differently: Take 900 mg by mouth at bedtime.), Disp: 270 capsule, Rfl: 0   insulin isophane & regular human KwikPen (HUMULIN 70/30 KWIKPEN) (70-30) 100 UNIT/ML KwikPen, Inject 20 Units into the skin 2 (two) times daily with a meal., Disp: 15 mL, Rfl: 1   losartan (COZAAR) 50 MG tablet, Take 1 tablet (50 mg total) by mouth daily., Disp: 90 tablet, Rfl: 0   pantoprazole (PROTONIX) 40 MG tablet, Take 1 tablet (40 mg total) by mouth daily., Disp: 30 tablet, Rfl: 0   rosuvastatin (CRESTOR) 20 MG tablet, Take 1 tablet by mouth once daily, Disp: 90 tablet, Rfl: 0   TRULICITY 1.5 MG/0.5ML SOAJ,  INJECT 1.5 MG SUBCUTANEOUSLY ONCE A WEEK, Disp: 4 mL, Rfl: 0   Medications ordered in this encounter:  Meds ordered this encounter  Medications   predniSONE (STERAPRED UNI-PAK 21 TAB) 10 MG (21) TBPK tablet    Sig: Take as directed    Dispense:  21 tablet    Refill:  0    Supervising Provider:   Merrilee Jansky [[id number]]   cyclobenzaprine (FLEXERIL) 10 MG tablet    Sig: Take 0.5 tablets (5 mg total) by mouth 2 (two) times daily as needed for muscle spasms.    Dispense:  30 tablet    Refill:  0    Supervising Provider:   Merrilee Jansky [[id number]]     *If you need refills on other medications prior to your next appointment, please contact your pharmacy*  Follow-Up: Call back or seek an in-person evaluation if the symptoms worsen or if the condition fails to improve as anticipated.  [facility] [phone]  Other Instructions Acute Back Pain, Adult Acute back pain is sudden and usually short-lived. It is often caused by an injury to the muscles and tissues in the back. The injury may result from: A muscle, tendon, or ligament getting overstretched or torn. Ligaments are tissues that connect bones to each other. Lifting something improperly can cause a back strain. Wear and tear (degeneration) of the spinal disks. Spinal disks are circular tissue that provide cushioning between the bones of the spine (vertebrae). Twisting motions,  such as while playing sports or doing yard work. A hit to the back. Arthritis. You may have a physical exam, lab tests, and imaging tests to find the cause of your pain. Acute back pain usually goes away with rest and home care. Follow these instructions at home: Managing pain, stiffness, and swelling Take over-the-counter and prescription medicines only as told by your health care provider. Treatment may include medicines for pain and inflammation that are taken by mouth or applied to the skin, or muscle relaxants. Your health care  provider may recommend applying ice during the first 24-48 hours after your pain starts. To do this: Put ice in a plastic bag. Place a towel between your skin and the bag. Leave the ice on for 20 minutes, 2-3 times a day. Remove the ice if your skin turns bright red. This is very important. If you cannot feel pain, heat, or cold, you have a greater risk of damage to the area. If directed, apply heat to the affected area as often as told by your health care provider. Use the heat source that your health care provider recommends, such as a moist heat pack or a heating pad. Place a towel between your skin and the heat source. Leave the heat on for 20-30 minutes. Remove the heat if your skin turns bright red. This is especially important if you are unable to feel pain, heat, or cold. You have a greater risk of getting burned. Activity  Do not stay in bed. Staying in bed for more than 1-2 days can delay your recovery. Sit up and stand up straight. Avoid leaning forward when you sit or hunching over when you stand. If you work at a desk, sit close to it so you do not need to lean over. Keep your chin tucked in. Keep your neck drawn back, and keep your elbows bent at a 90-degree angle (right angle). Sit high and close to the steering wheel when you drive. Add lower back (lumbar) support to your car seat, if needed. Take short walks on even surfaces as soon as you are able. Try to increase the length of time you walk each day. Do not sit, drive, or stand in one place for more than 30 minutes at a time. Sitting or standing for long periods of time can put stress on your back. Do not drive or use heavy machinery while taking prescription pain medicine. Use proper lifting techniques. When you bend and lift, use positions that put less stress on your back: [place] your knees. Keep the load close to your body. Avoid twisting. Exercise regularly as told by your health care provider. Exercising helps your back  heal faster and helps prevent back injuries by keeping muscles strong and flexible. Work with a physical therapist to make a safe exercise program, as recommended by your health care provider. Do any exercises as told by your physical therapist. Lifestyle Maintain a healthy weight. Extra weight puts stress on your back and makes it difficult to have good posture. Avoid activities or situations that make you feel anxious or stressed. Stress and anxiety increase muscle tension and can make back pain worse. Learn ways to manage anxiety and stress, such as through exercise. General instructions Sleep on a firm mattress in a comfortable position. Try lying on your side with your knees slightly bent. If you lie on your back, put a pillow under your knees. Keep your head and neck in a straight line with your spine (neutral position)  when using electronic equipment like smartphones or pads. To do this: Raise your smartphone or pad to look at it instead of bending your head or neck to look down. Put the smartphone or pad at the level of your face while looking at the screen. Follow your treatment plan as told by your health care provider. This may include: Cognitive or behavioral therapy. Acupuncture or massage therapy. Meditation or yoga. Contact a health care provider if: You have pain that is not relieved with rest or medicine. You have increasing pain going down into your legs or buttocks. Your pain does not improve after 2 weeks. You have pain at night. You lose weight without trying. You have a fever or chills. You develop nausea or vomiting. You develop abdominal pain. Get help right away if: You develop new bowel or bladder control problems. You have unusual weakness or numbness in your arms or legs. You feel faint. These symptoms may represent a serious problem that is an emergency. Do not wait to see if the symptoms will go away. Get medical help right away. Call your local emergency  services (911 in the [place] Do not drive yourself to the hospital. Summary Acute back pain is sudden and usually short-lived. Use proper lifting techniques. When you bend and lift, use positions that put less stress on your back. Take over-the-counter and prescription medicines only as told by your health care provider, and apply heat or ice as told. This information is not intended to replace advice given to you by your health care provider. Make sure you discuss any questions you have with your health care provider. Document Revised: 07/23/2020 Document Reviewed: 07/23/2020 Elsevier Patient Education  2024 Elsevier Inc.   If you have been instructed to have an in-person evaluation today at a local Urgent Care facility, please use the link below. It will take you to a list of all of our available [facility], including address, phone number and hours of operation. Please do not delay care.  [facility]  If you or a family member do not have a primary care provider, use the link below to schedule a visit and establish care. When you choose a [facility] primary care physician or advanced practice provider, you gain a long-term partner in health. Find a Primary Care Provider  Learn more about [facility] in-office and virtual care options: [facility] - Get Care Now

## 2023-08-18 ENCOUNTER — Other Ambulatory Visit: Payer: Self-pay | Admitting: Family Medicine

## 2023-08-18 DIAGNOSIS — E1165 Type 2 diabetes mellitus with hyperglycemia: Secondary | ICD-10-CM

## 2023-08-20 ENCOUNTER — Telehealth: Payer: Self-pay

## 2023-08-20 NOTE — Telephone Encounter (Signed)
 Copied from CRM [id number]. Topic: Clinical - Medication Question >> Aug 20, 2023  2:01 PM Yolanda T wrote: Reason for CRM: patient called to f/u on the refill for TRULICITY 1.5 MG/0.5ML SOAJ [Pharmacy Med Name: Trulicity 1.5 MG/0.5ML Subcutaneous Solution Pen-injector. Patient wants to know if he could get refills instead of calling every month

## 2023-08-22 ENCOUNTER — Other Ambulatory Visit: Payer: Self-pay | Admitting: Family Medicine

## 2023-08-22 DIAGNOSIS — E1165 Type 2 diabetes mellitus with hyperglycemia: Secondary | ICD-10-CM

## 2023-08-24 ENCOUNTER — Other Ambulatory Visit: Payer: Self-pay | Admitting: Family Medicine

## 2023-08-24 DIAGNOSIS — E785 Hyperlipidemia, unspecified: Secondary | ICD-10-CM

## 2023-08-24 NOTE — Telephone Encounter (Signed)
 Requested Prescriptions  Pending Prescriptions Disp Refills   rosuvastatin (CRESTOR) 20 MG tablet [Pharmacy Med Name: Rosuvastatin Calcium 20 MG Oral Tablet] 90 tablet 0    Sig: Take 1 tablet by mouth once daily     Cardiovascular:  Antilipid - Statins 2 Failed - 08/24/2023  2:19 PM      Failed - Lipid Panel in normal range within the last 12 months    Cholesterol, Total  Date Value Ref Range Status  07/23/2023 136 100 - 199 mg/dL Final   LDL Chol Calc (NIH)  Date Value Ref Range Status  07/23/2023 67 0 - 99 mg/dL Final   HDL  Date Value Ref Range Status  07/23/2023 34 (L) >39 mg/dL Final   Triglycerides  Date Value Ref Range Status  07/23/2023 213 (H) 0 - 149 mg/dL Final         Passed - Cr in normal range and within 360 days    Creat  Date Value Ref Range Status  01/15/2013 1.36 (H) 0.50 - 1.35 mg/dL Final   Creatinine, Ser  Date Value Ref Range Status  07/23/2023 0.96 0.76 - 1.27 mg/dL Final         Passed - Patient is not pregnant      Passed - Valid encounter within last 12 months    Recent Outpatient Visits           1 month ago Annual physical exam   [facility], Lauris Poag, MD   6 months ago Type 2 diabetes mellitus with hyperglycemia, with long-term current use of insulin (HCC)   [facility], Lauris Poag, MD   9 months ago Type 2 diabetes mellitus with hyperglycemia, with long-term current use of insulin (HCC)   [facility], Amy J, NP   12 months ago Type 2 diabetes mellitus with diabetic neuropathy, with long-term current use of insulin (HCC)   [facility], Lauris Poag, MD   1 year ago Type 2 diabetes mellitus with diabetic neuropathy, with long-term current use of insulin [facility])   [facility], MD

## 2023-08-27 ENCOUNTER — Other Ambulatory Visit: Payer: Self-pay

## 2023-09-03 NOTE — Telephone Encounter (Signed)
 Patient said he got his medication and he is ok

## 2023-09-12 MED ORDER — TRULICITY 1.5 MG/0.5ML ~~LOC~~ SOAJ
1.5000 mg | SUBCUTANEOUS | 0 refills | Status: DC
Start: 2023-09-12 — End: 2023-10-22

## 2023-09-14 ENCOUNTER — Other Ambulatory Visit: Payer: Self-pay | Admitting: Family Medicine

## 2023-09-14 DIAGNOSIS — Z794 Long term (current) use of insulin: Secondary | ICD-10-CM

## 2023-09-21 ENCOUNTER — Telehealth: Payer: Self-pay

## 2023-09-21 ENCOUNTER — Encounter: Payer: Self-pay | Admitting: Internal Medicine

## 2023-09-21 NOTE — Telephone Encounter (Signed)
 Patient rescheduled.

## 2023-09-25 ENCOUNTER — Telehealth: Admitting: Physician Assistant

## 2023-09-25 DIAGNOSIS — K047 Periapical abscess without sinus: Secondary | ICD-10-CM | POA: Diagnosis not present

## 2023-09-25 MED ORDER — AMOXICILLIN-POT CLAVULANATE 875-125 MG PO TABS: 1.0000 | ORAL_TABLET | Freq: Two times a day (BID) | ORAL | 0 refills | Status: DC

## 2023-09-25 MED ORDER — NAPROXEN 500 MG PO TABS: 500.0000 mg | ORAL_TABLET | Freq: Two times a day (BID) | ORAL | 0 refills | Status: DC

## 2023-09-25 NOTE — Patient Instructions (Signed)
 Bradley Howe, thank you for joining Hyla Maillard, PA-C for today's virtual visit.  While this provider is not your primary care provider (PCP), if your PCP is located in our provider database this encounter information will be shared with them immediately following your visit.   A [facility] MyChart account gives you access to today's visit and all your visits, tests, and labs performed at [facility] " click here if you don't have a [facility] MyChart account or go to [email or web address]  Consent: (Patient) Bradley Howe provided verbal consent for this virtual visit at the beginning of the encounter.  Current Medications:  Current Outpatient Medications:    Continuous Glucose Sensor (FREESTYLE LIBRE 2 SENSOR) MISC, Utilize as recommended to monitor blood glucose.Change every 14 days., Disp: 6 each, Rfl: 1   cyclobenzaprine  (FLEXERIL ) 10 MG tablet, Take 0.5 tablets (5 mg total) by mouth 2 (two) times daily as needed for muscle spasms., Disp: 30 tablet, Rfl: 0   Dulaglutide  (TRULICITY ) 1.5 MG/0.5ML SOAJ, Inject 1.5 mg into the skin once a week., Disp: 4 mL, Rfl: 0   gabapentin  (NEURONTIN ) 300 MG capsule, Take 1 capsule (300 mg total) by mouth 3 (three) times daily. (Patient taking differently: Take 900 mg by mouth at bedtime.), Disp: 270 capsule, Rfl: 0   insulin  isophane & regular human KwikPen (HUMULIN  70/30 KWIKPEN) (70-30) 100 UNIT/ML KwikPen, Inject 20 Units into the skin 2 (two) times daily with a meal., Disp: 15 mL, Rfl: 1   losartan  (COZAAR ) 50 MG tablet, Take 1 tablet (50 mg total) by mouth daily., Disp: 90 tablet, Rfl: 0   pantoprazole  (PROTONIX ) 40 MG tablet, Take 1 tablet (40 mg total) by mouth daily., Disp: 30 tablet, Rfl: 0   predniSONE  (STERAPRED UNI-PAK 21 TAB) 10 MG (21) TBPK tablet, Take as directed, Disp: 21 tablet, Rfl: 0   rosuvastatin  (CRESTOR ) 20 MG tablet, Take 1 tablet by mouth once daily, Disp: 90 tablet, Rfl: 0   TRULICITY  1.5 MG/0.5ML SOAJ,  INJECT 1.5 MG SUBCUTANEOUSLY ONCE A WEEK, Disp: 4 mL, Rfl: 0   Medications ordered in this encounter:  No orders of the defined types were placed in this encounter.    *If you need refills on other medications prior to your next appointment, please contact your pharmacy*  Follow-Up: Call back or seek an in-person evaluation if the symptoms worsen or if the condition fails to improve as anticipated.  [facility] [phone]  Other Instructions Please continue good oral hygiene. Avoid chewing on the affected side. Please take the Augmentin  and Naprosyn  as directed. Tylenol  OTC if needed. Please call your dental provider fist thing tomorrow for a follow-up.  Dental Pain: What It Means  Dental pain is often a sign that something is wrong with your teeth or gums. You can also have pain after a dental treatment. If you have dental pain, it's important to contact your dental care provider, especially if you don't know what's causing the pain.  Dental pain may hurt a lot or a little. It can be caused by many things, including: Tooth decay. This is also called cavities or caries. Infection. An abscess. This is when the inner part of the tooth is filled with pus. An injury or a crack in the tooth. Gum disease or gums that move back and expose the root of a tooth. Abnormal grinding or clenching of teeth. Not taking good care of your teeth. Sometimes the cause of pain isn't known. You may have pain  all the time, or it may come and go. It may happen only when you're: Chewing. Exposed to hot or cold temperatures. Eating or drinking foods or drinks with a lot of sugar in them, such as soda or candy. Follow these instructions at home: Medicines Take your medicines only as told. If you were given antibiotics, take them as told. Do not stop taking them even if you start to feel better. Eating and drinking Avoid foods or drinks that cause you pain. These may include: Very hot  or very cold foods or drinks. Sweet or sugary foods or drinks. Managing pain and swelling  Use ice or an ice pack on the painful area of your face as told. Put ice in a plastic bag. Place a towel between your skin and the ice. Leave the ice on for 20 minutes, 2-3 times a day. Remove the ice if your skin turns bright red. This is very important. If you cannot feel pain, heat, or cold, you have a greater risk of damage to the area. If your skin turns red, take off the ice right away to prevent skin damage. The risk of damage is higher if you can't feel pain, heat, or cold. Brushing and flossing Brush your teeth twice a day using a fluoride toothpaste. Use a toothpaste made for sensitive teeth as told by your dental care provider. Always use a soft toothbrush. This will help prevent irritation of your gums. Floss your teeth at least once a day. General instructions Do not apply heat to the outside of your face. To cleanse your mouth and help with any irritation and swelling in the painful area, you can try rinsing with salt water. Swish and gargle with salt water and then spit it out. To make salt water, add a half to a whole spoonful of salt to a glass of warm water. Mix well. You can repeat this every 2-3 hours for pain. Contact a dental care provider if: You have dental pain and you don't know why. Your pain isn't controlled with medicines. Your symptoms get worse. You have new symptoms. Get help right away if: You can't open your mouth. You're having trouble breathing or swallowing. You have a fever. Your face, neck, or jaw is swollen. These symptoms may be an emergency. Call 911 right away. Do not wait to see if the symptoms will go away. Do not drive yourself to the hospital. This information is not intended to replace advice given to you by your health care provider. Make sure you discuss any questions you have with your health care provider. Document Revised: 03/13/2023  Document Reviewed: 09/28/2022 Elsevier Patient Education  2024 Elsevier Inc.   If you have been instructed to have an in-person evaluation today at a local Urgent Care facility, please use the link below. It will take you to a list of all of our available [facility], including address, phone number and hours of operation. Please do not delay care.  [facility]  If you or a family member do not have a primary care provider, use the link below to schedule a visit and establish care. When you choose a [facility] primary care physician or advanced practice provider, you gain a long-term partner in health. Find a Primary Care Provider  Learn more about [facility]'s in-office and virtual care options: [place] - Get Care Now

## 2023-09-25 NOTE — Progress Notes (Signed)
 Virtual Visit Consent   Bradley Howe, you are scheduled for a virtual visit with a [facility] provider today. Just as with appointments in the office, your consent must be obtained to participate. Your consent will be active for this visit and any virtual visit you may have with one of our providers in the next 365 days. If you have a MyChart account, a copy of this consent can be sent to you electronically.  As this is a virtual visit, video technology does not allow for your provider to perform a traditional examination. This may limit your provider's ability to fully assess your condition. If your provider identifies any concerns that need to be evaluated in person or the need to arrange testing (such as labs, EKG, etc.), we will make arrangements to do so. Although advances in technology are sophisticated, we cannot ensure that it will always work on either your end or our end. If the connection with a video visit is poor, the visit may have to be switched to a telephone visit. With either a video or telephone visit, we are not always able to ensure that we have a secure connection.  By engaging in this virtual visit, you consent to the provision of healthcare and authorize for your insurance to be billed (if applicable) for the services provided during this visit. Depending on your insurance coverage, you may receive a charge related to this service.  I need to obtain your verbal consent now. Are you willing to proceed with your visit today? Bradley Howe has provided verbal consent on 09/25/2023 for a virtual visit (video or telephone). Bradley Howe, [place]  Date: 09/25/2023 6:15 PM   Virtual Visit via Video Note   I, Bradley Howe, connected with  Bradley Howe  ([id number], Jan 29, 1966) on 09/25/23 at  6:15 PM EDT by a video-enabled telemedicine application and verified that I am speaking with the correct person using two identifiers.  Location: Patient: Virtual Visit Location  Patient: Home Provider: Virtual Visit Location Provider: Home Office   I discussed the limitations of evaluation and management by telemedicine and the availability of in person appointments. The patient expressed understanding and agreed to proceed.    History of Present Illness: Bradley Howe is a 58 y.o. who identifies as a male who was assigned male at birth, and is being seen today for possible dental infection. Endorses symptoms starting Sunday night with pain in R upper back around the gums. Notes pain is 8/10. Has tried OTC Ibuprofen  with limited response. Notes is in area of a pulled tooth. Able to fully open and close the jaw despite pain.    HPI: HPI  Problems:  Patient Active Problem List   Diagnosis Date Noted   Hypoglycemia due to type 2 diabetes mellitus (HCC) 07/08/2023   History of cocaine abuse (HCC) 07/08/2023   Erectile dysfunction 04/21/2021   Obesity, unspecified 08/20/2019   Ex-smoker 08/20/2019   GERD (gastroesophageal reflux disease) 08/20/2019   Elevated LFTs 08/20/2019   Angina pectoris (HCC)    Chest pain 09/17/2015   Type 2 diabetes mellitus with hyperglycemia, with long-term current use of insulin  (HCC) 01/15/2013   Hypercholesterolemia 01/15/2013   Renal insufficiency 07/26/2012   Hyponatremia 07/26/2012   Essential (primary) hypertension 07/26/2012   Left inguinal hernia 05/19/2011    Allergies:  Allergies  Allergen Reactions   Metformin  And Related Diarrhea   Medications:  Current Outpatient Medications:    amoxicillin -clavulanate (AUGMENTIN ) 875-125 MG tablet, Take 1 tablet by mouth  2 (two) times daily., Disp: 14 tablet, Rfl: 0   naproxen  (NAPROSYN ) 500 MG tablet, Take 1 tablet (500 mg total) by mouth 2 (two) times daily with a meal., Disp: 20 tablet, Rfl: 0   Continuous Glucose Sensor (FREESTYLE LIBRE 2 SENSOR) MISC, Utilize as recommended to monitor blood glucose.Change every 14 days., Disp: 6 each, Rfl: 1   Dulaglutide  (TRULICITY ) 1.5  MG/0.5ML SOAJ, Inject 1.5 mg into the skin once a week., Disp: 4 mL, Rfl: 0   gabapentin  (NEURONTIN ) 300 MG capsule, Take 1 capsule (300 mg total) by mouth 3 (three) times daily. (Patient taking differently: Take 900 mg by mouth at bedtime.), Disp: 270 capsule, Rfl: 0   insulin  isophane & regular human KwikPen (HUMULIN  70/30 KWIKPEN) (70-30) 100 UNIT/ML KwikPen, Inject 20 Units into the skin 2 (two) times daily with a meal., Disp: 15 mL, Rfl: 1   losartan  (COZAAR ) 50 MG tablet, Take 1 tablet (50 mg total) by mouth daily., Disp: 90 tablet, Rfl: 0   pantoprazole  (PROTONIX ) 40 MG tablet, Take 1 tablet (40 mg total) by mouth daily., Disp: 30 tablet, Rfl: 0   rosuvastatin  (CRESTOR ) 20 MG tablet, Take 1 tablet by mouth once daily, Disp: 90 tablet, Rfl: 0   TRULICITY  1.5 MG/0.5ML SOAJ, INJECT 1.5 MG SUBCUTANEOUSLY ONCE A WEEK, Disp: 4 mL, Rfl: 0  Observations/Objective: Patient is well-developed, well-nourished in no acute distress.  Resting comfortably at home.  Head is normocephalic, atraumatic.  No labored breathing.  Speech is clear and coherent with logical content.  Patient is alert and oriented at baseline.   Assessment and Plan: 1. Dental infection (Primary) - amoxicillin -clavulanate (AUGMENTIN ) 875-125 MG tablet; Take 1 tablet by mouth 2 (two) times daily.  Dispense: 14 tablet; Refill: 0 - naproxen  (NAPROSYN ) 500 MG tablet; Take 1 tablet (500 mg total) by mouth 2 (two) times daily with a meal.  Dispense: 20 tablet; Refill: 0  Supportive measures and OTC medications reviewed. Augmentin  and Naprosyn  per orders. He is to call dental provide  Follow Up Instructions: I discussed the assessment and treatment plan with the patient. The patient was provided an opportunity to ask questions and all were answered. The patient agreed with the plan and demonstrated an understanding of the instructions.  A copy of instructions were sent to the patient via MyChart unless otherwise noted below.   The  patient was advised to call back or seek an in-person evaluation if the symptoms worsen or if the condition fails to improve as anticipated.    Bradley Maillard, PA-C

## 2023-10-01 ENCOUNTER — Telehealth: Payer: Self-pay

## 2023-10-01 ENCOUNTER — Other Ambulatory Visit: Payer: Self-pay | Admitting: Family Medicine

## 2023-10-01 NOTE — Telephone Encounter (Signed)
 Copied from CRM [id number]. Topic: Clinical - Medication Refill >> Oct 01, 2023 11:09 AM Corin V wrote: Medication: insulin  isophane & regular human KwikPen (HUMULIN  70/30 KWIKPEN) (70-30) 100 UNIT/ML KwikPen  Has the patient contacted their pharmacy? Yes (Agent: If no, request that the patient contact the pharmacy for the refill. If patient does not wish to contact the pharmacy document the reason why and proceed with request.) (Agent: If yes, when and what did the pharmacy advise?)  This is the patient's preferred pharmacy:  [facility] [address] [place] - [address] [place] [address] Phone: [phone] Fax: [phone]  Is this the correct pharmacy for this prescription? Yes If no, delete pharmacy and type the correct one.   Has the prescription been filled recently? No  Is the patient out of the medication? Yes  Has the patient been seen for an appointment in the last year OR does the patient have an upcoming appointment? Yes  Can we respond through MyChart? Yes  Agent: Please be advised that Rx refills may take up to 3 business days. We ask that you follow-up with your pharmacy.

## 2023-10-01 NOTE — Telephone Encounter (Signed)
 Copied from CRM [id number]. Topic: Clinical - Medication Question >> Oct 01, 2023 12:32 PM Georgeann Kindred wrote: Reason for CRM: Patient called requesting an increase on his HUMULIN  70/30 KWIKPEN (70-30) 100 UNIT/ML KwikPen. Patient stated that his glucose levels are still high due to only taking 20 units at a time. Please contact the patient at [phone] for additional information.

## 2023-10-03 NOTE — Telephone Encounter (Signed)
 Attempt to call patient back.  Left message on voicemail to return call.

## 2023-10-05 ENCOUNTER — Encounter: Payer: No Typology Code available for payment source | Admitting: Internal Medicine

## 2023-10-11 ENCOUNTER — Other Ambulatory Visit: Payer: Self-pay | Admitting: Family Medicine

## 2023-10-11 NOTE — Telephone Encounter (Signed)
 Copied from CRM [id number]. Topic: Clinical - Medication Refill >> Oct 11, 2023 10:05 AM Bearl Botts E wrote: Medication: Continuous Glucose Sensor (FREESTYLE LIBRE PRO) or whatever the latest version is.   Has the patient contacted their pharmacy? Yes.  (Agent: If no, request that the patient contact the pharmacy for the refill. If patient does not wish to contact the pharmacy document the reason why and proceed with request.) (Agent: If yes, when and what did the pharmacy advise?)  This is the patient's preferred pharmacy:  [facility] [address], [place] - [address] [place] [address] Phone: [phone] Fax: [phone]  Is this the correct pharmacy for this prescription? Yes If no, delete pharmacy and type the correct one.   Has the prescription been filled recently? Yes  Is the patient out of the medication? Yes  Has the patient been seen for an appointment in the last year OR does the patient have an upcoming appointment? Yes  Can we respond through MyChart? Yes  Agent: Please be advised that Rx refills may take up to 3 business days. We ask that you follow-up with your pharmacy.

## 2023-10-11 NOTE — Telephone Encounter (Signed)
 Pt returned call requesting to speak to the clinic, please advise.   Best contact: [phone]

## 2023-10-12 MED ORDER — FREESTYLE LIBRE 2 SENSOR MISC: 3 refills | Status: DC

## 2023-10-12 NOTE — Telephone Encounter (Signed)
 Requested Prescriptions  Pending Prescriptions Disp Refills   Continuous Glucose Sensor (FREESTYLE LIBRE 2 SENSOR) MISC 6 each 2    Sig: Utilize as recommended to monitor blood glucose.Change every 14 days.     Endocrinology: Diabetes - Testing Supplies Passed - 10/12/2023  4:21 PM      Passed - Valid encounter within last 12 months    Recent Outpatient Visits           2 months ago Annual physical exam   [facility], Ray Caffey, MD   8 months ago Type 2 diabetes mellitus with hyperglycemia, with long-term current use of insulin  [facility])   [facility], Ray Caffey, MD   11 months ago Type 2 diabetes mellitus with hyperglycemia, with long-term current use of insulin  [facility])   [facility], Amy J, NP   1 year ago Type 2 diabetes mellitus with diabetic neuropathy, with long-term current use of insulin  [facility])   [facility] [facility], Ray Caffey, MD   1 year ago Type 2 diabetes mellitus with diabetic neuropathy, with long-term current use of insulin  [facility])   [facility] [place] [facility], MD

## 2023-10-12 NOTE — Telephone Encounter (Signed)
 Pt scheduled with [place]

## 2023-10-15 ENCOUNTER — Other Ambulatory Visit: Payer: Self-pay

## 2023-10-15 ENCOUNTER — Other Ambulatory Visit: Payer: Self-pay | Admitting: Family Medicine

## 2023-10-19 ENCOUNTER — Other Ambulatory Visit: Payer: Self-pay | Admitting: Family Medicine

## 2023-10-19 DIAGNOSIS — E1165 Type 2 diabetes mellitus with hyperglycemia: Secondary | ICD-10-CM

## 2023-10-22 NOTE — Telephone Encounter (Signed)
 Complete. Schedule appointment with Abraham Abo, MD.

## 2023-10-26 ENCOUNTER — Telehealth: Admitting: Physician Assistant

## 2023-10-26 DIAGNOSIS — R042 Hemoptysis: Secondary | ICD-10-CM

## 2023-10-26 DIAGNOSIS — K2960 Other gastritis without bleeding: Secondary | ICD-10-CM

## 2023-10-26 NOTE — Progress Notes (Signed)
 Virtual Visit Consent   Bradley Howe, you are scheduled for a virtual visit with a [facility] provider today. Just as with appointments in the office, your consent must be obtained to participate. Your consent will be active for this visit and any virtual visit you may have with one of our providers in the next 365 days. If you have a MyChart account, a copy of this consent can be sent to you electronically.  As this is a virtual visit, video technology does not allow for your provider to perform a traditional examination. This may limit your provider's ability to fully assess your condition. If your provider identifies any concerns that need to be evaluated in person or the need to arrange testing (such as labs, EKG, etc.), we will make arrangements to do so. Although advances in technology are sophisticated, we cannot ensure that it will always work on either your end or our end. If the connection with a video visit is poor, the visit may have to be switched to a telephone visit. With either a video or telephone visit, we are not always able to ensure that we have a secure connection.  By engaging in this virtual visit, you consent to the provision of healthcare and authorize for your insurance to be billed (if applicable) for the services provided during this visit. Depending on your insurance coverage, you may receive a charge related to this service.  I need to obtain your verbal consent now. Are you willing to proceed with your visit today? Bradley Howe has provided verbal consent on 10/26/2023 for a virtual visit (video or telephone). Angelia Kelp, PA-C  Date: 10/26/2023 10:32 AM   Virtual Visit via Video Note   I, Angelia Kelp, connected with  Bradley Howe  ([id number], [birth year]) on 10/26/23 at  9:30 AM EDT by a video-enabled telemedicine application and verified that I am speaking with the correct person using two identifiers.  Location: Patient: Virtual Visit Location  Patient: Home Provider: Virtual Visit Location Provider: Home Office   I discussed the limitations of evaluation and management by telemedicine and the availability of in person appointments. The patient expressed understanding and agreed to proceed.    History of Present Illness: Bradley Howe is a 58 y.o. who identifies as a male who was assigned male at birth, and is being seen today for hemoptysis. Has been occurring for 5 days and worsening with amount and frequency. Having associated abdominal pain that is progressing as well. He did recently take high doses of ibuprofen  frequently for about 2 weeks for a dental infection. He does drink alcohol, but reports only socially and none recently prior to onset of these symptoms. He does report feeling generally unwell and fatigued. Denies lightheadedness.   Problems:  Patient Active Problem List   Diagnosis Date Noted   Hypoglycemia due to type 2 diabetes mellitus (HCC) 07/08/2023   History of cocaine abuse (HCC) 07/08/2023   Erectile dysfunction 04/21/2021   Obesity, unspecified 08/20/2019   Ex-smoker 08/20/2019   GERD (gastroesophageal reflux disease) 08/20/2019   Elevated LFTs 08/20/2019   Angina pectoris (HCC)    Chest pain 09/17/2015   Type 2 diabetes mellitus with hyperglycemia, with long-term current use of insulin  (HCC) 01/15/2013   Hypercholesterolemia 01/15/2013   Renal insufficiency 07/26/2012   Hyponatremia 07/26/2012   Essential (primary) hypertension 07/26/2012   Left inguinal hernia 05/19/2011    Allergies:  Allergies  Allergen Reactions   Metformin  And Related Diarrhea   Medications:  Current Outpatient Medications:    amoxicillin -clavulanate (AUGMENTIN ) 875-125 MG tablet, Take 1 tablet by mouth 2 (two) times daily., Disp: 14 tablet, Rfl: 0   Continuous Glucose Sensor (FREESTYLE LIBRE 2 SENSOR) MISC, Utilize as recommended to monitor blood glucose.Change every 14 days., Disp: 6 each, Rfl: 3   gabapentin  (NEURONTIN )  300 MG capsule, Take 1 capsule (300 mg total) by mouth 3 (three) times daily. (Patient taking differently: Take 900 mg by mouth at bedtime.), Disp: 270 capsule, Rfl: 0   HUMULIN  70/30 KWIKPEN (70-30) 100 UNIT/ML KwikPen, INJECT 20 UNITS SUBCUTANEOUSLY TWICE DAILY WITH A MEAL, Disp: 15 mL, Rfl: 0   losartan  (COZAAR ) 50 MG tablet, Take 1 tablet (50 mg total) by mouth daily., Disp: 90 tablet, Rfl: 0   naproxen  (NAPROSYN ) 500 MG tablet, Take 1 tablet (500 mg total) by mouth 2 (two) times daily with a meal., Disp: 20 tablet, Rfl: 0   pantoprazole  (PROTONIX ) 40 MG tablet, Take 1 tablet (40 mg total) by mouth daily., Disp: 30 tablet, Rfl: 0   rosuvastatin  (CRESTOR ) 20 MG tablet, Take 1 tablet by mouth once daily, Disp: 90 tablet, Rfl: 0   TRULICITY  1.5 MG/0.5ML SOAJ, INJECT 1.5 MG SUBCUTANEOUSLY ONCE A WEEK, Disp: 4 mL, Rfl: 0   TRULICITY  1.5 MG/0.5ML SOAJ, INJECT 1.5 MG   SUBCUTANEOUSLY ONCE A WEEK, Disp: 4 mL, Rfl: 0  Observations/Objective: Patient is well-developed, well-nourished in no acute distress.  Resting comfortably at home.  Head is normocephalic, atraumatic.  No labored breathing.  Speech is clear and coherent with logical content.  Patient is alert and oriented at baseline.    Assessment and Plan: 1. Hemoptysis (Primary)  2. NSAID induced gastritis  - Due to worsening hemoptysis and abdominal pain in the setting of recent NSAID use, I have advised him to seek further evaluation in person for acute GI bleed, he voiced understanding  Recommend evaluation ASAP at the closest ER facility for acute upper GI bleed from recent NSAID use  Follow Up Instructions: I discussed the assessment and treatment plan with the patient. The patient was provided an opportunity to ask questions and all were answered. The patient agreed with the plan and demonstrated an understanding of the instructions.  A copy of instructions were sent to the patient via MyChart unless otherwise noted below.    The  patient was advised to call back or seek an in-person evaluation if the symptoms worsen or if the condition fails to improve as anticipated.    Angelia Kelp, PA-C

## 2023-10-26 NOTE — Patient Instructions (Signed)
  Bradley Howe, thank you for joining Angelia Kelp, PA-C for today's virtual visit.  While this provider is not your primary care provider (PCP), if your PCP is located in our provider database this encounter information will be shared with them immediately following your visit.   A [facility] MyChart account gives you access to today's visit and all your visits, tests, and labs performed at [facility]  click here if you don't have a [facility] MyChart account or go to [email or web address]  Consent: (Patient) Bradley Howe provided verbal consent for this virtual visit at the beginning of the encounter.  Current Medications:  Current Outpatient Medications:    amoxicillin -clavulanate (AUGMENTIN ) 875-125 MG tablet, Take 1 tablet by mouth 2 (two) times daily., Disp: 14 tablet, Rfl: 0   Continuous Glucose Sensor (FREESTYLE LIBRE 2 SENSOR) MISC, Utilize as recommended to monitor blood glucose.Change every 14 days., Disp: 6 each, Rfl: 3   gabapentin  (NEURONTIN ) 300 MG capsule, Take 1 capsule (300 mg total) by mouth 3 (three) times daily. (Patient taking differently: Take 900 mg by mouth at bedtime.), Disp: 270 capsule, Rfl: 0   HUMULIN  70/30 KWIKPEN (70-30) 100 UNIT/ML KwikPen, INJECT 20 UNITS SUBCUTANEOUSLY TWICE DAILY WITH A MEAL, Disp: 15 mL, Rfl: 0   losartan  (COZAAR ) 50 MG tablet, Take 1 tablet (50 mg total) by mouth daily., Disp: 90 tablet, Rfl: 0   naproxen  (NAPROSYN ) 500 MG tablet, Take 1 tablet (500 mg total) by mouth 2 (two) times daily with a meal., Disp: 20 tablet, Rfl: 0   pantoprazole  (PROTONIX ) 40 MG tablet, Take 1 tablet (40 mg total) by mouth daily., Disp: 30 tablet, Rfl: 0   rosuvastatin  (CRESTOR ) 20 MG tablet, Take 1 tablet by mouth once daily, Disp: 90 tablet, Rfl: 0   TRULICITY  1.5 MG/0.5ML SOAJ, INJECT 1.5 MG SUBCUTANEOUSLY ONCE A WEEK, Disp: 4 mL, Rfl: 0   TRULICITY  1.5 MG/0.5ML SOAJ, INJECT 1.5 MG   SUBCUTANEOUSLY ONCE A WEEK, Disp: 4 mL, Rfl: 0    Medications ordered in this encounter:  No orders of the defined types were placed in this encounter.    *If you need refills on other medications prior to your next appointment, please contact your pharmacy*  Follow-Up: Call back or seek an in-person evaluation if the symptoms worsen or if the condition fails to improve as anticipated.  [facility] [phone]  Other Instructions  Recommend evaluation ASAP at the closest ER facility for acute upper GI bleed from recent NSAID use   If you have been instructed to have an in-person evaluation today at a local Urgent Care facility, please use the link below. It will take you to a list of all of our available [facility], including address, phone number and hours of operation. Please do not delay care.  [facility]  If you or a family member do not have a primary care provider, use the link below to schedule a visit and establish care. When you choose a [facility] primary care physician or advanced practice provider, you gain a long-term partner in health. Find a Primary Care Provider  Learn more about [facility] in-office and virtual care options: [facility] - Get Care Now

## 2023-11-05 ENCOUNTER — Other Ambulatory Visit: Payer: Self-pay | Admitting: Family Medicine

## 2023-11-05 ENCOUNTER — Ambulatory Visit: Payer: Self-pay

## 2023-11-05 MED ORDER — HUMULIN 70/30 KWIKPEN (70-30) 100 UNIT/ML ~~LOC~~ SUPN: PEN_INJECTOR | SUBCUTANEOUS | 0 refills | Status: DC

## 2023-11-05 NOTE — Telephone Encounter (Unsigned)
 Copied from CRM [id number]. Topic: Clinical - Medication Refill >> Nov 05, 2023 10:00 AM Carla L wrote: Medication: HUMULIN  70/30 KWIKPEN (70-30) 100 UNIT/ML KwikPen Pt inquiring if he could get more than one refill at a time as he struggles every month w/ getting medication filled.   Has the patient contacted their pharmacy? Yes Told to contact the office as they sent request on 06/20 and have not heard back.   This is the patient's preferred pharmacy:  [facility] [address] [place] - [address] [place] [address] Phone: [phone] Fax: [phone]  Is this the correct pharmacy for this prescription? Yes  Has the prescription been filled recently? Yes  Is the patient out of the medication? Yes, has been out of medication for two days now.   Has the patient been seen for an appointment in the last year OR does the patient have an upcoming appointment? Yes  Can we respond through MyChart? No  Agent: Please be advised that Rx refills may take up to 3 business days. We ask that you follow-up with your pharmacy.

## 2023-11-05 NOTE — Telephone Encounter (Signed)
 FYI Only or Action Required?: Action required by provider: medication refill request.  Patient was last seen in primary care on 08/06/2023 by Moishe Chiquita HERO, NP. Called Nurse Triage reporting Blood Sugar Problem. Symptoms began yesterday. Interventions attempted: Nothing. Symptoms are: gradually worsening.  Triage Disposition: See Physician Within 24 Hours Pt has appt 11/08/22  Patient/caregiver understands and will follow disposition?: yes      Copied from CRM [id number]. Topic: Clinical - Red Word Triage >> Nov 05, 2023 10:03 AM Tobias L wrote: Red Word that prompted transfer to Nurse Triage: pt out of insulin , sugar levels at 253, were in the 300's yesterday Reason for Disposition  [1] Symptoms of high blood sugar (e.g., abnormally thirsty, frequent urination, weight loss) AND [2] not able to test blood glucose  Answer Assessment - Initial Assessment Questions Additional Information:  --Patient requesting for a prescription to be sent today, as he is completely out.   --Patient doesn't see MD Tanda until 6/26   --Patient also requesting for the units on the prescription to be changed to 25units ( Patient reports he has continuously ran out of his Insulin  prescription earlier than planned because despite MD Tanda instructing him to take 25units, the prescription still notes 20units.)  -- [facility] called, they reported they will send over a prescription for the patient today. -Pt informed and verbalize understanding.  ------------------------------------------------------------------------------------------------------      1. BLOOD GLUCOSE: What is your blood glucose level?      253: 950am    2. ONSET: When did you check the blood glucose?     ------ 2 days ( last does was Saturday )    3. USUAL RANGE: What is your glucose level usually? (e.g., usual fasting morning value, usual evening value)     Morning : 90-130~    4. KETONES: Do you check for ketones (urine  or blood test strips)? If Yes, ask: What does the test show now?      -----n/a    5. TYPE 1 or 2:  Do you know what type of diabetes you have?  (e.g., Type 1, Type 2, Gestational; doesn't know)      Type 1    6. INSULIN : Do you take insulin ? What type of insulin (s) do you use? What is the mode of delivery? (syringe, pen; injection or pump)?      --- Yes   7. DIABETES PILLS: Do you take any pills for your diabetes? If Yes, ask: Have you missed taking any pills recently?     -----------   8. OTHER SYMPTOMS: Do you have any symptoms? (e.g., fever, frequent urination, difficulty breathing, dizziness, weakness, vomiting) -------- Dry mouth ----------- Freq urination ( at night)      Patient educated on pertinent s/s that would warrant emergent help/call 911/ ED/UC.  Patient verbalized understanding and agrees with plan No additional questions/concerns noted during the time of the call.  Protocols used: Diabetes - High Blood Sugar-A-AH

## 2023-11-06 ENCOUNTER — Other Ambulatory Visit: Payer: Self-pay

## 2023-11-06 NOTE — Telephone Encounter (Signed)
 Requested Prescriptions  Refused Prescriptions Disp Refills   insulin  isophane & regular human KwikPen (HUMULIN  70/30 KWIKPEN) (70-30) 100 UNIT/ML KwikPen 15 mL 0    Sig: INJECT 25 UNITS SUBCUTANEOUSLY TWICE DAILY WITH A MEAL     Endocrinology:  Diabetes - Insulins Passed - 11/06/2023  5:48 PM      Passed - HBA1C is between 0 and 7.9 and within 180 days    HbA1c, POC (controlled diabetic range)  Date Value Ref Range Status  10/30/2022 7.0 0.0 - 7.0 % Final   Hgb A1c MFr Bld  Date Value Ref Range Status  07/09/2023 7.3 (H) 4.8 - 5.6 % Final    Comment:    (NOTE) Pre diabetes:          5.7%-6.4%  Diabetes:              >6.4%  Glycemic control for   <7.0% adults with diabetes          Passed - Valid encounter within last 6 months    Recent Outpatient Visits           3 months ago Annual physical exam   [place] [facility], Raguel, MD   9 months ago Type 2 diabetes mellitus with hyperglycemia, with long-term current use of insulin  [facility])   [place] [facility], Raguel, MD   1 year ago Type 2 diabetes mellitus with hyperglycemia, with long-term current use of insulin  [facility])   [place] [facility], Amy J, NP   1 year ago Type 2 diabetes mellitus with diabetic neuropathy, with long-term current use of insulin  [facility])   [place] [facility], Raguel, MD   1 year ago Type 2 diabetes mellitus with diabetic neuropathy, with long-term current use of insulin  [facility])   [place] [facility], MD

## 2023-11-08 ENCOUNTER — Ambulatory Visit: Admitting: Family Medicine

## 2023-11-08 ENCOUNTER — Encounter: Payer: Self-pay | Admitting: Family Medicine

## 2023-11-08 VITALS — BP 137/79 | HR 79

## 2023-11-08 DIAGNOSIS — K0889 Other specified disorders of teeth and supporting structures: Secondary | ICD-10-CM

## 2023-11-08 DIAGNOSIS — Z794 Long term (current) use of insulin: Secondary | ICD-10-CM

## 2023-11-08 DIAGNOSIS — E1165 Type 2 diabetes mellitus with hyperglycemia: Secondary | ICD-10-CM

## 2023-11-08 LAB — POCT GLYCOSYLATED HEMOGLOBIN (HGB A1C): HbA1c, POC (controlled diabetic range): 7.2 % — AB (ref 0.0–7.0)

## 2023-11-08 MED ORDER — AMOXICILLIN 875 MG PO TABS: 875.0000 mg | ORAL_TABLET | Freq: Two times a day (BID) | ORAL | 0 refills | Status: DC

## 2023-11-09 ENCOUNTER — Ambulatory Visit: Attending: Family Medicine | Admitting: Pharmacist

## 2023-11-09 ENCOUNTER — Telehealth: Payer: Self-pay | Admitting: Pharmacist

## 2023-11-09 ENCOUNTER — Other Ambulatory Visit: Payer: Self-pay

## 2023-11-09 ENCOUNTER — Encounter: Payer: Self-pay | Admitting: Family Medicine

## 2023-11-09 ENCOUNTER — Encounter: Payer: Self-pay | Admitting: Pharmacist

## 2023-11-09 DIAGNOSIS — E1165 Type 2 diabetes mellitus with hyperglycemia: Secondary | ICD-10-CM | POA: Diagnosis not present

## 2023-11-09 DIAGNOSIS — Z7985 Long-term (current) use of injectable non-insulin antidiabetic drugs: Secondary | ICD-10-CM | POA: Diagnosis not present

## 2023-11-09 DIAGNOSIS — Z794 Long term (current) use of insulin: Secondary | ICD-10-CM

## 2023-11-09 MED ORDER — SEMAGLUTIDE (1 MG/DOSE) 4 MG/3ML ~~LOC~~ SOPN: 1.0000 mg | PEN_INJECTOR | SUBCUTANEOUS | 1 refills | Status: DC

## 2023-11-09 NOTE — Progress Notes (Signed)
 Established Patient Office Visit  Subjective    Patient ID: Bradley Howe, male    DOB: [birth year]  Age: 58 y.o. MRN: [id number]  CC:  Chief Complaint  Patient presents with   Diabetes   Dental Pain    HPI Bradley Howe presents for follow up of diabetes. Patient also reports dental pain with an appt scheduled for further eval/mgt. He believes that he has an infection.   Outpatient Encounter Medications as of 11/08/2023  Medication Sig   amoxicillin  (AMOXIL ) 875 MG tablet Take 1 tablet (875 mg total) by mouth 2 (two) times daily for 10 days.   amoxicillin -clavulanate (AUGMENTIN ) 875-125 MG tablet Take 1 tablet by mouth 2 (two) times daily.   Continuous Glucose Sensor (FREESTYLE LIBRE 2 SENSOR) MISC Utilize as recommended to monitor blood glucose.Change every 14 days.   gabapentin  (NEURONTIN ) 300 MG capsule Take 1 capsule (300 mg total) by mouth 3 (three) times daily. (Patient taking differently: Take 900 mg by mouth at bedtime.)   insulin  isophane & regular human KwikPen (HUMULIN  70/30 KWIKPEN) (70-30) 100 UNIT/ML KwikPen INJECT 25 UNITS SUBCUTANEOUSLY TWICE DAILY WITH A MEAL   losartan  (COZAAR ) 50 MG tablet Take 1 tablet (50 mg total) by mouth daily.   naproxen  (NAPROSYN ) 500 MG tablet Take 1 tablet (500 mg total) by mouth 2 (two) times daily with a meal.   pantoprazole  (PROTONIX ) 40 MG tablet Take 1 tablet (40 mg total) by mouth daily.   rosuvastatin  (CRESTOR ) 20 MG tablet Take 1 tablet by mouth once daily   TRULICITY  1.5 MG/0.5ML SOAJ INJECT 1.5 MG SUBCUTANEOUSLY ONCE A WEEK   TRULICITY  1.5 MG/0.5ML SOAJ INJECT 1.5 MG   SUBCUTANEOUSLY ONCE A WEEK   No facility-administered encounter medications on file as of 11/08/2023.    Past Medical History:  Diagnosis Date   GERD (gastroesophageal reflux disease)    High cholesterol    Hypertension    Type II diabetes mellitus (HCC)     Past Surgical History:  Procedure Laterality Date   INGUINAL HERNIA REPAIR Left 2013    Family  History  Problem Relation Age of Onset   Hypertension Mother    Hypertension Father    Diabetes Paternal Grandmother    Heart disease Neg Hx    Colon cancer Neg Hx    Pancreatic cancer Neg Hx    Esophageal cancer Neg Hx    Stomach cancer Neg Hx    Rectal cancer Neg Hx     Social History   Socioeconomic History   Marital status: Married    Spouse name: Not on file   Number of children: Not on file   Years of education: Not on file   Highest education level: 12th grade  Occupational History   Not on file  Tobacco Use   Smoking status: Former    Current packs/day: 0.10    Average packs/day: 0.1 packs/day for 25.0 years (2.5 ttl pk-yrs)    Types: Cigarettes   Smokeless tobacco: Never   Tobacco comments:    quit smoking cigarettes in 2013  Vaping Use   Vaping status: Never Used  Substance and Sexual Activity   Alcohol use: Yes    Comment: occ   Drug use: No   Sexual activity: Yes  Other Topics Concern   Not on file  Social History Narrative   Not on file   Social Drivers of Health   Financial Resource Strain: Low Risk  (11/05/2023)   Overall Financial Resource Strain (CARDIA)  Difficulty of Paying Living Expenses: Not hard at all  Food Insecurity: No Food Insecurity (11/05/2023)   Hunger Vital Sign    Worried About Running Out of Food in the Last Year: Never true    Ran Out of Food in the Last Year: Never true  Transportation Needs: No Transportation Needs (11/05/2023)   PRAPARE - Administrator, Civil Service (Medical): No    Lack of Transportation (Non-Medical): No  Physical Activity: Insufficiently Active (11/05/2023)   Exercise Vital Sign    Days of Exercise per Week: 2 days    Minutes of Exercise per Session: 40 min  Stress: No Stress Concern Present (11/05/2023)   [employer] of Occupational Health - Occupational Stress Questionnaire    Feeling of Stress: Not at all  Social Connections: Unknown (11/05/2023)   Social Connection and  Isolation Panel    Frequency of Communication with Friends and Family: Twice a week    Frequency of Social Gatherings with Friends and Family: Patient declined    Attends Religious Services: Patient declined    Database administrator or Organizations: No    Attends Engineer, structural: Not on file    Marital Status: Married  Catering manager Violence: Not At Risk (07/08/2023)   Humiliation, Afraid, Rape, and Kick questionnaire    Fear of Current or Ex-Partner: No    Emotionally Abused: No    Physically Abused: No    Sexually Abused: No    Review of Systems  All other systems reviewed and are negative.       Objective    BP 137/79 (BP Location: Right Arm, Patient Position: Sitting, Cuff Size: Normal)   Pulse 79   SpO2 93%   Physical Exam Vitals and nursing note reviewed.  Constitutional:      General: He is not in acute distress. HENT:     Mouth/Throat:     Dentition: Dental tenderness present.   Cardiovascular:     Rate and Rhythm: Normal rate and regular rhythm.  Pulmonary:     Effort: Pulmonary effort is normal.     Breath sounds: Normal breath sounds.  Abdominal:     Palpations: Abdomen is soft.     Tenderness: There is no abdominal tenderness.   Neurological:     General: No focal deficit present.     Mental Status: He is alert and oriented to person, place, and time.         Assessment & Plan:  1. Type 2 diabetes mellitus with hyperglycemia, with long-term current use of insulin  (HCC) (Primary) Improved A1c and just above goal. Continue  - POCT glycosylated hemoglobin (Hb A1C)  2. Pain, dental Amox prescribed.   Return in about 3 months (around 02/08/2024) for follow up, chronic med issues.   Tanda Raguel SQUIBB, MD

## 2023-11-09 NOTE — Progress Notes (Signed)
 S:    PCP: Dr. Tanda   No chief complaint on file.  Patient arrives in good spirits. Presents for diabetes evaluation, education, and management. Patient was referred and last seen by Primary Care Provider on 11/08/2023. A1c was 7.2 at that visit.    Patient reports Diabetes was diagnosed > 10 years ago. Pt says at the time he was symptomatic and went to the hospital. He was started on Lantus. He tells me the Lantus worked well for him, however, it became too expensive over time and that is why he is taking 70/30 insulin . He cannot tolerate metformin . Denies any hx of ACS, stroke, CHF, or CKD. No thyroid cancer. No pancreatitis.   Pharmacy saw him a couple of times in 2023 (06/2021 and 07/2021). We had him on a regimen of Ozempic  and NPH insulin . At that time, his A1c trended down from 10.7 to 8.4%. A1c continued to improve throughout 2023 with it reaching goal (6.8%) on 04/12/2022. Of note, his insurance dictated a change from Ozempic  to Trulicity  in 12/2021. He maintained pretty good control with A1c ranging from 6.4 - 7.0 between 12/2021 and 01/2023. Most recently, his A1c was 7.2 on 11/08/2023.   Today, he is doing well. Tolerates Trulicity  with no NV, abdominal pain. He is interested in changing back to Ozempic  for better weight loss and A1c control. He now has [employer] and a PA should be approved for Ozempic .   PMH: HTN, obesity, T2DM, HLD, and GERD.  Family/Social History:  -Fhx: DM  -Tobacco: never smoker  -Alcohol: endorses occasional use  Insurance coverage/medication affordability: [employer]  Medication adherence reported.   Current diabetes medications include: insulin  NPH 70/30 25 units BID, Trulicity  1.5 mg weekly  Current hypertension medications include: losartan  50 mg daily Current hyperlipidemia medications include: rosuvastatin  20 mg daily   Patient denies hypoglycemic events.   Patient reported dietary habits:  - Drinks coffee (1 cup), 3-4 glasses of water daily,  apple juice (~1 glass several times weekly) - Denies eating excess sweets. Will sometimes eat these to treat relative hypoglycemia.  - Admits to heavy consumption of carbs   Patient-reported exercise habits: none outside of work   Patient denies polyuria (nighttime urination).  Patient denies neuropathy (nerve pain). Patient denies visual changes. Patient reports self foot exams.   O:  Date of Download: 11/09/2023, 30-day report % Time CGM is active: 100% Average Glucose: 156 mg/dL Glucose Management Indicator: 7.2  Time in Goal:  - Time in range 70-180: 63% - Time above range: 36% - Time below range: 1% Observed patterns:  Lab Results  Component Value Date   HGBA1C 7.2 (A) 11/08/2023   There were no vitals filed for this visit.  Lipid Panel     Component Value Date/Time   CHOL 136 07/23/2023 1425   TRIG 213 (H) 07/23/2023 1425   HDL 34 (L) 07/23/2023 1425   CHOLHDL 4.0 07/23/2023 1425   CHOLHDL 3.9 07/10/2023 0839   VLDL 38 07/10/2023 0839   LDLCALC 67 07/23/2023 1425   Clinical Atherosclerotic Cardiovascular Disease (ASCVD): No  The 10-year ASCVD risk score (Arnett DK, et al., 2019) is: 25.9%   Values used to calculate the score:     Age: 58 years     Clincally relevant sex: Male     Is Non-Hispanic African American: Yes     Diabetic: Yes     Tobacco smoker: No     Systolic Blood Pressure: 137 mmHg     Is  BP treated: Yes     HDL Cholesterol: 34 mg/dL     Total Cholesterol: 136 mg/dL   A/P:  Diabetes longstanding currently just above goal with A1c of 7.2%. We'll pursue PA approval for Ozempic . He is not currently symptomatic at this time. Patient is able to verbalize appropriate hypoglycemia management plan. Medication adherence appears appropriate.  -Stop Trulicity . -Started Ozempic  to 1 mg weekly.  -Continue 70/30 25 units BID.  -Extensively discussed pathophysiology of diabetes, recommended lifestyle interventions, dietary effects on blood sugar  control -Counseled on s/sx of and management of hypoglycemia -Next A1C anticipated 01/2024.   Written patient instructions provided.  Total time in face to face counseling 30 minutes.   Follow up Pharmacist Clinic Visit in 1 month.     Herlene Fleeta Morris, PharmD, JAQUELINE, CPP Clinical Pharmacist [facility] [phone]

## 2023-11-09 NOTE — Telephone Encounter (Signed)
 Can we start a PA for the Ozempic ?

## 2023-11-12 ENCOUNTER — Other Ambulatory Visit: Payer: Self-pay

## 2023-11-19 ENCOUNTER — Ambulatory Visit (AMBULATORY_SURGERY_CENTER)

## 2023-11-19 ENCOUNTER — Other Ambulatory Visit: Payer: Self-pay | Admitting: Family Medicine

## 2023-11-19 VITALS — Ht 67.0 in | Wt 234.6 lb

## 2023-11-19 DIAGNOSIS — Z860100 Personal history of colon polyps, unspecified: Secondary | ICD-10-CM

## 2023-11-19 DIAGNOSIS — E785 Hyperlipidemia, unspecified: Secondary | ICD-10-CM

## 2023-11-19 MED ORDER — NA SULFATE-K SULFATE-MG SULF 17.5-3.13-1.6 GM/177ML PO SOLN: 1.0000 | Freq: Once | ORAL | 0 refills | Status: AC

## 2023-11-19 MED ORDER — NA SULFATE-K SULFATE-MG SULF 17.5-3.13-1.6 GM/177ML PO SOLN: 1.0000 | Freq: Once | ORAL | 0 refills | Status: DC

## 2023-11-19 NOTE — Progress Notes (Signed)

## 2023-11-30 ENCOUNTER — Other Ambulatory Visit: Payer: Self-pay | Admitting: Family Medicine

## 2023-12-07 ENCOUNTER — Encounter: Payer: Self-pay | Admitting: Internal Medicine

## 2023-12-07 ENCOUNTER — Ambulatory Visit: Admitting: Pharmacist

## 2023-12-07 ENCOUNTER — Ambulatory Visit: Admitting: Internal Medicine

## 2023-12-07 VITALS — BP 127/91 | HR 63 | Temp 97.3°F | Resp 14 | Ht 67.0 in | Wt 234.6 lb

## 2023-12-07 DIAGNOSIS — Z860101 Personal history of adenomatous and serrated colon polyps: Secondary | ICD-10-CM | POA: Diagnosis not present

## 2023-12-07 DIAGNOSIS — Z860100 Personal history of colon polyps, unspecified: Secondary | ICD-10-CM

## 2023-12-07 DIAGNOSIS — Z1211 Encounter for screening for malignant neoplasm of colon: Secondary | ICD-10-CM | POA: Diagnosis not present

## 2023-12-07 MED ORDER — SODIUM CHLORIDE 0.9 % IV SOLN: 500.0000 mL | INTRAVENOUS | Status: DC

## 2023-12-07 NOTE — Progress Notes (Signed)
 HISTORY OF PRESENT ILLNESS:  Bradley Howe is a 58 y.o. male, former patient of Dr. Teressa who underwent colonoscopy November 2021.  Was found to have multiple adenomatous colon polyps (6).  Now presents for follow-up surveillance colonoscopy  REVIEW OF SYSTEMS:  All non-GI ROS negative except for  Past Medical History:  Diagnosis Date   GERD (gastroesophageal reflux disease)    High cholesterol    Hypertension    Type II diabetes mellitus (HCC)     Past Surgical History:  Procedure Laterality Date   COLONOSCOPY     INGUINAL HERNIA REPAIR Left 05/16/2011    Social History Bradley Howe  reports that he has quit smoking. His smoking use included cigarettes. He has a 2.5 pack-year smoking history. He has never used smokeless tobacco. He reports current alcohol use. He reports that he does not use drugs.  family history includes Diabetes in his paternal grandmother; Hypertension in his father and mother.  Allergies  Allergen Reactions   Metformin  And Related Diarrhea       PHYSICAL EXAMINATION: Vital signs: BP 139/88   Pulse 69   Temp (!) 97.3 F (36.3 C) (Temporal)   Ht 5' 7 (1.702 m)   Wt 234 lb 9.6 oz (106.4 kg)   SpO2 97%   BMI 36.74 kg/m  General: Well-developed, well-nourished, no acute distress HEENT: Sclerae are anicteric, conjunctiva pink. Oral mucosa intact Lungs: Clear Heart: Regular Abdomen: soft, nontender, nondistended, no obvious ascites, no peritoneal signs, normal bowel sounds. No organomegaly. Extremities: No edema Psychiatric: alert and oriented x3. Cooperative     ASSESSMENT:  Personal history of multiple adenomatous polyps   PLAN:  Surveillance colonoscopy

## 2023-12-07 NOTE — Patient Instructions (Signed)
 Resume previous diet and medications.  Repeat colonoscopy in 5 years.    YOU HAD AN ENDOSCOPIC PROCEDURE TODAY AT THE [facility]:   Refer to the procedure report that was given to you for any specific questions about what was found during the examination.  If the procedure report does not answer your questions, please call your gastroenterologist to clarify.  If you requested that your care partner not be given the details of your procedure findings, then the procedure report has been included in a sealed envelope for you to review at your convenience later.  YOU SHOULD EXPECT: Some feelings of bloating in the abdomen. Passage of more gas than usual.  Walking can help get rid of the air that was put into your GI tract during the procedure and reduce the bloating. If you had a lower endoscopy (such as a colonoscopy or flexible sigmoidoscopy) you may notice spotting of blood in your stool or on the toilet paper. If you underwent a bowel prep for your procedure, you may not have a normal bowel movement for a few days.  Please Note:  You might notice some irritation and congestion in your nose or some drainage.  This is from the oxygen used during your procedure.  There is no need for concern and it should clear up in a day or so.  SYMPTOMS TO REPORT IMMEDIATELY:  Following lower endoscopy (colonoscopy or flexible sigmoidoscopy):  Excessive amounts of blood in the stool  Significant tenderness or worsening of abdominal pains  Swelling of the abdomen that is new, acute  Fever of 100F or higher  For urgent or emergent issues, a gastroenterologist can be reached at any hour by calling [phone]. Do not use MyChart messaging for urgent concerns.    DIET:  We do recommend a small meal at first, but then you may proceed to your regular diet.  Drink plenty of fluids but you should avoid alcoholic beverages for 24 hours.  ACTIVITY:  You should plan to take it easy for the rest of today  and you should NOT DRIVE or use heavy machinery until tomorrow (because of the sedation medicines used during the test).    FOLLOW UP: Our staff will call the number listed on your records the next business day following your procedure.  We will call around 7:15- 8:00 am to check on you and address any questions or concerns that you may have regarding the information given to you following your procedure. If we do not reach you, we will leave a message.     If any biopsies were taken you will be contacted by phone or by letter within the next 1-3 weeks.  Please call us  at [phone] if you have not heard about the biopsies in 3 weeks.    SIGNATURES/CONFIDENTIALITY: You and/or your care partner have signed paperwork which will be entered into your electronic medical record.  These signatures attest to the fact that that the information above on your After Visit Summary has been reviewed and is understood.  Full responsibility of the confidentiality of this discharge information lies with you and/or your care-partner.

## 2023-12-07 NOTE — Op Note (Signed)
 [facility] Patient Name: Vicki Pasqual Procedure Date: 12/07/2023 8:56 AM MRN: [id number] Endoscopist: Norleen SAILOR. Abran , MD, [id number] Age: 58 Referring MD:  Date of Birth: 26-Jan-1966 Gender: Male Account #: [id number] Procedure:                Colonoscopy Indications:              High risk colon cancer surveillance: Personal                            history of multiple (3 or more) adenomas (Dr.                            Teressa November 2021) Medicines:                Monitored Anesthesia Care Procedure:                Pre-Anesthesia Assessment:                           - Prior to the procedure, a History and Physical                            was performed, and patient medications and                            allergies were reviewed. The patient's tolerance of                            previous anesthesia was also reviewed. The risks                            and benefits of the procedure and the sedation                            options and risks were discussed with the patient.                            All questions were answered, and informed consent                            was obtained. Prior Anticoagulants: The patient has                            taken no anticoagulant or antiplatelet agents. ASA                            Grade Assessment: II - A patient with mild systemic                            disease. After reviewing the risks and benefits,                            the patient was deemed in satisfactory condition to  undergo the procedure.                           After obtaining informed consent, the colonoscope                            was passed under direct vision. Throughout the                            procedure, the patient's blood pressure, pulse, and                            oxygen saturations were monitored continuously. The                            CF [id number] #[id number] was introduced through the  anus                            and advanced to the the cecum, identified by                            appendiceal orifice and ileocecal valve. The                            ileocecal valve, appendiceal orifice, and rectum                            were photographed. The quality of the bowel                            preparation was excellent. The colonoscopy was                            performed without difficulty. The patient tolerated                            the procedure well. The bowel preparation used was                            SUPREP via split dose instruction. Scope In: 9:13:05 AM Scope Out: 9:32:05 AM Scope Withdrawal Time: 0 hours 12 minutes 47 seconds  Total Procedure Duration: 0 hours 19 minutes 0 seconds  Findings:                 The entire examined colon appeared normal on direct                            and retroflexion views. Complications:            No immediate complications. Estimated blood loss:                            None. Estimated Blood Loss:     Estimated blood loss: none. Impression:               - The entire examined colon  is normal on direct and                            retroflexion views.                           - No specimens collected. Recommendation:           - Repeat colonoscopy in 5 years for surveillance                            (personal history).                           - Patient has a contact number available for                            emergencies. The signs and symptoms of potential                            delayed complications were discussed with the                            patient. Return to normal activities tomorrow.                            Written discharge instructions were provided to the                            patient.                           - Resume previous diet.                           - Continue present medications. Norleen SAILOR. Abran, MD 12/07/2023 10:16:18 AM This report has been  signed electronically.

## 2023-12-07 NOTE — Progress Notes (Signed)
 Sedate, gd SR, tolerated procedure well, VSS, report to RN

## 2023-12-07 NOTE — Progress Notes (Signed)
 Pt's states no medical or surgical changes since previsit or office visit.

## 2023-12-10 ENCOUNTER — Telehealth: Payer: Self-pay | Admitting: *Deleted

## 2023-12-10 NOTE — Telephone Encounter (Signed)
 Left message on f/u call

## 2023-12-11 ENCOUNTER — Other Ambulatory Visit (HOSPITAL_COMMUNITY): Payer: Self-pay

## 2023-12-17 ENCOUNTER — Telehealth: Payer: Self-pay | Admitting: Family Medicine

## 2023-12-17 NOTE — Telephone Encounter (Signed)
 Confirmed appt for  8/5

## 2023-12-18 ENCOUNTER — Ambulatory Visit: Attending: Family Medicine | Admitting: Pharmacist

## 2023-12-18 ENCOUNTER — Telehealth: Payer: Self-pay | Admitting: Pharmacist

## 2023-12-18 ENCOUNTER — Encounter: Payer: Self-pay | Admitting: Pharmacist

## 2023-12-18 DIAGNOSIS — Z794 Long term (current) use of insulin: Secondary | ICD-10-CM | POA: Diagnosis not present

## 2023-12-18 DIAGNOSIS — Z7985 Long-term (current) use of injectable non-insulin antidiabetic drugs: Secondary | ICD-10-CM | POA: Diagnosis not present

## 2023-12-18 DIAGNOSIS — E1165 Type 2 diabetes mellitus with hyperglycemia: Secondary | ICD-10-CM | POA: Diagnosis not present

## 2023-12-18 MED ORDER — SEMAGLUTIDE (2 MG/DOSE) 8 MG/3ML ~~LOC~~ SOPN: 2.0000 mg | PEN_INJECTOR | SUBCUTANEOUS | 1 refills | Status: AC

## 2023-12-18 NOTE — Telephone Encounter (Signed)
 He may not need one, but can we submit a PA for Ozempic ? He had one approved for the 1 mg but I am increasing his dose to the 2 mg.

## 2023-12-18 NOTE — Progress Notes (Signed)
 S:    PCP: Dr. Tanda   No chief complaint on file.  Patient arrives in good spirits. Presents for diabetes evaluation, education, and management. Patient was referred and last seen by Primary Care Provider on 11/08/2023. A1c was 7.2 at that visit.    Pharmacy saw him a couple of times in 2023 (06/2021 and 07/2021). More recently, I saw him at the end of June and changed his Trulicity  to Ozempic .   Today, he is doing well. Tolerates Ozempic  with no NV, abdominal pain. Has been taking the 1 mg dose. He continues to take this weekly and takes BID 70/30 mixed insulin  at 25 units BID.    PMH: HTN, obesity, T2DM, HLD, and GERD.  Family/Social History:  -Fhx: DM  -Tobacco: never smoker  -Alcohol: endorses occasional use  Insurance coverage/medication affordability: [employer]  Medication adherence reported.   Current diabetes medications include: insulin  NPH 70/30 25 units BID, Ozempic  1 mg weekly  Current hypertension medications include: losartan  50 mg daily Current hyperlipidemia medications include: rosuvastatin  20 mg daily   Patient denies hypoglycemic events.   Patient reported dietary habits:  - Drinks coffee (1 cup), 3-4 glasses of water daily, apple juice (~1 glass several times weekly) - Denies eating excess sweets. Will sometimes eat these to treat relative hypoglycemia.  - Admits to heavy consumption of carbs   Patient-reported exercise habits: none outside of work   Patient denies polyuria (nighttime urination).  Patient denies neuropathy (nerve pain). Patient denies visual changes. Patient reports self foot exams.   O:  Date of Download: 12/18/2023, 30-day report % Time CGM is active: 100% Average Glucose: 146 mg/dL Glucose Management Indicator: NA  Time in Goal:  - Time in range 70-180: 83% - Time above range: 17% - Time below range: 0% Observed patterns:  Lab Results  Component Value Date   HGBA1C 7.2 (A) 11/08/2023   There were no vitals filed for this  visit.  Lipid Panel     Component Value Date/Time   CHOL 136 07/23/2023 1425   TRIG 213 (H) 07/23/2023 1425   HDL 34 (L) 07/23/2023 1425   CHOLHDL 4.0 07/23/2023 1425   CHOLHDL 3.9 07/10/2023 0839   VLDL 38 07/10/2023 0839   LDLCALC 67 07/23/2023 1425   Clinical Atherosclerotic Cardiovascular Disease (ASCVD): No  The 10-year ASCVD risk score (Arnett DK, et al., 2019) is: 22.9%   Values used to calculate the score:     Age: 47 years     Clincally relevant sex: Male     Is Non-Hispanic African American: Yes     Diabetic: Yes     Tobacco smoker: No     Systolic Blood Pressure: 127 mmHg     Is BP treated: Yes     HDL Cholesterol: 34 mg/dL     Total Cholesterol: 136 mg/dL   A/P:  Diabetes longstanding currently just above goal with A1c of 7.2%. AGP from his Libre CGM looks to be improved. Time in range over the last 30 days is 83% (up from 63 prior). We will increase Ozempic  to 2 mg weekly and continue 70/30 25 units BID. I have asked him to decrease to 20 units BID should he experience hypoglycemia. He is not currently symptomatic at this time. Patient is able to verbalize appropriate hypoglycemia management plan. Medication adherence appears appropriate.  -Increase Ozempic  to 2 mg weekly.  -Continue 70/30 insulin  25 units BID.  -Extensively discussed pathophysiology of diabetes, recommended lifestyle interventions, dietary effects on  blood sugar control -Counseled on s/sx of and management of hypoglycemia -Next A1C anticipated 01/2024.   Written patient instructions provided.  Total time in face to face counseling 30 minutes.   Follow up Pharmacist Clinic Visit in 1 month.     Herlene Fleeta Morris, PharmD, JAQUELINE, CPP Clinical Pharmacist [facility] [phone]

## 2023-12-19 ENCOUNTER — Other Ambulatory Visit: Payer: Self-pay

## 2023-12-24 ENCOUNTER — Telehealth: Payer: Self-pay

## 2023-12-24 ENCOUNTER — Other Ambulatory Visit: Payer: Self-pay | Admitting: Family Medicine

## 2023-12-24 NOTE — Telephone Encounter (Signed)
 Copied from CRM [id number]. Topic: Clinical - Prescription Issue >> Dec 24, 2023  3:10 PM Selinda RAMAN wrote: Reason for CRM: The patient called in stating his insurance [employer] will no longer cover a 30 day supply. He states they will only cover a 90 day supply now. Please assist patient further as he says he needs a new prescription stating this to be sent in to his pharmacy. He has enough to last a few more days.

## 2023-12-26 NOTE — Telephone Encounter (Unsigned)
 Copied from CRM #[id number]. Topic: Clinical - Medication Question >> Dec 26, 2023 12:18 PM Everette C wrote: Reason for CRM: The patient has called for an update on the submission of their 90 day prescription for HUMULIN  70/30 KWIKPEN (70-30) 100 UNIT/ML KwikPen [[id number]]  Please contact the patient further when possible

## 2023-12-27 ENCOUNTER — Telehealth: Payer: Self-pay

## 2023-12-27 ENCOUNTER — Other Ambulatory Visit: Payer: Self-pay | Admitting: Family Medicine

## 2023-12-27 MED ORDER — HUMULIN 70/30 KWIKPEN (70-30) 100 UNIT/ML ~~LOC~~ SUPN: PEN_INJECTOR | SUBCUTANEOUS | 0 refills | Status: DC

## 2023-12-27 NOTE — Telephone Encounter (Signed)
 Copied from CRM [id number]. Topic: Clinical - Medication Question >> Dec 27, 2023  9:07 AM Pinkey ORN wrote: Reason for CRM: insulin  isophane & regular human KwikPen (HUMULIN  70/30 KWIKPEN) (70-30) 100 UNIT/ML KwikPen >> Dec 27, 2023  9:10 AM Pinkey ORN wrote: Patient called in wanting to know what was going on with his 90-day supply of insulin  isophane & regular human KwikPen (HUMULIN  70/30 KWIKPEN) (70-30) 100 UNIT/ML KwikPen. Tried contacting [facility] to get further confirmation as it shows patient prescription has been discontinued but there's no answer.. Please contact patient at [phone]

## 2023-12-27 NOTE — Telephone Encounter (Signed)
 90 day supply sent in today. Pt informed

## 2024-01-21 ENCOUNTER — Telehealth: Admitting: Physician Assistant

## 2024-01-21 DIAGNOSIS — K047 Periapical abscess without sinus: Secondary | ICD-10-CM | POA: Diagnosis not present

## 2024-01-21 MED ORDER — AMOXICILLIN-POT CLAVULANATE 875-125 MG PO TABS: 1.0000 | ORAL_TABLET | Freq: Two times a day (BID) | ORAL | 0 refills | Status: DC

## 2024-01-21 NOTE — Progress Notes (Signed)
 Virtual Visit Consent   Bradley Howe, you are scheduled for a virtual visit with a [facility] provider today. Just as with appointments in the office, your consent must be obtained to participate. Your consent will be active for this visit and any virtual visit you may have with one of our providers in the next 365 days. If you have a MyChart account, a copy of this consent can be sent to you electronically.  As this is a virtual visit, video technology does not allow for your provider to perform a traditional examination. This may limit your provider's ability to fully assess your condition. If your provider identifies any concerns that need to be evaluated in person or the need to arrange testing (such as labs, EKG, etc.), we will make arrangements to do so. Although advances in technology are sophisticated, we cannot ensure that it will always work on either your end or our end. If the connection with a video visit is poor, the visit may have to be switched to a telephone visit. With either a video or telephone visit, we are not always able to ensure that we have a secure connection.  By engaging in this virtual visit, you consent to the provision of healthcare and authorize for your insurance to be billed (if applicable) for the services provided during this visit. Depending on your insurance coverage, you may receive a charge related to this service.  I need to obtain your verbal consent now. Are you willing to proceed with your visit today? Bradley Howe has provided verbal consent on 01/21/2024 for a virtual visit (video or telephone). Bradley [name], PA-C  Date: 01/21/2024 8:59 AM   Virtual Visit via Video Note   I, Bradley Howe, connected with  Bradley Howe  ([id number], 24-Jun-1965) on 01/21/24 at  8:45 AM EDT by a video-enabled telemedicine application and verified that I am speaking with the correct person using two identifiers.  Location: Patient: Virtual Visit Location Patient:  Home Provider: Virtual Visit Location Provider: Home Office   I discussed the limitations of evaluation and management by telemedicine and the availability of in person appointments. The patient expressed understanding and agreed to proceed.    History of Present Illness: Bradley Howe is a 58 y.o. who identifies as a male who was assigned male at birth, and is being seen today for a dental infection.  HPI: Dental Pain  This is a new problem. The current episode started in the past 7 days (4-5 days). The problem occurs constantly. The problem has been gradually worsening. The pain is moderate. Associated symptoms include facial pain (right side), oral bleeding (only when brushing teeth), sinus pressure (right side) and thermal sensitivity. Pertinent negatives include no difficulty swallowing or fever. Associated symptoms comments: chills. He has tried NSAIDs for the symptoms. The treatment provided no relief.     Problems:  Patient Active Problem List   Diagnosis Date Noted   Hypoglycemia due to type 2 diabetes mellitus (HCC) 07/08/2023   History of cocaine abuse (HCC) 07/08/2023   Erectile dysfunction 04/21/2021   Obesity, unspecified 08/20/2019   Ex-smoker 08/20/2019   GERD (gastroesophageal reflux disease) 08/20/2019   Elevated LFTs 08/20/2019   Angina pectoris (HCC)    Chest pain 09/17/2015   Type 2 diabetes mellitus with hyperglycemia, with long-term current use of insulin  (HCC) 01/15/2013   Hypercholesterolemia 01/15/2013   Renal insufficiency 07/26/2012   Hyponatremia 07/26/2012   Essential (primary) hypertension 07/26/2012   Left inguinal hernia 05/19/2011  Allergies:  Allergies  Allergen Reactions   Metformin  And Related Diarrhea   Medications:  Current Outpatient Medications:    amoxicillin -clavulanate (AUGMENTIN ) 875-125 MG tablet, Take 1 tablet by mouth 2 (two) times daily., Disp: 14 tablet, Rfl: 0   Continuous Glucose Sensor (FREESTYLE LIBRE 2 SENSOR) MISC,  Utilize as recommended to monitor blood glucose.Change every 14 days., Disp: 6 each, Rfl: 3   insulin  isophane & regular human KwikPen (HUMULIN  70/30 KWIKPEN) (70-30) 100 UNIT/ML KwikPen, INJECT 25 UNITS SUBCUTANEOUSLY TWICE DAILY WITH A MEAL, Disp: 45 mL, Rfl: 0   losartan  (COZAAR ) 50 MG tablet, Take 1 tablet (50 mg total) by mouth daily., Disp: 90 tablet, Rfl: 0   pantoprazole  (PROTONIX ) 40 MG tablet, Take 1 tablet (40 mg total) by mouth daily. (Patient not taking: No sig reported), Disp: 30 tablet, Rfl: 0   rosuvastatin  (CRESTOR ) 20 MG tablet, Take 1 tablet by mouth once daily, Disp: 90 tablet, Rfl: 0   Semaglutide , 2 MG/DOSE, 8 MG/3ML SOPN, Inject 2 mg as directed once a week., Disp: 9 mL, Rfl: 1  Observations/Objective: Patient is well-developed, well-nourished in no acute distress.  Resting comfortably at home.  Head is normocephalic, atraumatic.  No labored breathing.  Speech is clear and coherent with logical content.  Patient is alert and oriented at baseline.    Assessment and Plan: 1. Dental infection (Primary) - amoxicillin -clavulanate (AUGMENTIN ) 875-125 MG tablet; Take 1 tablet by mouth 2 (two) times daily.  Dispense: 14 tablet; Refill: 0  - Suspected infection  - Augmentin  prescribed - Can use ice on outside jaw/cheek for swelling - Can also take tylenol  for pain with other medications - Discussed DenTemp putty that can be used to cover a broken tooth - Schedule a follow with a dentist as soon as possible (Can contact [facility] if underinsured or uninsured) - Work note provided  - Seek in person evaluation if symptoms fail to improve or if they worsen   Follow Up Instructions: I discussed the assessment and treatment plan with the patient. The patient was provided an opportunity to ask questions and all were answered. The patient agreed with the plan and demonstrated an understanding of the instructions.  A copy of instructions were sent to the patient  via MyChart unless otherwise noted below.    The patient was advised to call back or seek an in-person evaluation if the symptoms worsen or if the condition fails to improve as anticipated.    Bradley [name], PA-C

## 2024-01-21 NOTE — Patient Instructions (Signed)
 Bradley Howe, thank you for joining [name], PA-C for today's virtual visit.  While this provider is not your primary care provider (PCP), if your PCP is located in our provider database this encounter information will be shared with them immediately following your visit.   A [facility] MyChart account gives you access to today's visit and all your visits, tests, and labs performed at [facility]  click here if you don't have a [facility] MyChart account or go to [email or web address]  Consent: (Patient) Bradley Howe provided verbal consent for this virtual visit at the beginning of the encounter.  Current Medications:  Current Outpatient Medications:    amoxicillin -clavulanate (AUGMENTIN ) 875-125 MG tablet, Take 1 tablet by mouth 2 (two) times daily., Disp: 14 tablet, Rfl: 0   Continuous Glucose Sensor (FREESTYLE LIBRE 2 SENSOR) MISC, Utilize as recommended to monitor blood glucose.Change every 14 days., Disp: 6 each, Rfl: 3   insulin  isophane & regular human KwikPen (HUMULIN  70/30 KWIKPEN) (70-30) 100 UNIT/ML KwikPen, INJECT 25 UNITS SUBCUTANEOUSLY TWICE DAILY WITH A MEAL, Disp: 45 mL, Rfl: 0   losartan  (COZAAR ) 50 MG tablet, Take 1 tablet (50 mg total) by mouth daily., Disp: 90 tablet, Rfl: 0   pantoprazole  (PROTONIX ) 40 MG tablet, Take 1 tablet (40 mg total) by mouth daily. (Patient not taking: No sig reported), Disp: 30 tablet, Rfl: 0   rosuvastatin  (CRESTOR ) 20 MG tablet, Take 1 tablet by mouth once daily, Disp: 90 tablet, Rfl: 0   Semaglutide , 2 MG/DOSE, 8 MG/3ML SOPN, Inject 2 mg as directed once a week., Disp: 9 mL, Rfl: 1   Medications ordered in this encounter:  Meds ordered this encounter  Medications   amoxicillin -clavulanate (AUGMENTIN ) 875-125 MG tablet    Sig: Take 1 tablet by mouth 2 (two) times daily.    Dispense:  14 tablet    Refill:  0    Supervising Provider:   BLAISE ALEENE KIDD [[id number]]     *If you need refills on other medications  prior to your next appointment, please contact your pharmacy*  Follow-Up: Call back or seek an in-person evaluation if the symptoms worsen or if the condition fails to improve as anticipated.  [facility] [phone]  Other Instructions Dental Abscess  A dental abscess is an infection around a tooth that may involve pain, swelling, and a collection of pus, as well as other symptoms. Treatment is important to help with symptoms and to prevent the infection from spreading. The general types of dental abscesses are: Pulpal abscess. This abscess may form from the inner part of the tooth (pulp). Periodontal abscess. This abscess may form from the gum. What are the causes? This condition is caused by a bacterial infection in or around the tooth. It may result from: Severe tooth decay (cavities). Trauma to the tooth, such as a broken or chipped tooth. What increases the risk? This condition is more likely to develop in males. It is also more likely to develop in people who: Have cavities. Have severe gum disease. Eat sugary snacks between meals. Use tobacco products. Have diabetes. Have a weakened disease-fighting system (immune system). Do not brush and care for their teeth regularly. What are the signs or symptoms? Mild symptoms of this condition include: Tenderness. Bad breath. Fever. A bitter taste in the mouth. Pain in and around the infected tooth. Moderate symptoms of this condition include: Swollen neck glands. Chills. Pus drainage. Swelling and redness around the infected tooth, in the mouth, or  in the face. Severe pain in and around the infected tooth. Severe symptoms of this condition include: Difficulty swallowing. Difficulty opening the mouth. Nausea. Vomiting. How is this diagnosed? This condition is diagnosed based on: Your symptoms and your medical and dental history. An examination of the infected tooth. During the exam, your dental care  provider may tap on the infected tooth. You may also need to have X-rays taken of the affected area. How is this treated? This condition is treated by getting rid of the infection. This may be done with: Antibiotic medicines. These may be used in certain situations. Antibacterial mouth rinse. Incision and drainage. This procedure is done by making an incision in the abscess to drain out the pus. Removing pus is the first priority in treating an abscess. A root canal. This may be performed to save the tooth. Your dental care provider accesses the visible part of your tooth (crown) with a drill and removes any infected pulp. Then the space is filled and sealed off. Tooth extraction. The tooth is pulled out if it cannot be saved by other treatment. You may also receive treatment for pain, such as: Acetaminophen  or NSAIDs. Gels that contain a numbing medicine. An injection to block the pain near your nerve. Follow these instructions at home: Medicines Take over-the-counter and prescription medicines only as told by your dental care provider. If you were prescribed an antibiotic, take it as told by your dental care provider. Do not stop taking the antibiotic even if you start to feel better. If you were prescribed a gel that contains a numbing medicine, use it exactly as told in the directions. Do not use these gels for children who are younger than 53 years of age. Use an antibacterial mouth rinse as told by your dental care provider. General instructions  Gargle with a mixture of salt and water 3-4 times a day or as needed. To make salt water, completely dissolve -1 tsp (3-6 g) of salt in 1 cup (237 mL) of warm water. Eat a soft diet while your abscess is healing. Drink enough fluid to keep your urine pale yellow. Do not apply heat to the outside of your mouth. Do not use any products that contain nicotine  or tobacco. These products include cigarettes, chewing tobacco, and vaping devices, such  as e-cigarettes. If you need help quitting, ask your dental care provider. Keep all follow-up visits. This is important. How is this prevented?  Excellent dental home care, which includes brushing your teeth every morning and night with fluoride toothpaste. Floss one time each day. Get regularly scheduled dental cleanings. Consider having a dental sealant applied on teeth that have deep grooves to prevent cavities. Drink fluoridated water regularly. This includes most tap water. Check the label on bottled water to see if it contains fluoride. Reduce or eliminate sugary drinks. Eat healthy meals and snacks. Wear a mouth guard or face shield to protect your teeth while playing sports. Contact a health care provider if: Your pain is worse and is not helped by medicine. You have swelling. You see pus around the tooth. You have a fever or chills. Get help right away if: Your symptoms suddenly get worse. You have a very bad headache. You have problems breathing or swallowing. You have trouble opening your mouth. You have swelling in your neck or around your eye. These symptoms may represent a serious problem that is an emergency. Do not wait to see if the symptoms will go away. Get medical help  right away. Call your local emergency services (911 in the [place] Do not drive yourself to the hospital. Summary A dental abscess is a collection of pus in or around a tooth that results from an infection. A dental abscess may result from severe tooth decay, trauma to the tooth, or severe gum disease around a tooth. Symptoms include severe pain, swelling, redness, and drainage of pus in and around the infected tooth. The first priority in treating a dental abscess is to drain out the pus. Treatment may also involve removing damage inside the tooth (root canal) or extracting the tooth. This information is not intended to replace advice given to you by your health care provider. Make sure you discuss any  questions you have with your health care provider. Document Revised: 07/08/2020 Document Reviewed: 07/08/2020 Elsevier Patient Education  2024 Elsevier Inc.   If you have been instructed to have an in-person evaluation today at a local Urgent Care facility, please use the link below. It will take you to a list of all of our available [facility], including address, phone number and hours of operation. Please do not delay care.  [facility]  If you or a family member do not have a primary care provider, use the link below to schedule a visit and establish care. When you choose a [facility] primary care physician or advanced practice provider, you gain a long-term partner in health. Find a Primary Care Provider  Learn more about [facility]'s in-office and virtual care options:  - Get Care Now

## 2024-01-23 ENCOUNTER — Other Ambulatory Visit: Payer: Self-pay

## 2024-01-23 ENCOUNTER — Other Ambulatory Visit: Payer: Self-pay | Admitting: Family Medicine

## 2024-02-07 ENCOUNTER — Telehealth: Payer: Self-pay | Admitting: Family Medicine

## 2024-02-07 NOTE — Telephone Encounter (Signed)
 Confirmed appt for 9/26

## 2024-02-08 ENCOUNTER — Encounter: Payer: Self-pay | Admitting: Pharmacist

## 2024-02-08 ENCOUNTER — Ambulatory Visit: Attending: Family Medicine | Admitting: Pharmacist

## 2024-02-08 DIAGNOSIS — Z794 Long term (current) use of insulin: Secondary | ICD-10-CM | POA: Diagnosis not present

## 2024-02-08 DIAGNOSIS — E1165 Type 2 diabetes mellitus with hyperglycemia: Secondary | ICD-10-CM | POA: Diagnosis not present

## 2024-02-08 DIAGNOSIS — Z7985 Long-term (current) use of injectable non-insulin antidiabetic drugs: Secondary | ICD-10-CM

## 2024-02-08 LAB — POCT GLYCOSYLATED HEMOGLOBIN (HGB A1C): HbA1c, POC (controlled diabetic range): 6.3 % (ref 0.0–7.0)

## 2024-02-08 MED ORDER — FREESTYLE LIBRE 3 PLUS SENSOR MISC: 6 refills | Status: AC

## 2024-02-08 MED ORDER — HUMULIN 70/30 KWIKPEN (70-30) 100 UNIT/ML ~~LOC~~ SUPN: PEN_INJECTOR | SUBCUTANEOUS | 0 refills | Status: DC

## 2024-02-08 NOTE — Progress Notes (Signed)
 S:    PCP: Dr. Tanda   No chief complaint on file.  Patient arrives in good spirits. Presents for diabetes evaluation, education, and management. Patient was referred and last seen by Primary Care Provider on 11/08/2023. A1c was 7.2 at that visit.    I saw him on 11/09/23 and 12/18/2023. On 12/18/2023, we increased his Ozempic  dose. Today, he is doing well. Tolerates Ozempic  with no NV, abdominal pain. Has been taking the 2 mg dose. He did decrease insulin  to 15 units BID due to hypoglycemia.   PMH: HTN, obesity, T2DM, HLD, and GERD.  Family/Social History:  -Fhx: DM  -Tobacco: never smoker  -Alcohol: endorses occasional use  Insurance coverage/medication affordability: [employer]  Medication adherence reported.   Current diabetes medications include: insulin  NPH 70/30 15 units BID, Ozempic  2 mg weekly  Current hypertension medications include: losartan  50 mg daily Current hyperlipidemia medications include: rosuvastatin  20 mg daily   Patient denies hypoglycemic events.   Patient reported dietary habits:  - Drinks coffee (1 cup), 3-4 glasses of water daily, apple juice (~1 glass several times weekly) - Denies eating excess sweets. Will sometimes eat these to treat relative hypoglycemia.  - Admits to heavy consumption of carbs   Patient-reported exercise habits: none outside of work   Patient denies polyuria (nighttime urination).  Patient denies neuropathy (nerve pain). Patient denies visual changes. Patient reports self foot exams.   O:  Date of Download: 02/08/2024, 30-day report % Time CGM is active: 100% Average Glucose: 122 mg/dL Glucose Management Indicator: NA  Time in Goal:  - Time in range 70-180: 93% - Time above range: 7% - Time below range: 0% Observed patterns:  Lab Results  Component Value Date   HGBA1C 6.3 02/08/2024   There were no vitals filed for this visit.  Lipid Panel     Component Value Date/Time   CHOL 136 07/23/2023 1425   TRIG 213 (H)  07/23/2023 1425   HDL 34 (L) 07/23/2023 1425   CHOLHDL 4.0 07/23/2023 1425   CHOLHDL 3.9 07/10/2023 0839   VLDL 38 07/10/2023 0839   LDLCALC 67 07/23/2023 1425   Clinical Atherosclerotic Cardiovascular Disease (ASCVD): No  The 10-year ASCVD risk score (Arnett DK, et al., 2019) is: 22.9%   Values used to calculate the score:     Age: 58 years     Clincally relevant sex: Male     Is Non-Hispanic African American: Yes     Diabetic: Yes     Tobacco smoker: No     Systolic Blood Pressure: 127 mmHg     Is BP treated: Yes     HDL Cholesterol: 34 mg/dL     Total Cholesterol: 136 mg/dL   A/P:  Diabetes longstanding currently controlled with A1c of 6.3% today. Commended him for this. AGP from his Libre CGM continues to show improvement. Time in range over the last 30 days is 93%! He is not currently symptomatic at this time. Patient is able to verbalize appropriate hypoglycemia management plan. Medication adherence appears appropriate.  -Continue Ozempic  2 mg weekly.  -Continue 70/30 insulin  15 units BID.  -Extensively discussed pathophysiology of diabetes, recommended lifestyle interventions, dietary effects on blood sugar control -Counseled on s/sx of and management of hypoglycemia -Next A1C anticipated 04/2024.   Written patient instructions provided.  Total time in face to face counseling 30 minutes.   Follow up Pharmacist Clinic Visit in 1 month.     Herlene Fleeta Morris, PharmD, BCACP, CPP Clinical Pharmacist  [facility] [phone]

## 2024-02-12 ENCOUNTER — Encounter: Payer: Self-pay | Admitting: Internal Medicine

## 2024-02-13 ENCOUNTER — Other Ambulatory Visit: Payer: Self-pay | Admitting: Pharmacist

## 2024-02-13 MED ORDER — HUMULIN 70/30 KWIKPEN (70-30) 100 UNIT/ML ~~LOC~~ SUPN: 25.0000 [IU] | PEN_INJECTOR | Freq: Two times a day (BID) | SUBCUTANEOUS | 6 refills | Status: DC

## 2024-02-18 ENCOUNTER — Other Ambulatory Visit: Payer: Self-pay | Admitting: Pharmacist

## 2024-02-18 ENCOUNTER — Telehealth: Payer: Self-pay

## 2024-02-18 MED ORDER — HUMULIN 70/30 KWIKPEN (70-30) 100 UNIT/ML ~~LOC~~ SUPN: 25.0000 [IU] | PEN_INJECTOR | Freq: Two times a day (BID) | SUBCUTANEOUS | 6 refills | Status: AC

## 2024-02-18 NOTE — Telephone Encounter (Signed)
 Copied from CRM #[id number]. Topic: Clinical - Prescription Issue >> Feb 18, 2024 10:23 AM Amy B wrote: Reason for CRM: Patient states pharmacy has been trying to contact provider regarding patient's Humulin .  They received two different prescriptions of Humulin  and need to know which one to fill.

## 2024-02-20 NOTE — Telephone Encounter (Signed)
 RX on hold until it's time for a refill, per pharmacy

## 2024-02-23 ENCOUNTER — Other Ambulatory Visit: Payer: Self-pay | Admitting: Family Medicine

## 2024-02-23 DIAGNOSIS — E785 Hyperlipidemia, unspecified: Secondary | ICD-10-CM

## 2024-04-16 ENCOUNTER — Telehealth: Admitting: Physician Assistant

## 2024-04-16 DIAGNOSIS — K047 Periapical abscess without sinus: Secondary | ICD-10-CM

## 2024-04-16 DIAGNOSIS — K0889 Other specified disorders of teeth and supporting structures: Secondary | ICD-10-CM | POA: Diagnosis not present

## 2024-04-16 MED ORDER — DICLOFENAC SODIUM 75 MG PO TBEC: 75.0000 mg | DELAYED_RELEASE_TABLET | Freq: Two times a day (BID) | ORAL | 0 refills | Status: AC

## 2024-04-16 MED ORDER — AMOXICILLIN 500 MG PO CAPS: 500.0000 mg | ORAL_CAPSULE | Freq: Three times a day (TID) | ORAL | 0 refills | Status: AC

## 2024-04-16 NOTE — Patient Instructions (Signed)
 Bradley Howe, thank you for joining [name], PA-C for today's virtual visit.  While this provider is not your primary care provider (PCP), if your PCP is located in our provider database this encounter information will be shared with them immediately following your visit.   A [facility] MyChart account gives you access to today's visit and all your visits, tests, and labs performed at [facility]  click here if you don't have a [facility] MyChart account or go to [email or web address]  Consent: (Patient) Bradley Howe provided verbal consent for this virtual visit at the beginning of the encounter.  Current Medications:  Current Outpatient Medications:    amoxicillin  (AMOXIL ) 500 MG capsule, Take 1 capsule (500 mg total) by mouth 3 (three) times daily for 7 days., Disp: 21 capsule, Rfl: 0   diclofenac  (VOLTAREN ) 75 MG EC tablet, Take 1 tablet (75 mg total) by mouth 2 (two) times daily. Take with food, Disp: 30 tablet, Rfl: 0   Continuous Glucose Sensor (FREESTYLE LIBRE 3 PLUS SENSOR) MISC, Change sensor every 15 days. Use to check blood sugar continuously., Disp: 2 each, Rfl: 6   insulin  isophane & regular human KwikPen (HUMULIN  70/30 KWIKPEN) (70-30) 100 UNIT/ML KwikPen, Inject 25 Units into the skin 2 (two) times daily., Disp: 15 mL, Rfl: 6   losartan  (COZAAR ) 50 MG tablet, Take 1 tablet (50 mg total) by mouth daily., Disp: 90 tablet, Rfl: 0   pantoprazole  (PROTONIX ) 40 MG tablet, Take 1 tablet (40 mg total) by mouth daily. (Patient not taking: No sig reported), Disp: 30 tablet, Rfl: 0   rosuvastatin  (CRESTOR ) 20 MG tablet, Take 1 tablet by mouth once daily, Disp: 90 tablet, Rfl: 0   Semaglutide , 2 MG/DOSE, 8 MG/3ML SOPN, Inject 2 mg as directed once a week., Disp: 9 mL, Rfl: 1   Medications ordered in this encounter:  Meds ordered this encounter  Medications   amoxicillin  (AMOXIL ) 500 MG capsule    Sig: Take 1 capsule (500 mg total) by mouth 3 (three) times daily  for 7 days.    Dispense:  21 capsule    Refill:  0    Supervising Provider:   BLAISE ALEENE KIDD [id number]   diclofenac  (VOLTAREN ) 75 MG EC tablet    Sig: Take 1 tablet (75 mg total) by mouth 2 (two) times daily. Take with food    Dispense:  30 tablet    Refill:  0    Supervising Provider:   LAMPTEY, PHILIP O [[id number]]     *If you need refills on other medications prior to your next appointment, please contact your pharmacy*  Follow-Up: Call back or seek an in-person evaluation if the symptoms worsen or if the condition fails to improve as anticipated.  [facility] [phone]  Other Instructions Stay hydrated with clear liquids Start medicines as prescribed. Follow up with dentist as scheduled next week. Schedule a virtual appointment or follow up at an urgent care clinic if symptoms don't improve.    If you have been instructed to have an in-person evaluation today at a local Urgent Care facility, please use the link below. It will take you to a list of all of our available [facility], including address, phone number and hours of operation. Please do not delay care.  [facility]  If you or a family member do not have a primary care provider, use the link below to schedule a visit and establish care. When you choose a  [facility] primary care physician or advanced practice provider, you gain a long-term partner in health. Find a Primary Care Provider  Learn more about [facility]'s in-office and virtual care options: [facility] - Get Care Now

## 2024-04-16 NOTE — Progress Notes (Signed)
 Virtual Visit Consent   Bradley Howe, you are scheduled for a virtual visit with a [facility] provider today. Just as with appointments in the office, your consent must be obtained to participate. Your consent will be active for this visit and any virtual visit you may have with one of our providers in the next 365 days. If you have a MyChart account, a copy of this consent can be sent to you electronically.  As this is a virtual visit, video technology does not allow for your provider to perform a traditional examination. This may limit your provider's ability to fully assess your condition. If your provider identifies any concerns that need to be evaluated in person or the need to arrange testing (such as labs, EKG, etc.), we will make arrangements to do so. Although advances in technology are sophisticated, we cannot ensure that it will always work on either your end or our end. If the connection with a video visit is poor, the visit may have to be switched to a telephone visit. With either a video or telephone visit, we are not always able to ensure that we have a secure connection.  By engaging in this virtual visit, you consent to the provision of healthcare and authorize for your insurance to be billed (if applicable) for the services provided during this visit. Depending on your insurance coverage, you may receive a charge related to this service.  I need to obtain your verbal consent now. Are you willing to proceed with your visit today? Bradley Howe has provided verbal consent on 04/16/2024 for a virtual visit (video or telephone). Bradley Borg, PA-C  Date: 04/16/2024 6:14 PM   Virtual Visit via Video Note   I, Bradley Howe, connected with  Bradley Howe  ([id number], [birth year]) on 04/16/24 at  6:00 PM EST by a video-enabled telemedicine application and verified that I am speaking with the correct person using two identifiers.  Location: Patient: Virtual Visit Location Patient:  Home Provider: Virtual Visit Location Provider: Home Office   I discussed the limitations of evaluation and management by telemedicine and the availability of in person appointments. The patient expressed understanding and agreed to proceed.    History of Present Illness: Bradley Howe is a 58 y.o. who identifies as a male who was assigned male at birth, and is being seen today for dental pain.  HPI: 58y/o M presents for a telehealth video visit for c/o  dental pain  x few days. No fever, but noticed slight swelling over the cheek and referred pain to the right ear. Dental appointment scheduled next week. Has been taking Ibuprofen  with only minimal relief.   Dental Pain     Problems:  Patient Active Problem List   Diagnosis Date Noted   Hypoglycemia due to type 2 diabetes mellitus (HCC) 07/08/2023   History of cocaine abuse (HCC) 07/08/2023   Erectile dysfunction 04/21/2021   Obesity, unspecified 08/20/2019   Ex-smoker 08/20/2019   GERD (gastroesophageal reflux disease) 08/20/2019   Elevated LFTs 08/20/2019   Angina pectoris    Chest pain 09/17/2015   Type 2 diabetes mellitus with hyperglycemia, with long-term current use of insulin  (HCC) 01/15/2013   Hypercholesterolemia 01/15/2013   Renal insufficiency 07/26/2012   Hyponatremia 07/26/2012   Essential (primary) hypertension 07/26/2012   Left inguinal hernia 05/19/2011    Allergies:  Allergies  Allergen Reactions   Metformin  And Related Diarrhea   Medications:  Current Outpatient Medications:    amoxicillin  (AMOXIL ) 500 MG capsule, Take 1  capsule (500 mg total) by mouth 3 (three) times daily for 7 days., Disp: 21 capsule, Rfl: 0   diclofenac  (VOLTAREN ) 75 MG EC tablet, Take 1 tablet (75 mg total) by mouth 2 (two) times daily. Take with food, Disp: 30 tablet, Rfl: 0   Continuous Glucose Sensor (FREESTYLE LIBRE 3 PLUS SENSOR) MISC, Change sensor every 15 days. Use to check blood sugar continuously., Disp: 2 each, Rfl: 6    insulin  isophane & regular human KwikPen (HUMULIN  70/30 KWIKPEN) (70-30) 100 UNIT/ML KwikPen, Inject 25 Units into the skin 2 (two) times daily., Disp: 15 mL, Rfl: 6   losartan  (COZAAR ) 50 MG tablet, Take 1 tablet (50 mg total) by mouth daily., Disp: 90 tablet, Rfl: 0   pantoprazole  (PROTONIX ) 40 MG tablet, Take 1 tablet (40 mg total) by mouth daily. (Patient not taking: No sig reported), Disp: 30 tablet, Rfl: 0   rosuvastatin  (CRESTOR ) 20 MG tablet, Take 1 tablet by mouth once daily, Disp: 90 tablet, Rfl: 0   Semaglutide , 2 MG/DOSE, 8 MG/3ML SOPN, Inject 2 mg as directed once a week., Disp: 9 mL, Rfl: 1  Observations/Objective: Patient is well-developed, well-nourished in no acute distress.  Resting comfortably  at home.  Head is normocephalic, atraumatic.  No labored breathing.  Speech is clear and coherent with logical content.  Patient is alert and oriented at baseline.    Assessment and Plan: 1. Pain, dental (Primary) - diclofenac  (VOLTAREN ) 75 MG EC tablet; Take 1 tablet (75 mg total) by mouth 2 (two) times daily. Take with food  Dispense: 30 tablet; Refill: 0  2. Dental infection - amoxicillin  (AMOXIL ) 500 MG capsule; Take 1 capsule (500 mg total) by mouth 3 (three) times daily for 7 days.  Dispense: 21 capsule; Refill: 0  Stay hydrated with clear liquids Start medicines as prescribed. Follow up with dentist as scheduled next week. Schedule a virtual appointment or follow up at an urgent care clinic if symptoms don't improve.  Pt verbalized understanding and in agreement.    Follow Up Instructions: I discussed the assessment and treatment plan with the patient. The patient was provided an opportunity to ask questions and all were answered. The patient agreed with the plan and demonstrated an understanding of the instructions.  A copy of instructions were sent to the patient via MyChart unless otherwise noted below.   Patient has requested to receive PHI (AVS, Work Notes, etc)  pertaining to this video visit through e-mail as they are currently without active MyChart. They have voiced understand that email is not considered secure and their health information could be viewed by someone other than the patient.   The patient was advised to call back or seek an in-person evaluation if the symptoms worsen or if the condition fails to improve as anticipated.    Graylin Sperling, PA-C

## 2024-05-16 ENCOUNTER — Ambulatory Visit: Admitting: Pharmacist

## 2024-05-22 ENCOUNTER — Other Ambulatory Visit: Payer: Self-pay | Admitting: Family Medicine

## 2024-05-22 DIAGNOSIS — E785 Hyperlipidemia, unspecified: Secondary | ICD-10-CM

## 2024-05-24 ENCOUNTER — Other Ambulatory Visit: Payer: Self-pay | Admitting: Family Medicine
